# Patient Record
Sex: Female | Born: 1955 | Race: Black or African American | Hispanic: No | Marital: Single | Smoking: Never smoker
Health system: Southern US, Community
[De-identification: ages and names within clinical notes are randomized; demographics above are authoritative.]

## PROBLEM LIST (undated history)

## (undated) DIAGNOSIS — B999 Unspecified infectious disease: Secondary | ICD-10-CM

## (undated) DIAGNOSIS — I10 Essential (primary) hypertension: Secondary | ICD-10-CM

## (undated) DIAGNOSIS — I82409 Acute embolism and thrombosis of unspecified deep veins of unspecified lower extremity: Secondary | ICD-10-CM

## (undated) DIAGNOSIS — Z8269 Family history of other diseases of the musculoskeletal system and connective tissue: Secondary | ICD-10-CM

## (undated) DIAGNOSIS — M1990 Unspecified osteoarthritis, unspecified site: Secondary | ICD-10-CM

## (undated) DIAGNOSIS — E7800 Pure hypercholesterolemia, unspecified: Secondary | ICD-10-CM

## (undated) DIAGNOSIS — G43909 Migraine, unspecified, not intractable, without status migrainosus: Secondary | ICD-10-CM

## (undated) HISTORY — PX: HIP SURGERY: SHX245

## (undated) HISTORY — PX: TOTAL KNEE ARTHROPLASTY: SHX125

---

## 2011-12-17 DIAGNOSIS — M069 Rheumatoid arthritis, unspecified: Secondary | ICD-10-CM | POA: Insufficient documentation

## 2011-12-17 DIAGNOSIS — M353 Polymyalgia rheumatica: Secondary | ICD-10-CM | POA: Insufficient documentation

## 2012-02-14 DIAGNOSIS — J45909 Unspecified asthma, uncomplicated: Secondary | ICD-10-CM | POA: Insufficient documentation

## 2013-11-13 DIAGNOSIS — M329 Systemic lupus erythematosus, unspecified: Secondary | ICD-10-CM | POA: Insufficient documentation

## 2013-12-06 DIAGNOSIS — R531 Weakness: Secondary | ICD-10-CM | POA: Insufficient documentation

## 2013-12-06 DIAGNOSIS — E876 Hypokalemia: Secondary | ICD-10-CM | POA: Insufficient documentation

## 2014-04-09 DIAGNOSIS — Z8619 Personal history of other infectious and parasitic diseases: Secondary | ICD-10-CM | POA: Insufficient documentation

## 2014-05-31 DIAGNOSIS — Z96659 Presence of unspecified artificial knee joint: Secondary | ICD-10-CM

## 2014-05-31 DIAGNOSIS — T8484XA Pain due to internal orthopedic prosthetic devices, implants and grafts, initial encounter: Secondary | ICD-10-CM | POA: Insufficient documentation

## 2014-06-03 DIAGNOSIS — Z96652 Presence of left artificial knee joint: Secondary | ICD-10-CM | POA: Insufficient documentation

## 2014-08-12 DIAGNOSIS — Z86718 Personal history of other venous thrombosis and embolism: Secondary | ICD-10-CM | POA: Insufficient documentation

## 2014-08-12 DIAGNOSIS — L0390 Cellulitis, unspecified: Secondary | ICD-10-CM | POA: Insufficient documentation

## 2014-09-04 DIAGNOSIS — D638 Anemia in other chronic diseases classified elsewhere: Secondary | ICD-10-CM | POA: Insufficient documentation

## 2016-01-12 ENCOUNTER — Other Ambulatory Visit: Payer: Self-pay | Admitting: Family Medicine

## 2016-01-12 DIAGNOSIS — M79605 Pain in left leg: Secondary | ICD-10-CM

## 2016-01-12 DIAGNOSIS — Z1231 Encounter for screening mammogram for malignant neoplasm of breast: Secondary | ICD-10-CM

## 2016-01-12 DIAGNOSIS — M79604 Pain in right leg: Secondary | ICD-10-CM

## 2016-01-14 ENCOUNTER — Ambulatory Visit
Admission: RE | Admit: 2016-01-14 | Discharge: 2016-01-14 | Disposition: A | Discharge status: Home / Self Care | Payer: Medicare Other | Source: Ambulatory Visit | Attending: Family Medicine | Admitting: Family Medicine

## 2016-01-14 DIAGNOSIS — Z1231 Encounter for screening mammogram for malignant neoplasm of breast: Secondary | ICD-10-CM

## 2016-01-14 DIAGNOSIS — M79604 Pain in right leg: Secondary | ICD-10-CM

## 2016-01-14 DIAGNOSIS — M79605 Pain in left leg: Secondary | ICD-10-CM

## 2016-01-29 ENCOUNTER — Ambulatory Visit
Admission: RE | Admit: 2016-01-29 | Discharge: 2016-01-29 | Disposition: A | Discharge status: Home / Self Care | Payer: Medicare Other | Source: Ambulatory Visit | Attending: Family Medicine | Admitting: Family Medicine

## 2016-01-29 DIAGNOSIS — Z1231 Encounter for screening mammogram for malignant neoplasm of breast: Secondary | ICD-10-CM

## 2016-02-10 ENCOUNTER — Encounter (HOSPITAL_COMMUNITY): Payer: Self-pay | Admitting: Emergency Medicine

## 2016-02-10 DIAGNOSIS — Z8619 Personal history of other infectious and parasitic diseases: Secondary | ICD-10-CM | POA: Insufficient documentation

## 2016-02-10 DIAGNOSIS — I10 Essential (primary) hypertension: Secondary | ICD-10-CM | POA: Diagnosis not present

## 2016-02-10 DIAGNOSIS — G43909 Migraine, unspecified, not intractable, without status migrainosus: Secondary | ICD-10-CM | POA: Insufficient documentation

## 2016-02-10 DIAGNOSIS — Z79899 Other long term (current) drug therapy: Secondary | ICD-10-CM | POA: Diagnosis not present

## 2016-02-10 DIAGNOSIS — Z7982 Long term (current) use of aspirin: Secondary | ICD-10-CM | POA: Insufficient documentation

## 2016-02-10 DIAGNOSIS — R600 Localized edema: Secondary | ICD-10-CM | POA: Insufficient documentation

## 2016-02-10 DIAGNOSIS — E7800 Pure hypercholesterolemia, unspecified: Secondary | ICD-10-CM | POA: Diagnosis not present

## 2016-02-10 DIAGNOSIS — M1990 Unspecified osteoarthritis, unspecified site: Secondary | ICD-10-CM | POA: Insufficient documentation

## 2016-02-10 DIAGNOSIS — Z79891 Long term (current) use of opiate analgesic: Secondary | ICD-10-CM | POA: Insufficient documentation

## 2016-02-10 DIAGNOSIS — M79605 Pain in left leg: Secondary | ICD-10-CM | POA: Diagnosis present

## 2016-02-10 DIAGNOSIS — Z86718 Personal history of other venous thrombosis and embolism: Secondary | ICD-10-CM | POA: Insufficient documentation

## 2016-02-10 NOTE — ED Notes (Signed)
Pt c/o L leg pain, hx of infection in leg, arthritis, knee replacement, DVTs in leg. Pt legs are swollen and warm to touch, bruising noted as well. Pt ambulatory, in NAD. Denies CP/SOB

## 2016-02-11 ENCOUNTER — Emergency Department (HOSPITAL_COMMUNITY)
Admission: EM | Admit: 2016-02-11 | Discharge: 2016-02-11 | Disposition: A | Discharge status: Home / Self Care | Payer: Medicare Other | Attending: Emergency Medicine | Admitting: Emergency Medicine

## 2016-02-11 DIAGNOSIS — R600 Localized edema: Secondary | ICD-10-CM

## 2016-02-11 HISTORY — DX: Unspecified osteoarthritis, unspecified site: M19.90

## 2016-02-11 HISTORY — DX: Family history of other diseases of the musculoskeletal system and connective tissue: Z82.69

## 2016-02-11 HISTORY — DX: Essential (primary) hypertension: I10

## 2016-02-11 HISTORY — DX: Migraine, unspecified, not intractable, without status migrainosus: G43.909

## 2016-02-11 HISTORY — DX: Unspecified infectious disease: B99.9

## 2016-02-11 HISTORY — DX: Acute embolism and thrombosis of unspecified deep veins of unspecified lower extremity: I82.409

## 2016-02-11 HISTORY — DX: Pure hypercholesterolemia, unspecified: E78.00

## 2016-02-11 MED ORDER — FUROSEMIDE 20 MG PO TABS: 20.0000 mg | ORAL_TABLET | Freq: Every day | ORAL | Status: DC

## 2016-02-11 NOTE — ED Provider Notes (Signed)
CSN: [id number]     Arrival date & time 02/10/16  2315 History   By signing my name below, I, Diana Sanchez, attest that this documentation has been prepared under the direction and in the presence of Geoffery Lyons, MD.  Electronically Signed: Arlan Sanchez, ED Scribe. 02/11/2016. 3:06 AM.   Chief Complaint  Patient presents with  . Leg Pain   Patient is a 60 y.o. female presenting with leg pain. The history is provided by the patient. No language interpreter was used.  Leg Pain Location:  Leg Time since incident:  1 week Injury: no   Leg location:  L leg and L lower leg Pain details:    Radiates to:  Does not radiate   Severity:  Moderate   Onset quality:  Gradual   Duration:  1 week   Timing:  Constant   Progression:  Worsening Chronicity:  Chronic Relieved by:  Nothing Associated symptoms: no fever     HPI Comments: Diana Sanchez is a 60 y.o. female with a PMHx of DVT, arthritis, and HTN who presents to the Emergency Department complaining of constant, ongoing L leg pain with associated swelling that is chronic is nature but worsened in the last week. Pt also reports discoloration to the L lower extremity. Discomfort to LLE is exacerbated with pressure to leg. No alleviating factors at this time. [name] is currently established with a pain clinic but denies any improvement with current break through pain regimen. No recent fever, chills, chest pain, or shortness of breath. Pt recently had an ultrasound last week without any abnormal findings. PSHx includes bilateral total knee arthroplasty performed in [place], [place] in 2014.  PCP: No primary care provider on file.    Past Medical History  Diagnosis Date  . DVT of lower limb, acute (HCC)   . Arthritis   . FH: total knee replacement   . Infection     in knee  . Hypercholesteremia   . Migraines   . Hypertension    Past Surgical History  Procedure Laterality Date  . Total knee arthroplasty    . Hip surgery    .  Cesarean section     No family history on file. Social History  Substance Use Topics  . Smoking status: Never Smoker   . Smokeless tobacco: None  . Alcohol Use: No   OB History    No data available     Review of Systems  Constitutional: Negative for fever and chills.  Respiratory: Negative for shortness of breath.   Cardiovascular: Positive for leg swelling. Negative for chest pain.  Gastrointestinal: Negative for nausea, vomiting, abdominal pain and diarrhea.  Musculoskeletal: Positive for arthralgias.  Neurological: Negative for weakness, numbness and headaches.  Psychiatric/Behavioral: Negative for confusion.  All other systems reviewed and are negative.     Allergies  Tramadol  Home Medications   Prior to Admission medications   Medication Sig Start Date End Date Taking? Authorizing Provider  amLODipine (NORVASC) 5 MG tablet Take 5 mg by mouth daily.   Yes Historical Provider, MD  aspirin EC 81 MG tablet Take 81 mg by mouth daily.   Yes Historical Provider, MD  cyclobenzaprine (FLEXERIL) 10 MG tablet Take 10 mg by mouth 3 (three) times daily as needed for muscle spasms.   Yes Historical Provider, MD  oxyCODONE-acetaminophen (PERCOCET/ROXICET) 5-325 MG tablet Take 1 tablet by mouth 3 (three) times daily.   Yes Historical Provider, MD  pravastatin (PRAVACHOL) 40 MG tablet Take 40 mg  by mouth daily.   Yes Historical Provider, MD  rizatriptan (MAXALT) 10 MG tablet Take 10 mg by mouth as needed for migraine. May repeat in 2 hours if needed   Yes Historical Provider, MD   Triage Vitals: BP 145/82 mmHg  Pulse 92  Temp(Src) 98 F (36.7 C) (Oral)  Resp 18  SpO2 100%   Physical Exam  Constitutional: She is oriented to person, place, and time. She appears well-developed and well-nourished. No distress.  HENT:  Head: Normocephalic and atraumatic.  Eyes: EOM are normal.  Neck: Normal range of motion.  Cardiovascular: Normal rate, regular rhythm and normal heart sounds.    Pulmonary/Chest: Effort normal and breath sounds normal.  Abdominal: Soft. She exhibits no distension. There is no tenderness.  Musculoskeletal: Normal range of motion. She exhibits edema.  2 plus pitting edema of bilateral lower extremities. DP and PT pulses are easily palpable.  Neurological: She is alert and oriented to person, place, and time.  Skin: Skin is warm and dry.  Psychiatric: She has a normal mood and affect. Judgment normal.  Nursing note and vitals reviewed.   ED Course  Procedures (including critical care time)  DIAGNOSTIC STUDIES: Oxygen Saturation is 100% on RA, Normal by my interpretation.    COORDINATION OF CARE: 2:56 AM-Discussed treatment plan with pt at bedside and pt agreed to plan.     Labs Review Labs Reviewed - No data to display  Imaging Review No results found. I have personally reviewed and evaluated these images and lab results as part of my medical decision-making.   EKG Interpretation None      MDM   Final diagnoses:  None    Patient presents with complaints of leg swelling and pain. She has a history of an infected prosthetic knee that required removal and washout. She reports she became septic during that process and had a lengthy hospitalization. She presents now with increased swelling to her left greater than right leg. She recently had an ultrasound performed for similar complaints which showed no evidence for DVT. She has 2+ pitting edema of her legs and I suspect a dependent edema. She is not having any shortness of breath and her lungs are clear. She will be treated with a low dose of Lasix and follow-up with her primary Dr./orthopedist.  I personally performed the services described in this documentation, which was scribed in my presence. The recorded information has been reviewed and is accurate.       Geoffery Lyons, MD 02/11/16 [phone]

## 2016-02-11 NOTE — Discharge Instructions (Signed)
Lasix as prescribed.  Please follow-up with your primary Dr. and orthopedic surgeon in the next week.   Edema Edema is an abnormal buildup of fluids in your bodytissues. Edema is somewhatdependent on gravity to pull the fluid to the lowest place in your body. That makes the condition more common in the legs and thighs (lower extremities). Painless swelling of the feet and ankles is common and becomes more likely as you get older. It is also common in looser tissues, like around your eyes.  When the affected area is squeezed, the fluid may move out of that spot and leave a dent for a few moments. This dent is called pitting.  CAUSES  There are many possible causes of edema. Eating too much salt and being on your feet or sitting for a long time can cause edema in your legs and ankles. Hot weather may make edema worse. Common medical causes of edema include:  Heart failure.  Liver disease.  Kidney disease.  Weak blood vessels in your legs.  Cancer.  An injury.  Pregnancy.  Some medications.  Obesity. SYMPTOMS  Edema is usually painless.Your skin may look swollen or shiny.  DIAGNOSIS  Your health care provider may be able to diagnose edema by asking about your medical history and doing a physical exam. You may need to have tests such as X-rays, an electrocardiogram, or blood tests to check for medical conditions that may cause edema.  TREATMENT  Edema treatment depends on the cause. If you have heart, liver, or kidney disease, you need the treatment appropriate for these conditions. General treatment may include:  Elevation of the affected body part above the level of your heart.  Compression of the affected body part. Pressure from elastic bandages or support stockings squeezes the tissues and forces fluid back into the blood vessels. This keeps fluid from entering the tissues.  Restriction of fluid and salt intake.  Use of a water pill (diuretic). These medications are  appropriate only for some types of edema. They pull fluid out of your body and make you urinate more often. This gets rid of fluid and reduces swelling, but diuretics can have side effects. Only use diuretics as directed by your health care provider. HOME CARE INSTRUCTIONS   Keep the affected body part above the level of your heart when you are lying down.   Do not sit still or stand for prolonged periods.   Do not put anything directly under your knees when lying down.  Do not wear constricting clothing or garters on your upper legs.   Exercise your legs to work the fluid back into your blood vessels. This may help the swelling go down.   Wear elastic bandages or support stockings to reduce ankle swelling as directed by your health care provider.   Eat a low-salt diet to reduce fluid if your health care provider recommends it.   Only take medicines as directed by your health care provider. SEEK MEDICAL CARE IF:   Your edema is not responding to treatment.  You have heart, liver, or kidney disease and notice symptoms of edema.  You have edema in your legs that does not improve after elevating them.   You have sudden and unexplained weight gain. SEEK IMMEDIATE MEDICAL CARE IF:   You develop shortness of breath or chest pain.   You cannot breathe when you lie down.  You develop pain, redness, or warmth in the swollen areas.   You have heart, liver, or kidney  disease and suddenly get edema.  You have a fever and your symptoms suddenly get worse. MAKE SURE YOU:   Understand these instructions.  Will watch your condition.  Will get help right away if you are not doing well or get worse.   This information is not intended to replace advice given to you by your health care provider. Make sure you discuss any questions you have with your health care provider.   Document Released: 11/29/2005 Document Revised: 12/20/2014 Document Reviewed: 09/21/2013 Elsevier  Interactive Patient Education [employer]2016 Elsevier Inc.

## 2016-07-30 DIAGNOSIS — H269 Unspecified cataract: Secondary | ICD-10-CM | POA: Insufficient documentation

## 2016-10-19 DIAGNOSIS — M170 Bilateral primary osteoarthritis of knee: Secondary | ICD-10-CM | POA: Insufficient documentation

## 2016-10-19 DIAGNOSIS — M5136 Other intervertebral disc degeneration, lumbar region: Secondary | ICD-10-CM | POA: Insufficient documentation

## 2016-10-19 DIAGNOSIS — M47816 Spondylosis without myelopathy or radiculopathy, lumbar region: Secondary | ICD-10-CM | POA: Insufficient documentation

## 2016-10-19 DIAGNOSIS — G894 Chronic pain syndrome: Secondary | ICD-10-CM | POA: Insufficient documentation

## 2016-10-19 DIAGNOSIS — M130 Polyarthritis, unspecified: Secondary | ICD-10-CM | POA: Insufficient documentation

## 2017-01-10 ENCOUNTER — Other Ambulatory Visit: Payer: Self-pay | Admitting: Family Medicine

## 2017-01-10 DIAGNOSIS — Z1231 Encounter for screening mammogram for malignant neoplasm of breast: Secondary | ICD-10-CM

## 2017-02-04 ENCOUNTER — Ambulatory Visit
Admission: RE | Admit: 2017-02-04 | Discharge: 2017-02-04 | Disposition: A | Discharge status: Home / Self Care | Payer: Medicare HMO | Source: Ambulatory Visit | Attending: Family Medicine | Admitting: Family Medicine

## 2017-02-04 DIAGNOSIS — Z1231 Encounter for screening mammogram for malignant neoplasm of breast: Secondary | ICD-10-CM

## 2017-04-13 DIAGNOSIS — G43909 Migraine, unspecified, not intractable, without status migrainosus: Secondary | ICD-10-CM | POA: Insufficient documentation

## 2018-03-03 ENCOUNTER — Other Ambulatory Visit: Payer: Self-pay | Admitting: Family Medicine

## 2018-03-03 DIAGNOSIS — Z139 Encounter for screening, unspecified: Secondary | ICD-10-CM

## 2018-03-22 ENCOUNTER — Ambulatory Visit: Payer: Medicare HMO

## 2018-03-24 ENCOUNTER — Ambulatory Visit: Payer: Medicare HMO

## 2018-04-26 ENCOUNTER — Ambulatory Visit: Payer: Medicare HMO

## 2018-06-13 DIAGNOSIS — Z8614 Personal history of Methicillin resistant Staphylococcus aureus infection: Secondary | ICD-10-CM | POA: Insufficient documentation

## 2018-06-20 ENCOUNTER — Ambulatory Visit: Payer: Medicare HMO

## 2018-06-26 DIAGNOSIS — F0631 Mood disorder due to known physiological condition with depressive features: Secondary | ICD-10-CM | POA: Insufficient documentation

## 2018-08-01 DIAGNOSIS — H9193 Unspecified hearing loss, bilateral: Secondary | ICD-10-CM | POA: Insufficient documentation

## 2019-01-03 ENCOUNTER — Ambulatory Visit: Payer: Medicare HMO | Admitting: Podiatry

## 2019-01-10 DIAGNOSIS — L97929 Non-pressure chronic ulcer of unspecified part of left lower leg with unspecified severity: Secondary | ICD-10-CM

## 2019-01-10 DIAGNOSIS — I83892 Varicose veins of left lower extremities with other complications: Secondary | ICD-10-CM | POA: Insufficient documentation

## 2019-01-10 DIAGNOSIS — I83029 Varicose veins of left lower extremity with ulcer of unspecified site: Secondary | ICD-10-CM | POA: Insufficient documentation

## 2019-01-10 DIAGNOSIS — R609 Edema, unspecified: Secondary | ICD-10-CM

## 2019-01-26 ENCOUNTER — Encounter

## 2019-01-26 ENCOUNTER — Other Ambulatory Visit: Payer: Self-pay

## 2019-01-26 ENCOUNTER — Ambulatory Visit (INDEPENDENT_AMBULATORY_CARE_PROVIDER_SITE_OTHER): Payer: Medicare HMO | Admitting: Podiatry

## 2019-01-26 ENCOUNTER — Encounter: Payer: Self-pay | Admitting: Podiatry

## 2019-01-26 VITALS — BP 95/65 | HR 87

## 2019-01-26 DIAGNOSIS — K219 Gastro-esophageal reflux disease without esophagitis: Secondary | ICD-10-CM | POA: Insufficient documentation

## 2019-01-26 DIAGNOSIS — M79674 Pain in right toe(s): Secondary | ICD-10-CM

## 2019-01-26 DIAGNOSIS — M2041 Other hammer toe(s) (acquired), right foot: Secondary | ICD-10-CM

## 2019-01-26 DIAGNOSIS — I872 Venous insufficiency (chronic) (peripheral): Secondary | ICD-10-CM | POA: Diagnosis not present

## 2019-01-26 DIAGNOSIS — L84 Corns and callosities: Secondary | ICD-10-CM

## 2019-01-26 DIAGNOSIS — M2042 Other hammer toe(s) (acquired), left foot: Secondary | ICD-10-CM

## 2019-01-26 DIAGNOSIS — B351 Tinea unguium: Secondary | ICD-10-CM

## 2019-01-26 DIAGNOSIS — M79675 Pain in left toe(s): Secondary | ICD-10-CM | POA: Diagnosis not present

## 2019-01-26 DIAGNOSIS — F329 Major depressive disorder, single episode, unspecified: Secondary | ICD-10-CM | POA: Insufficient documentation

## 2019-01-26 DIAGNOSIS — F32A Depression, unspecified: Secondary | ICD-10-CM | POA: Insufficient documentation

## 2019-01-26 NOTE — Patient Instructions (Signed)

## 2019-01-26 NOTE — Progress Notes (Signed)
Subjective: Diana Sanchez presents today referred by Helane Rima, MD with cc of painful, discolored, thick toenails which interfere with daily activities.  Pain is aggravated when wearing enclosed shoe gear.  Patient has h/o venous stasis ulcer and is currently being treated at the [facility].   Past Medical History:  Diagnosis Date  . Arthritis   . DVT of lower limb, acute ([place])   . FH: total knee replacement   . Hypercholesteremia   . Hypertension   . Infection    in knee  . Migraines      Patient Active Problem List   Diagnosis Date Noted  . Depression 01/26/2019  . GERD (gastroesophageal reflux disease) 01/26/2019  . Venous stasis ulcer of left lower leg with edema of left lower leg ([place]) 01/10/2019  . Bilateral hearing loss 08/01/2018  . 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 Personal history of MRSA (methicillin resistant Staphylococcus aureus) 06/13/2018  . Migraine without status migrainosus, not intractable 04/13/2017  . DDD (degenerative disc disease), lumbar 10/19/2016  . Lumbar facet arthropathy 10/19/2016  . Osteoarthritis of both knees 10/19/2016  . 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 Chronic pain syndrome 10/19/2016  . Cataract of both eyes 07/30/2016  . Anemia of chronic disease 09/04/2014  . History of DVT (deep vein thrombosis) 08/12/2014  . 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 Generalized weakness 12/06/2013  . SLE (systemic lupus erythematosus) ([place]) 11/13/2013  . Asthma 02/14/2012  . Rheumatoid arthritis ([place]) 12/17/2011  . Polymyalgia rheumatica ([place]) 12/17/2011     Past Surgical History:  Procedure Laterality Date  . CESAREAN SECTION    . HIP SURGERY    . TOTAL KNEE ARTHROPLASTY        Current Outpatient Medications:  .  albuterol (PROVENTIL HFA;VENTOLIN HFA) 108 (90 Base) MCG/ACT inhaler, Inhale into the lungs., Disp: ,  Rfl:  .  amLODipine (NORVASC) 5 MG tablet, Take 5 mg by mouth daily., Disp: , Rfl:  .  aspirin EC 81 MG tablet, Take 81 mg by mouth daily., Disp: , Rfl:  .  buprenorphine (BUTRANS) 10 MCG/HR PTWK patch, , Disp: , Rfl:  .  cyclobenzaprine (FLEXERIL) 10 MG tablet, Take 10 mg by mouth 3 (three) times daily as needed for muscle spasms., Disp: , Rfl:  .  ferrous sulfate 325 (65 FE) MG tablet, Take by mouth., Disp: , Rfl:  .  furosemide (LASIX) 20 MG tablet, Take 1 tablet (20 mg total) by mouth daily., Disp: 30 tablet, Rfl: 0 .  gabapentin (NEURONTIN) 100 MG capsule, , Disp: , Rfl:  .  hydrOXYzine (ATARAX/VISTARIL) 10 MG tablet, , Disp: , Rfl:  .  LINZESS 72 MCG capsule, , Disp: , Rfl:  .  mupirocin ointment (BACTROBAN) 2 %, , Disp: , Rfl:  .  NARCAN 4 MG/0.1ML LIQD nasal spray kit, , Disp: , Rfl:  .  ondansetron (ZOFRAN) 4 MG tablet, , Disp: , Rfl:  .  oxyCODONE-acetaminophen (PERCOCET/ROXICET) 5-325 MG tablet, Take 1 tablet by mouth 3 (three) times daily., Disp: , Rfl:  .  pravastatin (PRAVACHOL) 40 MG tablet, Take 40 mg by mouth daily., Disp: , Rfl:  .  pregabalin (LYRICA) 50 MG capsule, Take by mouth., Disp: , Rfl:  .  pregabalin (LYRICA) 50 MG capsule, , Disp: , Rfl:  .  rizatriptan (MAXALT) 10 MG tablet, Take 10 mg by mouth as needed for migraine. May repeat in  2 hours if needed, Disp: , Rfl:  .  SANTYL ointment, , Disp: , Rfl:  .  sulfamethoxazole-trimethoprim (BACTRIM DS,SEPTRA DS) 800-160 MG tablet, , Disp: , Rfl:  .  traZODone (DESYREL) 50 MG tablet, , Disp: , Rfl:    Allergies  Allergen Reactions  . Tramadol     nausea     Social History   Occupational History  . Not on file  Tobacco Use  . Smoking status: Never Smoker  Substance and Sexual Activity  . Alcohol use: No  . Drug use: Not on file  . Sexual activity: Not on file     History reviewed. No pertinent family history.   Immunization History  Administered Date(s) Administered  . PPD Test 05/26/2013, 06/04/2014,  06/17/2014  . Pneumococcal Polysaccharide-23 08/11/2014  . Tdap 05/13/2015     Review of systems: Positive Findings in bold print.  Constitutional:  chills, fatigue, fever, sweats, weight change Communication: Optometrist, sign Ecologist, hand writing, iPad/Android device Head: headaches, head injury Eyes: changes in vision, eye pain, glaucoma, cataracts, macular degeneration, diplopia, glare,  light sensitivity, eyeglasses or contacts, blindness Ears nose mouth throat: Hard of hearing, ringing in ears, deaf, sign language,  vertigo,   nosebleeds,  rhinitis,  cold sores, snoring, swollen glands Cardiovascular: HTN, edema, arrhythmia, pacemaker in place, defibrillator in place,  chest pain/tightness, chronic anticoagulation, blood clot, heart failure Peripheral Vascular: leg cramps, varicose veins, blood clots, lymphedema Respiratory:  difficulty breathing, denies congestion, SOB, wheezing, cough, emphysema Gastrointestinal: change in appetite or weight, abdominal pain, constipation, diarrhea, nausea, vomiting, vomiting blood, change in bowel habits, abdominal pain, jaundice, rectal bleeding, hemorrhoids, GERD Genitourinary:  nocturia,  pain on urination,  blood in urine, Foley catheter, urinary urgency Musculoskeletal: uses mobility aid,  cramping, stiff joints, painful joints, decreased joint motion, fractures, OA, gout Skin: +changes in toenails, color change, dryness, itching, mole changes,  Rash, wound right leg Neurological: headaches, numbness in feet, paresthesias in feet, burning in feet, fainting,  seizures, change in speech. denies headaches, memory problems/poor historian, cerebral palsy, weakness, paralysis Endocrine: diabetes, hypothyroidism, hyperthyroidism,  goiter, dry mouth, flushing, heat intolerance,  cold intolerance,  excessive thirst, denies polyuria,  nocturia Hematological:  easy bleeding, excessive bleeding, easy bruising, enlarged lymph nodes, on long term  blood thinner, history of past transusions Allergy/immunological:  hives, eczema, frequent infections, multiple drug allergies, seasonal allergies, transplant recipient Psychiatric:  anxiety, depression, mood disorder, suicidal ideations, hallucinations   Objective: Vascular Examination: Capillary refill time <3 seconds x 10 digits  Dorsalis pedis 1/4 b/l  Posterior tibial pulses 2/4 b/l  No digital hair x 10 digits  Skin temperature gradient WNL b/l  Dermatological Examination: Skin changes consistent with chronic venous insufficiency.  Leg wrap noted LLE which is clean, dry, and intact.  Toenails 1-5 b/l discolored, thick, dystrophic with subungual debris and pain with palpation to nailbeds due to thickness of nails.  Hyperkeratotic lesion dorsal 5th PIPJ right foot  Musculoskeletal: Muscle strength 5/5 to all LE muscle groups  HAV with bunion b/l  Hammertoes 2-5 b/l  Neurological: Sensation intact with 10 gram monofilament  Vibratory sensation intact.  Assessment: 1. Painful onychomycosis toenails 1-5 b/l  2. Corn right 5th digit PIPJ 3. Hammertoe deformity 2-5 b/l   Plan: Discussed onychomycosis and treatment options.  Literature dispensed on today. 1. Compounded Toenail Antifungal will be ordered and mailed to patient's home from [facility]. The medication consist of terbinafine 3%, fluconazole 2%, tea tree oil 5%, urea 10%, ibuprofen 2%  and DMSO suspension. Apply to toenails at bedtime. 2. Toenails 1-5 b/l were debrided in length and girth without iatrogenic bleeding. 3. Corn right 5th digit pared without incident.  4. Patient to continue soft, supportive shoe gear. 5. Patient to report any pedal injuries to medical professional immediately. 6. Follow up 3 months.  7. Patient/POA to call should there be a concern in the interim.

## 2019-01-29 ENCOUNTER — Encounter: Payer: Self-pay | Admitting: Podiatry

## 2019-02-20 DIAGNOSIS — I872 Venous insufficiency (chronic) (peripheral): Secondary | ICD-10-CM | POA: Insufficient documentation

## 2019-02-20 DIAGNOSIS — I83009 Varicose veins of unspecified lower extremity with ulcer of unspecified site: Secondary | ICD-10-CM | POA: Insufficient documentation

## 2019-04-02 ENCOUNTER — Other Ambulatory Visit: Payer: Self-pay | Admitting: Nurse Practitioner

## 2019-04-02 DIAGNOSIS — Z1231 Encounter for screening mammogram for malignant neoplasm of breast: Secondary | ICD-10-CM

## 2019-04-27 ENCOUNTER — Other Ambulatory Visit: Payer: Self-pay

## 2019-04-27 ENCOUNTER — Ambulatory Visit (INDEPENDENT_AMBULATORY_CARE_PROVIDER_SITE_OTHER): Payer: Medicare HMO | Admitting: Podiatry

## 2019-04-27 ENCOUNTER — Encounter: Payer: Self-pay | Admitting: Podiatry

## 2019-04-27 VITALS — Temp 96.3°F

## 2019-04-27 DIAGNOSIS — L84 Corns and callosities: Secondary | ICD-10-CM | POA: Diagnosis not present

## 2019-04-27 DIAGNOSIS — M79674 Pain in right toe(s): Secondary | ICD-10-CM

## 2019-04-27 DIAGNOSIS — B351 Tinea unguium: Secondary | ICD-10-CM

## 2019-04-27 DIAGNOSIS — M79675 Pain in left toe(s): Secondary | ICD-10-CM | POA: Diagnosis not present

## 2019-04-27 MED ORDER — CICLOPIROX 8 % EX SOLN: CUTANEOUS | 11 refills | Status: DC

## 2019-04-27 NOTE — Patient Instructions (Signed)

## 2019-05-07 ENCOUNTER — Encounter: Payer: Self-pay | Admitting: Podiatry

## 2019-05-07 NOTE — Progress Notes (Signed)
Subjective: Diana Sanchez presents today with painful, thick toenails 1-5 b/l that she cannot cut and which interfere with daily activities.  Pain is aggravated when wearing enclosed shoe gear.  Diana Dopp, MD is her PCP. Last visit was 6 months ago.  [name] relates medication prescribed for fungal nails on last visit was not covered by her insurance. She states she would like another prescription her insurance would cover.   Allergies  Allergen Reactions  . Tramadol     nausea    Objective: Vitals:   04/27/19 0943  Temp: (!) 96.3 F (35.7 C)  Vascular Examination: Capillary refill time <3 seconds  x 10 digits.  Dorsalis pedis and Posterior tibial pulses palpable b/l.  Digital hair absent x 10 digits.  Skin temperature gradient WNL b/l.  Dermatological Examination: Skin changes consistent with chronic venous insufficiency.  Toenails 1-5 b/l discolored, thick, dystrophic with subungual debris and pain with palpation to nailbeds due to thickness of nails.  Hyperkeratotic lesion dorsal 5th PIPJ right foot.  Musculoskeletal: Muscle strength 5/5 to all LE muscle groups  HAV with bunion deformity b/l.  Hammertoes 2-5 b/l.  Neurological: Sensation intact with 10 gram monofilament.  Vibratory sensation intact.  Assessment: 1. Painful onychomycosis toenails 1-5 b/l  2. Corn right 5th digit 3. Hammertoes 2-5 b/l  Plan: 1. Toenails 1-5 b/l were debrided in length and girth without iatrogenic bleeding. 2. Rx written for Penlac Nail Lacquer 8% to be applied to affected toenails once daily for 48 weeks and removed once weekly with nail polis remover. Corn(s) pared right 5th digit utilizing sterile scalpel blade without incident. Patient to continue soft, supportive shoe gear daily. Patient to report any pedal injuries to medical professional immediately. Follow up 3 months.  Patient/POA to call should there be a concern in the interim.

## 2019-06-04 ENCOUNTER — Ambulatory Visit: Payer: Medicare HMO

## 2019-07-03 DIAGNOSIS — L97322 Non-pressure chronic ulcer of left ankle with fat layer exposed: Secondary | ICD-10-CM | POA: Insufficient documentation

## 2019-07-30 ENCOUNTER — Encounter: Payer: Self-pay | Admitting: Podiatry

## 2019-07-30 ENCOUNTER — Other Ambulatory Visit: Payer: Self-pay

## 2019-07-30 ENCOUNTER — Ambulatory Visit (INDEPENDENT_AMBULATORY_CARE_PROVIDER_SITE_OTHER): Payer: Medicare HMO | Admitting: Podiatry

## 2019-07-30 VITALS — Temp 97.9°F

## 2019-07-30 DIAGNOSIS — B351 Tinea unguium: Secondary | ICD-10-CM | POA: Diagnosis not present

## 2019-07-30 DIAGNOSIS — M79675 Pain in left toe(s): Secondary | ICD-10-CM | POA: Diagnosis not present

## 2019-07-30 DIAGNOSIS — M79674 Pain in right toe(s): Secondary | ICD-10-CM

## 2019-07-30 DIAGNOSIS — L84 Corns and callosities: Secondary | ICD-10-CM

## 2019-07-30 NOTE — Patient Instructions (Signed)
Corns and Calluses Corns are small areas of thickened skin that occur on the top, sides, or tip of a toe. They contain a cone-shaped core with a point that can press on a nerve below. This causes pain.  Calluses are areas of thickened skin that can occur anywhere on the body, including the hands, fingers, palms, soles of the feet, and heels. Calluses are usually larger than corns. What are the causes? Corns and calluses are caused by rubbing (friction) or pressure, such as from shoes that are too tight or do not fit properly. What increases the risk? Corns are more likely to develop in people who have misshapen toes (toe deformities), such as hammer toes. Calluses can occur with friction to any area of the skin. They are more likely to develop in people who:  Work with their hands.  Wear shoes that fit poorly, are too tight, or are high-heeled.  Have toe deformities. What are the signs or symptoms? Symptoms of a corn or callus include:  A hard growth on the skin.  Pain or tenderness under the skin.  Redness and swelling.  Increased discomfort while wearing tight-fitting shoes, if your feet are affected. If a corn or callus becomes infected, symptoms may include:  Redness and swelling that gets worse.  Pain.  Fluid, blood, or pus draining from the corn or callus. How is this diagnosed? Corns and calluses may be diagnosed based on your symptoms, your medical history, and a physical exam. How is this treated? Treatment for corns and calluses may include:  Removing the cause of the friction or pressure. This may involve: ? Changing your shoes. ? Wearing shoe inserts (orthotics) or other protective layers in your shoes, such as a corn pad. ? Wearing gloves.  Applying medicine to the skin (topical medicine) to help soften skin in the hardened, thickened areas.  Removing layers of dead skin with a file to reduce the size of the corn or callus.  Removing the corn or callus with a  scalpel or laser.  Taking antibiotic medicines, if your corn or callus is infected.  Having surgery, if a toe deformity is the cause. Follow these instructions at home:   Take over-the-counter and prescription medicines only as told by your health care provider.  If you were prescribed an antibiotic, take it as told by your health care provider. Do not stop taking it even if your condition starts to improve.  Wear shoes that fit well. Avoid wearing high-heeled shoes and shoes that are too tight or too loose.  Wear any padding, protective layers, gloves, or orthotics as told by your health care provider.  Soak your hands or feet and then use a file or pumice stone to soften your corn or callus. Do this as told by your health care provider.  Check your corn or callus every day for symptoms of infection. Contact a health care provider if you:  Notice that your symptoms do not improve with treatment.  Have redness or swelling that gets worse.  Notice that your corn or callus becomes painful.  Have fluid, blood, or pus coming from your corn or callus.  Have new symptoms. Summary  Corns are small areas of thickened skin that occur on the top, sides, or tip of a toe.  Calluses are areas of thickened skin that can occur anywhere on the body, including the hands, fingers, palms, and soles of the feet. Calluses are usually larger than corns.  Corns and calluses are caused by   rubbing (friction) or pressure, such as from shoes that are too tight or do not fit properly.  Treatment may include wearing any padding, protective layers, gloves, or orthotics as told by your health care provider. This information is not intended to replace advice given to you by your health care provider. Make sure you discuss any questions you have with your health care provider. Document Released: 09/04/2004 Document Revised: 03/21/2019 Document Reviewed: 10/12/2017 Elsevier Patient Education  2020 [employer]   Onychomycosis/Fungal Toenails  WHAT IS IT? An infection that lies within the keratin of your nail plate that is caused by a fungus.  WHY ME? Fungal infections affect all ages, sexes, races, and creeds.  There may be many factors that predispose you to a fungal infection such as age, coexisting medical conditions such as diabetes, or an autoimmune disease; stress, medications, fatigue, genetics, etc.  Bottom line: fungus thrives in a warm, moist environment and your shoes offer such a location.  IS IT CONTAGIOUS? Theoretically, yes.  You do not want to share shoes, nail clippers or files with someone who has fungal toenails.  Walking around barefoot in the same room or sleeping in the same bed is unlikely to transfer the organism.  It is important to realize, however, that fungus can spread easily from one nail to the next on the same foot.  HOW DO WE TREAT THIS?  There are several ways to treat this condition.  Treatment may depend on many factors such as age, medications, pregnancy, liver and kidney conditions, etc.  It is best to ask your doctor which options are available to you.  1. No treatment.   Unlike many other medical concerns, you can live with this condition.  However for many people this can be a painful condition and may lead to ingrown toenails or a bacterial infection.  It is recommended that you keep the nails cut short to help reduce the amount of fungal nail. 2. Topical treatment.  These range from herbal remedies to prescription strength nail lacquers.  About 40-50% effective, topicals require twice daily application for approximately 9 to 12 months or until an entirely new nail has grown out.  The most effective topicals are medical grade medications available through physicians offices. 3. Oral antifungal medications.  With an 80-90% cure rate, the most common oral medication requires 3 to 4 months of therapy and stays in your system for a year as the new nail grows out.   Oral antifungal medications do require blood work to make sure it is a safe drug for you.  A liver function panel will be performed prior to starting the medication and after the first month of treatment.  It is important to have the blood work performed to avoid any harmful side effects.  In general, this medication safe but blood work is required. 4. Laser Therapy.  This treatment is performed by applying a specialized laser to the affected nail plate.  This therapy is noninvasive, fast, and non-painful.  It is not covered by insurance and is therefore, out of pocket.  The results have been very good with a 80-95% cure rate.  [facility] is the only practice in the area to offer this therapy. 5. Permanent Nail Avulsion.  Removing the entire nail so that a new nail will not grow back. 

## 2019-08-01 ENCOUNTER — Other Ambulatory Visit: Payer: Self-pay

## 2019-08-01 ENCOUNTER — Ambulatory Visit
Admission: RE | Admit: 2019-08-01 | Discharge: 2019-08-01 | Disposition: A | Discharge status: Home / Self Care | Payer: Medicare HMO | Source: Ambulatory Visit | Attending: Nurse Practitioner | Admitting: Nurse Practitioner

## 2019-08-01 DIAGNOSIS — Z1231 Encounter for screening mammogram for malignant neoplasm of breast: Secondary | ICD-10-CM

## 2019-08-03 ENCOUNTER — Other Ambulatory Visit: Payer: Self-pay | Admitting: Nurse Practitioner

## 2019-08-03 DIAGNOSIS — R928 Other abnormal and inconclusive findings on diagnostic imaging of breast: Secondary | ICD-10-CM

## 2019-08-08 NOTE — Progress Notes (Signed)
Subjective:  Diana Sanchez presents to clinic today with cc of  painful, thick, discolored, elongated toenails 1-5 b/l that become tender and cannot cut because of thickness.  Pain is aggravated when wearing enclosed shoe gear.  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  Helane Rima, MD is her PCP. She voices no new pedal concerns on today's visit.  She is being seen at [facility] for chronic ulceration left LE. Last visit 07/30/2019 for venous stasis ulceration. She is receiving weekly dressing changes with compression dressing.   Current Outpatient Medications:  .  albuterol (PROVENTIL HFA;VENTOLIN HFA) 108 (90 Base) MCG/ACT inhaler, Inhale into the lungs., Disp: , Rfl:  .  amLODipine (NORVASC) 5 MG tablet, Take 5 mg by mouth daily., Disp: , Rfl:  .  aspirin EC 81 MG tablet, Take 81 mg by mouth daily., Disp: , Rfl:  .  buprenorphine (BUTRANS) 10 MCG/HR PTWK patch, , Disp: , Rfl:  .  ciclopirox (PENLAC) 8 % solution, Apply one coat to each toenail daily. Remove weekly with polish remover., Disp: 6.6 mL, Rfl: 11 .  cyclobenzaprine (FLEXERIL) 10 MG tablet, Take 10 mg by mouth 3 (three) times daily as needed for muscle spasms., Disp: , Rfl:  .  ferrous sulfate 325 (65 FE) MG tablet, Take by mouth., Disp: , Rfl:  .  furosemide (LASIX) 20 MG tablet, Take 1 tablet (20 mg total) by mouth daily., Disp: 30 tablet, Rfl: 0 .  gabapentin (NEURONTIN) 100 MG capsule, , Disp: , Rfl:  .  hydrOXYzine (ATARAX/VISTARIL) 10 MG tablet, , Disp: , Rfl:  .  LINZESS 72 MCG capsule, , Disp: , Rfl:  .  mupirocin ointment (BACTROBAN) 2 %, , Disp: , Rfl:  .  NARCAN 4 MG/0.1ML LIQD nasal spray kit, , Disp: , Rfl:  .  NON FORMULARY, [facility] antifungal cream, Disp: , Rfl:  .  ondansetron (ZOFRAN) 4 MG tablet, , Disp: , Rfl:  .  oxyCODONE-acetaminophen (PERCOCET/ROXICET) 5-325 MG tablet, Take 1 tablet by mouth 3 (three) times daily., Disp: , Rfl:   .  pravastatin (PRAVACHOL) 40 MG tablet, Take 40 mg by mouth daily., Disp: , Rfl:  .  predniSONE (DELTASONE) 10 MG tablet, Take by mouth., Disp: , Rfl:  .  pregabalin (LYRICA) 50 MG capsule, Take by mouth., Disp: , Rfl:  .  pregabalin (LYRICA) 50 MG capsule, , Disp: , Rfl:  .  rizatriptan (MAXALT) 10 MG tablet, Take 10 mg by mouth as needed for migraine. May repeat in 2 hours if needed, Disp: , Rfl:  .  SANTYL ointment, , Disp: , Rfl:  .  sulfamethoxazole-trimethoprim (BACTRIM DS,SEPTRA DS) 800-160 MG tablet, , Disp: , Rfl:  .  traZODone (DESYREL) 50 MG tablet, , Disp: , Rfl:    Allergies  Allergen Reactions  . Tramadol     nausea     Objective: Vitals:   07/30/19 0944  Temp: 97.9 F (36.6 C)    Physical Examination:  Vascular Examination: Capillary refill time <3 seconds x 10 digits.  Palpable DP/PT pulses b/l.  Digital hair present b/l.  No edema noted b/l.  Skin temperature gradient WNL b/l.  Dermatological Examination: Skin changes consistent with chronic venous insufficiency b/l.  Dressing left leg is clean, dry and intact left LE.  Hyperkeratotic lesion right 5th digit. No erythema, no edema, no drainage, no flocculence noted.  No interdigital macerations noted b/l.  Elongated, thick, discolored brittle toenails with subungual debris  and pain on dorsal palpation of nailbeds 1-5 b/l.  Musculoskeletal Examination: Muscle strength 5/5 to all muscle groups b/l.  HAV with bunion b/l. Hammertoe 2-5 b/l.  No pain, crepitus or joint discomfort with active/passive ROM.  Neurological Examination: Sensation intact 5/5 b/l with 10 gram monofilament.  Vibratory sensation intact b/l.  Proprioceptive sensation intact b/l.  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 Toenails 1-5 b/l were debrided in length and girth without iatrogenic laceration. Continue [place] Nail Lacquer daily.  2.  Continue soft, supportive shoe  gear daily. 3.  Report any pedal injuries to medical professional. 4.  Follow up 3 months. 5.  Patient/POA to call should there be a question/concern in there interim.

## 2019-08-10 ENCOUNTER — Ambulatory Visit
Admission: RE | Admit: 2019-08-10 | Discharge: 2019-08-10 | Disposition: A | Discharge status: Home / Self Care | Payer: Medicare HMO | Source: Ambulatory Visit | Attending: Nurse Practitioner | Admitting: Nurse Practitioner

## 2019-08-10 ENCOUNTER — Other Ambulatory Visit: Payer: Self-pay | Admitting: Nurse Practitioner

## 2019-08-10 ENCOUNTER — Other Ambulatory Visit: Payer: Self-pay

## 2019-08-10 DIAGNOSIS — R928 Other abnormal and inconclusive findings on diagnostic imaging of breast: Secondary | ICD-10-CM

## 2019-08-10 DIAGNOSIS — R599 Enlarged lymph nodes, unspecified: Secondary | ICD-10-CM

## 2019-08-21 ENCOUNTER — Other Ambulatory Visit: Payer: Self-pay

## 2019-08-21 DIAGNOSIS — B351 Tinea unguium: Secondary | ICD-10-CM

## 2019-08-21 MED ORDER — CICLOPIROX 8 % EX SOLN: CUTANEOUS | 11 refills | Status: DC

## 2019-08-24 DIAGNOSIS — R928 Other abnormal and inconclusive findings on diagnostic imaging of breast: Secondary | ICD-10-CM | POA: Insufficient documentation

## 2019-08-27 DIAGNOSIS — M7989 Other specified soft tissue disorders: Secondary | ICD-10-CM | POA: Insufficient documentation

## 2019-08-27 DIAGNOSIS — A419 Sepsis, unspecified organism: Secondary | ICD-10-CM | POA: Insufficient documentation

## 2019-08-29 DIAGNOSIS — R1030 Lower abdominal pain, unspecified: Secondary | ICD-10-CM | POA: Insufficient documentation

## 2019-10-30 ENCOUNTER — Other Ambulatory Visit: Payer: Self-pay

## 2019-10-30 ENCOUNTER — Encounter: Payer: Self-pay | Admitting: Podiatry

## 2019-10-30 ENCOUNTER — Ambulatory Visit (INDEPENDENT_AMBULATORY_CARE_PROVIDER_SITE_OTHER): Payer: Medicare HMO | Admitting: Podiatry

## 2019-10-30 DIAGNOSIS — L84 Corns and callosities: Secondary | ICD-10-CM | POA: Diagnosis not present

## 2019-10-30 DIAGNOSIS — M79674 Pain in right toe(s): Secondary | ICD-10-CM

## 2019-10-30 DIAGNOSIS — M79675 Pain in left toe(s): Secondary | ICD-10-CM

## 2019-10-30 DIAGNOSIS — B351 Tinea unguium: Secondary | ICD-10-CM | POA: Diagnosis not present

## 2019-10-30 MED ORDER — NONFORMULARY OR COMPOUNDED ITEM: 3 refills | Status: DC

## 2019-10-30 NOTE — Patient Instructions (Signed)

## 2019-11-05 NOTE — Progress Notes (Addendum)
Subjective: Diana Sanchez is seen today for follow up painful, elongated, thickened toenails 1-5 b/l feet that she cannot cut. Pain interferes with daily activities. Aggravating factor includes wearing enclosed shoe gear and relieved with periodic debridement.  She would like to discuss treatment for her fungal toenails.   Current Outpatient Medications on File Prior to Visit  Medication Sig  . docusate sodium (COLACE) 100 MG capsule Take by mouth.  Marland Kitchen albuterol (PROVENTIL HFA;VENTOLIN HFA) 108 (90 Base) MCG/ACT inhaler Inhale into the lungs.  Marland Kitchen amLODipine (NORVASC) 5 MG tablet Take 5 mg by mouth daily.  Marland Kitchen aspirin EC 81 MG tablet Take 81 mg by mouth daily.  . buprenorphine (BUTRANS) 10 MCG/HR PTWK patch   . ciclopirox (PENLAC) 8 % solution Apply one coat to each toenail daily. Remove weekly with polish remover.  . cyclobenzaprine (FLEXERIL) 10 MG tablet Take 10 mg by mouth 3 (three) times daily as needed for muscle spasms.  . ferrous sulfate 325 (65 FE) MG tablet Take by mouth.  . furosemide (LASIX) 20 MG tablet Take 1 tablet (20 mg total) by mouth daily.  Marland Kitchen gabapentin (NEURONTIN) 100 MG capsule   . hydrOXYzine (ATARAX/VISTARIL) 10 MG tablet   . ibuprofen (ADVIL) 200 MG tablet Take by mouth.  Marland Kitchen LINZESS 72 MCG capsule   . Multiple Vitamin (MULTIVITAMIN) tablet Take by mouth.  . mupirocin ointment (BACTROBAN) 2 %   . NARCAN 4 MG/0.1ML LIQD nasal spray kit   . NON FORMULARY [facility] antifungal cream  . ondansetron (ZOFRAN) 4 MG tablet   . oxyCODONE-acetaminophen (PERCOCET/ROXICET) 5-325 MG tablet Take 1 tablet by mouth 3 (three) times daily.  . pravastatin (PRAVACHOL) 40 MG tablet Take 40 mg by mouth daily.  . predniSONE (DELTASONE) 10 MG tablet Take by mouth.  . pregabalin (LYRICA) 50 MG capsule Take by mouth.  . pregabalin (LYRICA) 50 MG capsule   . rizatriptan (MAXALT) 10 MG tablet Take 10 mg by mouth as needed for migraine. May repeat in 2 hours if needed  .  SANTYL ointment   . sulfamethoxazole-trimethoprim (BACTRIM DS,SEPTRA DS) 800-160 MG tablet   . traZODone (DESYREL) 50 MG tablet    No current facility-administered medications on file prior to visit.      Allergies  Allergen Reactions  . Tramadol     nausea   Objective:  Vascular Examination: Capillary refill time <3 seconds b/l.  Dorsalis pedis present b/l.  Posterior tibial pulses present b/l.  Digital hair present b/l.  Skin temperature gradient WNL b/l.   Chronic venous insufficiency BLE. Leg wraps clean, dry and intact b/l.   Dermatological Examination: Skin with normal turgor, texture and tone b/l.  Toenails 1-5 b/l discolored, thick, dystrophic with subungual debris and pain with palpation to nailbeds due to thickness of nails.  Hyperkeratotic lesion dorsal 5th PIPJ b/l. No erythema, no edema, no drainage, no flocculence noted.  Musculoskeletal: Muscle strength 5/5 to all LE muscle groups  Bunion deformity b/l. Hammertoes 2-5 b/l.  No pain, crepitus or joint limitation noted with ROM.   Neurological Examination: Protective sensation intact with 10 gram monofilament bilaterally.  Vibratory sensation intact bilaterally.   Assessment: Painful onychomycosis toenails 1-5 b/l  Corn b/l 5th digits  Plan: 1. Toenails 1-5 b/l were debrided in length and girth without iatrogenic bleeding. Discussed topical, laser and oral medication. Patient opted for topical treatment with compounded medication. Rx written for nonformulary compounding topical antifungal: [facility]: Antifungal cream - Terbinafine 3%, Fluconazole 2%, Tea Tree  Oil 5%, Urea 10%, Ibuprofen 2% in DMSO Suspension #39m. Apply to the affected nail(s) at bedtime. 2. Corns b/l 5th digits pared utilizing sterile scalpel blade without incident. 3. Patient to continue soft, supportive shoe gear 4. Patient to report any pedal injuries to medical professional immediately. 5. Follow up 3 months.   6. Patient/POA to call should there be a concern in the interim.

## 2019-12-12 ENCOUNTER — Other Ambulatory Visit: Payer: Medicare HMO

## 2019-12-13 ENCOUNTER — Ambulatory Visit
Admission: RE | Admit: 2019-12-13 | Discharge: 2019-12-13 | Disposition: A | Discharge status: Home / Self Care | Payer: Medicare HMO | Source: Ambulatory Visit | Attending: Nurse Practitioner | Admitting: Nurse Practitioner

## 2019-12-13 ENCOUNTER — Other Ambulatory Visit: Payer: Self-pay | Admitting: Nurse Practitioner

## 2019-12-13 ENCOUNTER — Other Ambulatory Visit: Payer: Self-pay

## 2019-12-13 DIAGNOSIS — R599 Enlarged lymph nodes, unspecified: Secondary | ICD-10-CM

## 2019-12-14 ENCOUNTER — Other Ambulatory Visit: Payer: Self-pay | Admitting: Podiatry

## 2019-12-14 DIAGNOSIS — B351 Tinea unguium: Secondary | ICD-10-CM

## 2019-12-20 ENCOUNTER — Ambulatory Visit
Admission: RE | Admit: 2019-12-20 | Discharge: 2019-12-20 | Disposition: A | Discharge status: Home / Self Care | Payer: Medicare HMO | Source: Ambulatory Visit | Attending: Nurse Practitioner | Admitting: Nurse Practitioner

## 2019-12-20 ENCOUNTER — Other Ambulatory Visit: Payer: Self-pay

## 2019-12-20 DIAGNOSIS — R599 Enlarged lymph nodes, unspecified: Secondary | ICD-10-CM

## 2019-12-20 HISTORY — PX: BREAST BIOPSY: SHX20

## 2019-12-24 LAB — SURGICAL PATHOLOGY

## 2020-01-29 ENCOUNTER — Ambulatory Visit: Payer: Medicare HMO | Admitting: Podiatry

## 2020-03-21 ENCOUNTER — Encounter: Payer: Self-pay | Admitting: Podiatry

## 2020-03-21 ENCOUNTER — Ambulatory Visit (INDEPENDENT_AMBULATORY_CARE_PROVIDER_SITE_OTHER): Payer: Medicare HMO | Admitting: Podiatry

## 2020-03-21 ENCOUNTER — Other Ambulatory Visit: Payer: Self-pay

## 2020-03-21 VITALS — Temp 95.8°F

## 2020-03-21 DIAGNOSIS — Z5320 Procedure and treatment not carried out because of patient's decision for unspecified reasons: Secondary | ICD-10-CM | POA: Insufficient documentation

## 2020-03-21 DIAGNOSIS — L84 Corns and callosities: Secondary | ICD-10-CM

## 2020-03-21 DIAGNOSIS — S72112A Displaced fracture of greater trochanter of left femur, initial encounter for closed fracture: Secondary | ICD-10-CM | POA: Insufficient documentation

## 2020-03-21 DIAGNOSIS — Z8781 Personal history of (healed) traumatic fracture: Secondary | ICD-10-CM

## 2020-03-21 DIAGNOSIS — B351 Tinea unguium: Secondary | ICD-10-CM

## 2020-03-21 DIAGNOSIS — M79674 Pain in right toe(s): Secondary | ICD-10-CM

## 2020-03-21 DIAGNOSIS — M79675 Pain in left toe(s): Secondary | ICD-10-CM | POA: Diagnosis not present

## 2020-03-21 HISTORY — DX: Personal history of (healed) traumatic fracture: Z87.81

## 2020-03-21 NOTE — Progress Notes (Signed)
Subjective: Diana Sanchez presents today for follow up of corn(s) b/l 5th digits and painful mycotic toenails b/l that are difficult to trim. Pain interferes with ambulation. Aggravating factors include wearing enclosed shoe gear. Pain is relieved with periodic professional debridement.   She voices no new pedal concerns on today's visit.  Allergies  Allergen Reactions  . Tramadol     nausea     Objective: Vitals:   03/21/20 0851  Temp: (!) 95.8 F (35.4 C)    Pt 64 y.o. year old AA  female WD, WN in NAD. AAO x 3.   Vascular Examination:  Capillary fill time to digits <3 seconds b/l. Palpable DP pulses b/l. Palpable PT pulses b/l. Pedal hair sparse b/l. Skin temperature gradient within normal limits b/l. Evidence of chronic venous insufficiency b/l LE.  Dermatological Examination: Pedal skin with normal turgor, texture and tone bilaterally. No open wounds bilaterally. No interdigital macerations bilaterally. Toenails 1-5 b/l elongated, dystrophic, thickened, crumbly with subungual debris and tenderness to dorsal palpation. Hyperkeratotic lesion(s) R 5th toe, submet head 1 left foot and submet head 1 right foot.  No erythema, no edema, no drainage, no flocculence.  Musculoskeletal: Normal muscle strength 5/5 to all lower extremity muscle groups bilaterally, no pain crepitus or joint limitation noted with ROM b/l, bunion deformity noted b/l, hammertoes noted to the  2-5 bilaterally and utilizes cane for ambulation assistance.  Neurological: Protective sensation intact 5/5 intact bilaterally with 10g monofilament b/l Vibratory sensation intact b/l.  Assessment: 1. Pain due to onychomycosis of toenails of both feet   2. Corns and callosities   3. Pain in left toe(s)   4. Pain in right toe(s)    Plan: -Toenails 1-5 b/l were debrided in length and girth with sterile nail nippers and dremel without iatrogenic bleeding.  -Corn(s) R 5th toe and callus(es) submet head 1  left foot and submet head 1 right foot were debrided without complication or incident. Total number debrided =3. -Patient to continue soft, supportive shoe gear daily. -Patient to report any pedal injuries to medical professional immediately. -Patient/POA to call should there be question/concern in the interim.  Return in about 3 months (around 06/20/2020).

## 2020-03-21 NOTE — Patient Instructions (Signed)

## 2020-03-23 DIAGNOSIS — R7303 Prediabetes: Secondary | ICD-10-CM | POA: Insufficient documentation

## 2020-03-23 DIAGNOSIS — E7800 Pure hypercholesterolemia, unspecified: Secondary | ICD-10-CM | POA: Insufficient documentation

## 2020-05-09 DIAGNOSIS — K5900 Constipation, unspecified: Secondary | ICD-10-CM | POA: Insufficient documentation

## 2020-05-09 DIAGNOSIS — L2084 Intrinsic (allergic) eczema: Secondary | ICD-10-CM | POA: Insufficient documentation

## 2020-05-09 DIAGNOSIS — R413 Other amnesia: Secondary | ICD-10-CM | POA: Insufficient documentation

## 2020-06-11 ENCOUNTER — Ambulatory Visit: Payer: Medicare HMO | Admitting: Podiatry

## 2020-06-20 ENCOUNTER — Ambulatory Visit: Payer: Medicare HMO | Admitting: Podiatry

## 2020-06-25 ENCOUNTER — Ambulatory Visit: Payer: Medicare HMO | Admitting: Podiatry

## 2020-09-12 ENCOUNTER — Other Ambulatory Visit (INDEPENDENT_AMBULATORY_CARE_PROVIDER_SITE_OTHER): Payer: Medicare HMO | Admitting: Podiatry

## 2020-09-12 DIAGNOSIS — B351 Tinea unguium: Secondary | ICD-10-CM

## 2020-09-12 MED ORDER — CICLOPIROX 8 % EX SOLN: CUTANEOUS | 11 refills | Status: DC

## 2020-09-12 NOTE — Progress Notes (Signed)
Sent new Rx for Ciclopirox Solution 8% to [facility]. See Rx for details.

## 2020-10-08 ENCOUNTER — Other Ambulatory Visit: Payer: Self-pay | Admitting: Family Medicine

## 2020-10-08 DIAGNOSIS — Z1231 Encounter for screening mammogram for malignant neoplasm of breast: Secondary | ICD-10-CM

## 2020-10-20 ENCOUNTER — Other Ambulatory Visit: Payer: Self-pay

## 2020-10-20 ENCOUNTER — Ambulatory Visit
Admission: RE | Admit: 2020-10-20 | Discharge: 2020-10-20 | Disposition: A | Discharge status: Home / Self Care | Payer: Medicare HMO | Source: Ambulatory Visit | Attending: Family Medicine | Admitting: Family Medicine

## 2020-10-20 DIAGNOSIS — Z1231 Encounter for screening mammogram for malignant neoplasm of breast: Secondary | ICD-10-CM

## 2021-03-09 ENCOUNTER — Ambulatory Visit: Payer: Medicare Other | Admitting: Podiatry

## 2021-07-08 ENCOUNTER — Ambulatory Visit (HOSPITAL_COMMUNITY)
Admission: EM | Admit: 2021-07-08 | Discharge: 2021-07-08 | Disposition: A | Discharge status: Home / Self Care | Payer: Medicare Other | Attending: Psychiatry | Admitting: Psychiatry

## 2021-07-08 ENCOUNTER — Other Ambulatory Visit: Payer: Self-pay

## 2021-07-08 DIAGNOSIS — Z59811 Housing instability, housed, with risk of homelessness: Secondary | ICD-10-CM | POA: Insufficient documentation

## 2021-07-08 DIAGNOSIS — R109 Unspecified abdominal pain: Secondary | ICD-10-CM | POA: Insufficient documentation

## 2021-07-08 DIAGNOSIS — Z733 Stress, not elsewhere classified: Secondary | ICD-10-CM | POA: Diagnosis not present

## 2021-07-08 DIAGNOSIS — R519 Headache, unspecified: Secondary | ICD-10-CM | POA: Diagnosis not present

## 2021-07-08 DIAGNOSIS — F332 Major depressive disorder, recurrent severe without psychotic features: Secondary | ICD-10-CM | POA: Diagnosis not present

## 2021-07-08 DIAGNOSIS — Z96659 Presence of unspecified artificial knee joint: Secondary | ICD-10-CM | POA: Insufficient documentation

## 2021-07-08 DIAGNOSIS — Z596 Low income: Secondary | ICD-10-CM | POA: Insufficient documentation

## 2021-07-08 DIAGNOSIS — F32A Depression, unspecified: Secondary | ICD-10-CM | POA: Diagnosis present

## 2021-07-08 NOTE — ED Notes (Signed)
Patient received After Visit Summary with community follow up resources. Patient received all belongings from the [facility] locker. Patient denied SI, HI and AVH at time of discharge.
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
## 2021-07-08 NOTE — Discharge Instructions (Addendum)
You are encouraged to contact [facility] to set up outpatient counseling.  Please ask to speak to Ibby, as she is expecting your call tomorrow any time after 9AM.  Counselor will meet with you in your home.   [facility] [phone]   Patient is instructed prior to discharge to: Take all medications as prescribed by his/her mental healthcare provider. Report any adverse effects and or reactions from the medicines to his/her outpatient provider promptly. Keep all scheduled appointments, to ensure that you are getting refills on time and to avoid any interruption in your medication.  If you are unable to keep an appointment call to reschedule.  Be sure to follow-up with resources and follow-up appointments provided.  Patient has been instructed & cautioned: To not engage in alcohol and or illegal drug use while on prescription medicines. In the event of worsening symptoms, patient is instructed to call the crisis hotline, 911 and or go to the nearest ED for appropriate evaluation and treatment of symptoms. To follow-up with his/her primary care provider for your other medical issues, concerns and or health care needs.

## 2021-07-17 ENCOUNTER — Other Ambulatory Visit (INDEPENDENT_AMBULATORY_CARE_PROVIDER_SITE_OTHER): Payer: Medicare Other | Admitting: Podiatry

## 2021-07-17 DIAGNOSIS — B351 Tinea unguium: Secondary | ICD-10-CM

## 2021-07-17 MED ORDER — CICLOPIROX 8 % EX SOLN: CUTANEOUS | 11 refills | Status: DC

## 2021-07-17 NOTE — Progress Notes (Signed)
Refill Ciclopirox Nail Lacquer via [facility] pharmacy .

## 2021-08-27 DIAGNOSIS — R079 Chest pain, unspecified: Secondary | ICD-10-CM | POA: Insufficient documentation

## 2021-08-27 DIAGNOSIS — R002 Palpitations: Secondary | ICD-10-CM | POA: Insufficient documentation

## 2021-10-15 ENCOUNTER — Other Ambulatory Visit: Payer: Self-pay | Admitting: Family Medicine

## 2021-11-25 ENCOUNTER — Other Ambulatory Visit: Payer: Self-pay

## 2021-11-25 ENCOUNTER — Emergency Department (HOSPITAL_BASED_OUTPATIENT_CLINIC_OR_DEPARTMENT_OTHER): Payer: Medicare Other

## 2021-11-25 ENCOUNTER — Emergency Department (HOSPITAL_COMMUNITY)
Admission: EM | Admit: 2021-11-25 | Discharge: 2021-11-26 | Disposition: A | Discharge status: Home / Self Care | Payer: Medicare Other | Attending: Emergency Medicine | Admitting: Emergency Medicine

## 2021-11-25 DIAGNOSIS — Z7982 Long term (current) use of aspirin: Secondary | ICD-10-CM | POA: Diagnosis not present

## 2021-11-25 DIAGNOSIS — Z79899 Other long term (current) drug therapy: Secondary | ICD-10-CM | POA: Diagnosis not present

## 2021-11-25 DIAGNOSIS — L0390 Cellulitis, unspecified: Secondary | ICD-10-CM | POA: Diagnosis not present

## 2021-11-25 DIAGNOSIS — L03116 Cellulitis of left lower limb: Secondary | ICD-10-CM | POA: Insufficient documentation

## 2021-11-25 DIAGNOSIS — I10 Essential (primary) hypertension: Secondary | ICD-10-CM | POA: Diagnosis not present

## 2021-11-25 DIAGNOSIS — J45909 Unspecified asthma, uncomplicated: Secondary | ICD-10-CM | POA: Insufficient documentation

## 2021-11-25 DIAGNOSIS — Z96652 Presence of left artificial knee joint: Secondary | ICD-10-CM | POA: Insufficient documentation

## 2021-11-25 DIAGNOSIS — Z86718 Personal history of other venous thrombosis and embolism: Secondary | ICD-10-CM

## 2021-11-25 DIAGNOSIS — M79605 Pain in left leg: Secondary | ICD-10-CM | POA: Diagnosis present

## 2021-11-25 LAB — CBC WITH DIFFERENTIAL/PLATELET
Abs Immature Granulocytes: 0.02 10*3/uL (ref 0.00–0.07)
Basophils Absolute: 0 10*3/uL (ref 0.0–0.1)
Basophils Relative: 0 %
Eosinophils Absolute: 0.1 10*3/uL (ref 0.0–0.5)
Eosinophils Relative: 2 %
HCT: 32.2 % — ABNORMAL LOW (ref 36.0–46.0)
Hemoglobin: 9.6 g/dL — ABNORMAL LOW (ref 12.0–15.0)
Immature Granulocytes: 0 %
Lymphocytes Relative: 24 %
Lymphs Abs: 1.8 10*3/uL (ref 0.7–4.0)
MCH: 25.7 pg — ABNORMAL LOW (ref 26.0–34.0)
MCHC: 29.8 g/dL — ABNORMAL LOW (ref 30.0–36.0)
MCV: 86.3 fL (ref 80.0–100.0)
Monocytes Absolute: 0.5 10*3/uL (ref 0.1–1.0)
Monocytes Relative: 6 %
Neutro Abs: 5 10*3/uL (ref 1.7–7.7)
Neutrophils Relative %: 68 %
Platelets: 257 10*3/uL (ref 150–400)
RBC: 3.73 MIL/uL — ABNORMAL LOW (ref 3.87–5.11)
RDW: 15.8 % — ABNORMAL HIGH (ref 11.5–15.5)
WBC: 7.4 10*3/uL (ref 4.0–10.5)
nRBC: 0 % (ref 0.0–0.2)

## 2021-11-25 LAB — COMPREHENSIVE METABOLIC PANEL
ALT: 12 U/L (ref 0–44)
AST: 24 U/L (ref 15–41)
Albumin: 3.9 g/dL (ref 3.5–5.0)
Alkaline Phosphatase: 81 U/L (ref 38–126)
Anion gap: 10 (ref 5–15)
BUN: 21 mg/dL (ref 8–23)
CO2: 21 mmol/L — ABNORMAL LOW (ref 22–32)
Calcium: 9.4 mg/dL (ref 8.9–10.3)
Chloride: 104 mmol/L (ref 98–111)
Creatinine, Ser: 0.88 mg/dL (ref 0.44–1.00)
GFR, Estimated: >60 mL/min (ref >60)
Glucose, Bld: 70 mg/dL (ref 70–99)
Potassium: 4.4 mmol/L (ref 3.5–5.1)
Sodium: 135 mmol/L (ref 135–145)
Total Bilirubin: 0.3 mg/dL (ref 0.3–1.2)
Total Protein: 9.2 g/dL — ABNORMAL HIGH (ref 6.5–8.1)

## 2021-11-25 LAB — LACTIC ACID, PLASMA: Lactic Acid, Venous: 2.5 mmol/L (ref 0.5–1.9)

## 2021-11-25 LAB — PROTIME-INR
INR: 1 (ref 0.8–1.2)
Prothrombin Time: 13.6 seconds (ref 11.4–15.2)

## 2021-11-25 LAB — CK: Total CK: 88 U/L (ref 38–234)

## 2021-11-25 NOTE — Progress Notes (Signed)
Lower extremity venous LT study completed. ° °Preliminary results relayed to Hammond, PA via secure chat. ° °See CV Proc for preliminary results report.  ° °Shakera Ebrahimi, RDMS, RVT ° °

## 2021-11-25 NOTE — ED Triage Notes (Signed)
Pt presents with right lower leg pain

## 2021-11-25 NOTE — ED Provider Notes (Signed)
Emergency Medicine Provider Triage Evaluation Note  Diana Sanchez , a 65 y.o. female  was evaluated in triage.  Pt complains of pain in her left leg.  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  She states that about 2 months ago she started working part-time and has had increasing pain in the left leg.  She noted a wound over the medial aspect of the left ankle about a month ago.  She has seen her doctor and had been given saline for the wound. She states that the pain is worsened significantly, she states she had a fever last night.  Review of Systems  Positive: See above Negative:   Physical Exam  BP (!) 152/78 (BP Location: Left Arm)    Pulse (!) 101    Temp 98.4 F (36.9 C) (Oral)    Resp 18    SpO2 100%  Gen:   Awake, no distress   Resp:  Normal effort  MSK:   Moves extremities without difficulty left lower extremity compression garment is removed.  There is diffuse tenderness through entire left calf.  She is able to move toes on left foot. Other:  2+ DP pulse left foot.  Unable to palpate left PT pulse due to pain.  There is a 2cm shallow ulcer over the medial distal lower leg.   Medical Decision Making  Medically screening exam initiated at 5:25 PM.  Appropriate orders placed.  Diana Sanchez was informed that the remainder of the evaluation will be completed by another provider, this initial triage assessment does not replace that evaluation, and the importance of remaining in the ED until their evaluation is complete.  Note: Portions of this report may have been transcribed using voice recognition software. Every effort was made to ensure accuracy; however, inadvertent computerized transcription errors may be present    Cristina Gong, PA-C 11/25/21 1730    Terald Sleeper, MD 11/26/21 [phone]

## 2021-11-26 DIAGNOSIS — L03116 Cellulitis of left lower limb: Secondary | ICD-10-CM | POA: Diagnosis not present

## 2021-11-26 MED ORDER — CEPHALEXIN 250 MG PO CAPS
500.0000 mg | ORAL_CAPSULE | Freq: Once | ORAL | Status: AC
  Administered 2021-11-26: 500 mg via ORAL
  Filled 2021-11-26: qty 2

## 2021-11-26 MED ORDER — BACITRACIN ZINC 500 UNIT/GM EX OINT
TOPICAL_OINTMENT | Freq: Two times a day (BID) | CUTANEOUS | Status: DC
  Filled 2021-11-26: qty 0.9

## 2021-11-26 MED ORDER — CEPHALEXIN 500 MG PO CAPS: 500.0000 mg | ORAL_CAPSULE | Freq: Four times a day (QID) | ORAL | 0 refills | Status: DC

## 2021-11-26 MED ORDER — DOXYCYCLINE HYCLATE 100 MG PO TABS
100.0000 mg | ORAL_TABLET | Freq: Once | ORAL | Status: DC
  Filled 2021-11-26: qty 1

## 2021-11-26 MED ORDER — DOXYCYCLINE HYCLATE 100 MG PO CAPS: 100.0000 mg | ORAL_CAPSULE | Freq: Two times a day (BID) | ORAL | 0 refills | Status: DC

## 2021-11-26 MED ORDER — OXYCODONE-ACETAMINOPHEN 5-325 MG PO TABS: 1.0000 | ORAL_TABLET | Freq: Four times a day (QID) | ORAL | 0 refills | Status: DC | PRN

## 2021-11-26 NOTE — ED Notes (Signed)
ED Provider at bedside. 

## 2021-11-26 NOTE — ED Provider Notes (Addendum)
[facility] EMERGENCY DEPARTMENT Provider Note   CSN: [id number] Arrival date & time: 11/25/21  1617     History Chief Complaint  Patient presents with   Leg Pain    Diana Sanchez is a 65 y.o. female.  The history is provided by the patient and medical records.  Leg Pain [place] is a 65 y.o. female who presents to the Emergency Department complaining of leg pain.  She presents to the ED for evaluation of left leg pain.  In 2015 she had a left knee surgery that was complicated with hardware infection as well as DVT.  Since that time she has experienced intermittent recurrent issues with that leg.  She 2 years ago had been seeing vascular surgery for an ulcer and wound to that leg.  Overall she has been improving and 2 months ago she started back to work as she had been on disability for a very long time.  About 1 month ago she started to notice a wound developing to the medial aspect of her left leg.  2 days ago she developed increased swelling and pain to the leg.  She also felt feverish 2 days ago and unwell but does not have a recorded temperature.  No associated chest pain, difficulty breathing, nausea, vomiting.  She has a history of rheumatoid arthritis but is not currently on immunosuppression.  No history of diabetes.  She is not currently anticoagulated.  She saw her PCP yesterday, who referred her to the emergency department for further evaluation.  She currently lives alone.  Ambulates with a cane.     Past Medical History:  Diagnosis Date   Arthritis    DVT of lower limb, acute ([place])    FH: total knee replacement    Hypercholesteremia    Hypertension    Infection    in knee   Migraines     Patient Active Problem List   Diagnosis Date Noted   Lower abdominal pain 08/29/2019   Left leg swelling 08/27/2019   Sepsis without acute organ dysfunction ([place]) 08/27/2019   Abnormal mammogram 08/24/2019   Skin ulcer of left  ankle with fat layer exposed ([place]) 07/03/2019   Venous insufficiency 02/20/2019   Venous stasis dermatitis of both lower extremities 02/20/2019   Venous stasis ulcer with varicose veins ([facility]) 02/20/2019   Depression 01/26/2019   GERD (gastroesophageal reflux disease) 01/26/2019   Venous stasis ulcer of left lower leg with edema of left lower leg ([place]) 01/10/2019   Bilateral hearing loss 08/01/2018   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   Personal history of MRSA (methicillin resistant Staphylococcus aureus) 06/13/2018   Migraine without status migrainosus, not intractable 04/13/2017   DDD (degenerative disc disease), lumbar 10/19/2016   Lumbar facet arthropathy 10/19/2016   Osteoarthritis of both knees 10/19/2016   Polyarthropathy, multiple sites 10/19/2016   Chronic pain syndrome 10/19/2016   Cataract of both eyes 07/30/2016   Anemia of chronic disease 09/04/2014   History of DVT (deep vein thrombosis) 08/12/2014   Wound cellulitis 08/12/2014   S/P revision of total knee, 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     No family history on file.  Social History   Tobacco Use   Smoking status: Never   Smokeless tobacco: Never  Substance Use Topics   Alcohol use: No    Home Medications Prior to Admission medications   Medication Sig Start Date End Date Taking? Authorizing Provider  doxycycline (VIBRAMYCIN) 100 MG capsule Take 1 capsule (100 mg total) by mouth 2 (two) times daily. 11/26/21  Yes Quintella Reichert, MD   oxyCODONE-acetaminophen (PERCOCET/ROXICET) 5-325 MG tablet Take 1 tablet by mouth every 6 (six) hours as needed for severe pain. 11/26/21  Yes Quintella Reichert, MD  albuterol (PROVENTIL HFA;VENTOLIN HFA) [phone] Base) MCG/ACT inhaler Inhale into the lungs. 02/14/12   [provider]  amLODipine (NORVASC) 5 MG tablet Take 5 mg by mouth daily.    [provider]  aspirin EC 81 MG tablet Take 81 mg by mouth daily.    [provider]  buprenorphine Haze Rushing) 10 MCG/HR [place] patch  08/26/18   [provider]  ciclopirox (PENLAC) 8 % solution Apply one coat to each toenail daily. Remove every 7 days with nail polish remover. Repeat cycle for 48 weeks. 07/17/21   Marzetta Board, DPM  cyclobenzaprine (FLEXERIL) 10 MG tablet Take 10 mg by mouth 3 (three) times daily as needed for muscle spasms.    [provider]  ferrous sulfate 325 (65 FE) MG tablet Take by mouth. 01/08/19 01/08/20  [provider]  furosemide (LASIX) 20 MG tablet Take 1 tablet (20 mg total) by mouth daily. 02/11/16   Veryl Speak, MD  gabapentin (NEURONTIN) 100 MG capsule  01/08/19   [provider]  hydrOXYzine (ATARAX/VISTARIL) 10 MG tablet  07/22/18   [provider]  ibuprofen (ADVIL) 200 MG tablet Take by mouth.    [provider]  Rolan Lipa 72 MCG capsule  01/12/19   [provider]  Multiple Vitamin (MULTIVITAMIN) tablet Take by mouth.    [provider]  mupirocin ointment (BACTROBAN) 2 %  11/16/18   [provider]  [facility] 4 MG/0.1ML LIQD nasal spray kit  08/09/18   [provider]  NON FORMULARY [facility] antifungal cream    [provider]  NONFORMULARY OR COMPOUNDED ITEM Antifungal solution: Terbinafine 3%, Fluconazole 2%, Tea Tree Oil 5%, Urea 10%, Ibuprofen 2% in DMSO suspension #22m 10/30/19   GMarzetta Board DPM  ondansetron (ZOFRAN) 4 MG tablet  11/22/18   [provider]   pravastatin (PRAVACHOL) 40 MG tablet Take 40 mg by mouth daily.    [provider]  predniSONE (DELTASONE) 10 MG tablet Take by mouth.    [provider]  pregabalin (LYRICA) 50 MG capsule Take by mouth.    [provider]  pregabalin (LYRICA) 50 MG capsule  01/12/19   [provider]  rizatriptan (MAXALT) 10 MG tablet Take 10 mg by mouth as needed for migraine. May repeat in 2 hours if needed    [provider]  [facility]  11/[facility]   [provider]  sulfamethoxazole-trimethoprim (BACTRIM DS,SEPTRA DS) 800-160 MG tablet  01/24/19   [provider]  traZODone (DESYREL) 50 MG tablet  10/26/18   [provider]    Allergies    Tramadol  Review of Systems   Review of Systems  All other systems reviewed and are negative.  Physical Exam Updated Vital Signs BP 131/75    Pulse 82    Temp 98.4 F (36.9 C) (Oral)    Resp 16    SpO2 99%   Physical Exam Vitals and nursing note reviewed.  Constitutional:      Appearance: She is well-developed.  HENT:     Head: Normocephalic and atraumatic.  Cardiovascular:     Rate and Rhythm: Normal rate and regular rhythm.     Heart sounds: No murmur heard. Pulmonary:     Effort: Pulmonary effort is normal. No respiratory distress.     Breath sounds: Normal breath sounds.  Abdominal:     Palpations: Abdomen is soft.     Tenderness: There is no abdominal tenderness. There is no guarding or rebound.  Musculoskeletal:     Comments: 2+ DP pulses bilaterally.  There is 1+ edema and erythema to the left lower extremity from the ankle to the proximal calf.  There is an ulcer to the medial aspect of the left lower leg without significant drainage, approximately 2 x 2 cm.  There is tenderness to palpation throughout the left calf.  Skin:    General: Skin is warm and dry.  Neurological:     Mental Status: She is alert and oriented to person, place, and time.  Psychiatric:         Behavior: Behavior normal.     ED Results / Procedures / Treatments   Labs (all labs ordered are listed, but only abnormal results are displayed) Labs Reviewed  COMPREHENSIVE METABOLIC PANEL - Abnormal; Notable for the following components:      Result Value   CO2 21 (*)    Total Protein 9.2 (*)    All other components within normal limits  CBC WITH DIFFERENTIAL/PLATELET - Abnormal; Notable for the following components:   RBC 3.73 (*)    Hemoglobin 9.6 (*)    HCT 32.2 (*)    MCH 25.7 (*)    MCHC 29.8 (*)    RDW 15.8 (*)    All other components within normal limits  LACTIC ACID, PLASMA - Abnormal; Notable for the following components:   Lactic Acid, Venous 2.5 (*)    All other components within normal limits  CK  PROTIME-INR  LACTIC ACID, PLASMA    EKG None  Radiology VAS [place] LOWER EXTREMITY VENOUS (DVT) (7a-7p)  Result Date: 11/25/2021  Lower Venous DVT Study Patient Name:  Diana Sanchez  Date of Exam:   11/25/2021 Medical Rec #: [id number]                   Accession #:    [id number] Date of Birth: 17-Sep-1956                   Patient Gender: F Patient Age:   68 years Exam Location:  [facility] Procedure:      VAS [place] LOWER EXTREMITY VENOUS (DVT) Referring Phys: Benjamine Mola HAMMOND --------------------------------------------------------------------------------  Indications: Left medial malleolus ulceration, history of DVT and venous stasis.  Limitations: Bandages. Comparison Study: 09-09-2021 Bilateral lower extremity venous performed at                   outside facility was negative for DVT. Performing Technologist: Darlin Coco RDMS, RVT  Examination Guidelines: A complete evaluation includes B-mode imaging, spectral Doppler, color Doppler, and power Doppler as needed of all accessible portions of each vessel. Bilateral testing is considered an integral part of a complete examination.  Limited examinations for reoccurring indications may be performed as noted.  The reflux portion of the exam is performed with the patient in reverse Trendelenburg.  +-----+---------------+---------+-----------+----------+--------------+  RIGHT Compressibility Phasicity Spontaneity Properties Thrombus Aging  +-----+---------------+---------+-----------+----------+--------------+  CFV   Full            Yes       Yes                                    +-----+---------------+---------+-----------+----------+--------------+   +---------+---------------+---------+-----------+----------+-------------------+  LEFT      Compressibility Phasicity Spontaneity Properties Thrombus Aging       +---------+---------------+---------+-----------+----------+-------------------+  CFV       Full            Yes       Yes                                         +---------+---------------+---------+-----------+----------+-------------------+  SFJ       Full                                                                  +---------+---------------+---------+-----------+----------+-------------------+  FV Prox   Full                                                                  +---------+---------------+---------+-----------+----------+-------------------+  FV Mid    Full                                                                  +---------+---------------+---------+-----------+----------+-------------------+  FV Distal Full                                                                  +---------+---------------+---------+-----------+----------+-------------------+  PFV       Full                                                                  +---------+---------------+---------+-----------+----------+-------------------+  POP       Full            Yes       Yes                                         +---------+---------------+---------+-----------+----------+-------------------+  PTV       Full                                             Unable to visualize                                                               mid-distal due to                                                                bandaging            +---------+---------------+---------+-----------+----------+-------------------+  PERO                                                       Patent by color                                                                  proximal, Unable to                                                              visualize                                                                        mid-distal due to                                                                bandaging            +---------+---------------+---------+-----------+----------+-------------------+    Summary: RIGHT: - No evidence of common femoral vein obstruction.  LEFT: - There is no evidence of deep vein thrombosis in the lower extremity. However, portions of this examination were limited- see technologist comments above.  - No cystic structure found in the popliteal fossa. - Ultrasound characteristics of enlarged lymph nodes noted in the groin.  *See table(s) above for  measurements and observations.    Preliminary     Procedures Procedures   Medications Ordered in ED Medications  doxycycline (VIBRA-TABS) tablet 100 mg (has no administration in time range)  bacitracin ointment (has no administration in time range)    ED Course  I have reviewed the triage vital signs and the nursing notes.  Pertinent labs & imaging results that were available during my care of the patient were reviewed by me and considered in my medical decision making (see chart for details).    MDM Rules/Calculators/A&P                          Patient with remote history of DVT and lower extremity infection here for evaluation of recurrent pain and swelling to the left leg.  She does have an ulcer to the medial aspect of the distal leg with surrounding soft tissue swelling.  She has good distal pulses.  Examination is concerning for cellulitis.   No evidence of abscess.  Vascular ultrasound is negative for DVT, however it does not visualize the distal vessels secondary to bandaging being in place.  CBC with stable anemia when compared in care everywhere.  BMP with normal renal function.  Lactic acid is elevated but feel this is a spurious result as patient is well-hydrated, well-perfused and there are no clinical signs of sepsis.  CK is within normal limits, no evidence of acute myositis.  Will start antibiotics for likely cellulitis.  Discussed with patient importance of continuing antibiotics with PCP follow-up.  Do recommend that she has a repeat ultrasound to rule out developing DVT.  Discussed return precautions.   At time of ED discharge patient states that she is allergic to an antibiotic either amoxicillin or doxycycline, she is not sure which.  She states her side effects is nausea, vomiting, diaphoresis and near syncopal episode.  We will change her prescription to Keflex. Final Clinical Impression(s) / ED Diagnoses Final diagnoses:  Cellulitis of left lower extremity    Rx / DC Orders ED Discharge Orders          Ordered    doxycycline (VIBRAMYCIN) 100 MG capsule  2 times daily        11/26/21 0436    oxyCODONE-acetaminophen (PERCOCET/ROXICET) 5-325 MG tablet  Every 6 hours PRN        11/26/21 0436             Quintella Reichert, MD 11/26/21 0441    Quintella Reichert, MD 11/26/21 [phone]

## 2021-11-26 NOTE — Discharge Instructions (Signed)
Please follow-up with your family doctor in the next 3 to 5 days to have your wound rechecked.    It is recommended that she have a repeat ultrasound in the next week to make sure that you do not develop a blood clot.  Your initial ultrasound did not evaluate your entire lower leg.    Get rechecked sooner if you have new or concerning symptoms.

## 2021-11-26 NOTE — ED Notes (Signed)
LLE wound cleaned and bacitracin ointment applied with non-adherent dressing and kerlix. Pt shown how to do drsg change and instructed to f/u with her PCP

## 2021-11-30 ENCOUNTER — Encounter: Payer: Self-pay | Admitting: Podiatry

## 2021-11-30 ENCOUNTER — Other Ambulatory Visit: Payer: Self-pay

## 2021-11-30 ENCOUNTER — Ambulatory Visit (INDEPENDENT_AMBULATORY_CARE_PROVIDER_SITE_OTHER): Payer: Medicare Other | Admitting: Podiatry

## 2021-11-30 DIAGNOSIS — M79675 Pain in left toe(s): Secondary | ICD-10-CM | POA: Diagnosis not present

## 2021-11-30 DIAGNOSIS — D509 Iron deficiency anemia, unspecified: Secondary | ICD-10-CM | POA: Insufficient documentation

## 2021-11-30 DIAGNOSIS — M79674 Pain in right toe(s): Secondary | ICD-10-CM

## 2021-11-30 DIAGNOSIS — Z1211 Encounter for screening for malignant neoplasm of colon: Secondary | ICD-10-CM | POA: Insufficient documentation

## 2021-11-30 DIAGNOSIS — B351 Tinea unguium: Secondary | ICD-10-CM | POA: Diagnosis not present

## 2021-12-08 ENCOUNTER — Emergency Department (HOSPITAL_COMMUNITY)
Admission: EM | Admit: 2021-12-08 | Discharge: 2021-12-09 | Disposition: A | Discharge status: Home / Self Care | Payer: Medicare Other | Attending: Emergency Medicine | Admitting: Emergency Medicine

## 2021-12-08 ENCOUNTER — Encounter (HOSPITAL_COMMUNITY): Payer: Self-pay | Admitting: Emergency Medicine

## 2021-12-08 DIAGNOSIS — Z7901 Long term (current) use of anticoagulants: Secondary | ICD-10-CM | POA: Diagnosis not present

## 2021-12-08 DIAGNOSIS — Z79899 Other long term (current) drug therapy: Secondary | ICD-10-CM | POA: Insufficient documentation

## 2021-12-08 DIAGNOSIS — F329 Major depressive disorder, single episode, unspecified: Secondary | ICD-10-CM | POA: Diagnosis not present

## 2021-12-08 DIAGNOSIS — R45851 Suicidal ideations: Secondary | ICD-10-CM | POA: Diagnosis not present

## 2021-12-08 DIAGNOSIS — Z96652 Presence of left artificial knee joint: Secondary | ICD-10-CM | POA: Diagnosis not present

## 2021-12-08 DIAGNOSIS — Z86718 Personal history of other venous thrombosis and embolism: Secondary | ICD-10-CM | POA: Insufficient documentation

## 2021-12-08 DIAGNOSIS — I10 Essential (primary) hypertension: Secondary | ICD-10-CM | POA: Insufficient documentation

## 2021-12-08 DIAGNOSIS — Z7982 Long term (current) use of aspirin: Secondary | ICD-10-CM | POA: Insufficient documentation

## 2021-12-08 DIAGNOSIS — G894 Chronic pain syndrome: Secondary | ICD-10-CM | POA: Insufficient documentation

## 2021-12-08 DIAGNOSIS — Z20822 Contact with and (suspected) exposure to covid-19: Secondary | ICD-10-CM | POA: Diagnosis not present

## 2021-12-08 DIAGNOSIS — F99 Mental disorder, not otherwise specified: Secondary | ICD-10-CM

## 2021-12-08 LAB — CBC
HCT: 29.5 % — ABNORMAL LOW (ref 36.0–46.0)
Hemoglobin: 8.9 g/dL — ABNORMAL LOW (ref 12.0–15.0)
MCH: 26 pg (ref 26.0–34.0)
MCHC: 30.2 g/dL (ref 30.0–36.0)
MCV: 86.3 fL (ref 80.0–100.0)
Platelets: 257 10*3/uL (ref 150–400)
RBC: 3.42 MIL/uL — ABNORMAL LOW (ref 3.87–5.11)
RDW: 15.8 % — ABNORMAL HIGH (ref 11.5–15.5)
WBC: 7.6 10*3/uL (ref 4.0–10.5)
nRBC: 0 % (ref 0.0–0.2)

## 2021-12-08 LAB — COMPREHENSIVE METABOLIC PANEL
ALT: 11 U/L (ref 0–44)
AST: 19 U/L (ref 15–41)
Albumin: 3.4 g/dL — ABNORMAL LOW (ref 3.5–5.0)
Alkaline Phosphatase: 68 U/L (ref 38–126)
Anion gap: 10 (ref 5–15)
BUN: 25 mg/dL — ABNORMAL HIGH (ref 8–23)
CO2: 18 mmol/L — ABNORMAL LOW (ref 22–32)
Calcium: 9 mg/dL (ref 8.9–10.3)
Chloride: 109 mmol/L (ref 98–111)
Creatinine, Ser: 0.87 mg/dL (ref 0.44–1.00)
GFR, Estimated: >60 mL/min (ref >60)
Glucose, Bld: 80 mg/dL (ref 70–99)
Potassium: 4.6 mmol/L (ref 3.5–5.1)
Sodium: 137 mmol/L (ref 135–145)
Total Bilirubin: 0.6 mg/dL (ref 0.3–1.2)
Total Protein: 8 g/dL (ref 6.5–8.1)

## 2021-12-08 LAB — RESP PANEL BY RT-PCR (FLU A&B, COVID) ARPGX2
Influenza A by PCR: NEGATIVE
Influenza B by PCR: NEGATIVE
SARS Coronavirus 2 by RT PCR: NEGATIVE

## 2021-12-08 LAB — ETHANOL: Alcohol, Ethyl (B): <10 mg/dL (ref <10)

## 2021-12-08 LAB — ACETAMINOPHEN LEVEL: Acetaminophen (Tylenol), Serum: <10 ug/mL — ABNORMAL LOW (ref 10–30)

## 2021-12-08 LAB — SALICYLATE LEVEL: Salicylate Lvl: <7 mg/dL — ABNORMAL LOW (ref 7.0–30.0)

## 2021-12-08 MED ORDER — ACETAMINOPHEN 500 MG PO TABS
1000.0000 mg | ORAL_TABLET | Freq: Once | ORAL | Status: AC
  Administered 2021-12-08: 16:00:00 1000 mg via ORAL
  Filled 2021-12-08: qty 2

## 2021-12-08 MED ORDER — ACETAMINOPHEN 500 MG PO TABS
1000.0000 mg | ORAL_TABLET | Freq: Four times a day (QID) | ORAL | Status: DC | PRN
  Administered 2021-12-09: 16:00:00 1000 mg via ORAL
  Filled 2021-12-08: qty 2

## 2021-12-08 MED ORDER — CYCLOBENZAPRINE HCL 10 MG PO TABS
10.0000 mg | ORAL_TABLET | Freq: Three times a day (TID) | ORAL | Status: DC | PRN
  Administered 2021-12-09: 16:00:00 10 mg via ORAL
  Filled 2021-12-08: qty 1

## 2021-12-08 MED ORDER — IBUPROFEN 200 MG PO TABS
400.0000 mg | ORAL_TABLET | Freq: Once | ORAL | Status: AC
  Administered 2021-12-08: 16:00:00 400 mg via ORAL
  Filled 2021-12-08: qty 2

## 2021-12-08 MED ORDER — ASPIRIN EC 81 MG PO TBEC
81.0000 mg | DELAYED_RELEASE_TABLET | Freq: Every day | ORAL | Status: DC
  Administered 2021-12-08 – 2021-12-09 (×2): 81 mg via ORAL
  Filled 2021-12-08 (×2): qty 1

## 2021-12-08 MED ORDER — METHOCARBAMOL 500 MG PO TABS
750.0000 mg | ORAL_TABLET | Freq: Once | ORAL | Status: AC
  Administered 2021-12-08: 19:00:00 750 mg via ORAL
  Filled 2021-12-08: qty 2

## 2021-12-08 NOTE — ED Triage Notes (Signed)
Per EMS, coming from home-son called 911 due to mother having SI-states she has been depressed-crying spells-has been treated form a left lower extremity infection

## 2021-12-08 NOTE — ED Provider Notes (Signed)
[facility] DEPT Provider Note   CSN: [id number] Arrival date & time: 12/08/21  1404     History Chief Complaint  Patient presents with   Suicidal    Diana Sanchez is a 65 y.o. female.  Pt with hx depression c/o worsening depression in the past few weeks. States her health issues stemming from left leg surgery several years ago are major source of her frustration and feelings of depression. Denies other acute stressful or inciting events. States cant work, has financial issues, and feels sad all the time. Is eating/drinking. Is sleeping at night. No acute wt loss. Denies fever or chills. No acute or abrupt worsening of LLE pain/ulcer. Denies problems w etoh or substance abuse. Indicates compliant w home meds.  Intermittent suicidal thoughts - denies specific plan or attempt at self harm.   The history is provided by the patient and medical records.      Past Medical History:  Diagnosis Date   Arthritis    DVT of lower limb, acute ([place])    FH: total knee replacement    Hypercholesteremia    Hypertension    Infection    in knee   Migraines     Patient Active Problem List   Diagnosis Date Noted   Colon cancer screening 11/30/2021   Iron deficiency anemia 11/30/2021   Chest pain 08/27/2021   Palpitations 08/27/2021   Constipation 05/09/2020   Intrinsic eczema 05/09/2020   Memory changes 05/09/2020   Hypercholesterolemia 03/23/2020   Prediabetes 03/23/2020   Colonoscopy refused 03/21/2020   Fracture of greater trochanter of left femur ([place]) 03/21/2020   H/O fracture of hip 03/21/2020   Lower abdominal pain 08/29/2019   Left leg swelling 08/27/2019   Sepsis without acute organ dysfunction ([place]) 08/27/2019   Abnormal mammogram 08/24/2019   Skin ulcer of left ankle with fat layer exposed ([place]) 07/03/2019   Venous insufficiency 02/20/2019   Venous stasis dermatitis of both lower extremities 02/20/2019   Venous stasis ulcer with  varicose veins ([facility]) 02/20/2019   Depression 01/26/2019   GERD (gastroesophageal reflux disease) 01/26/2019   Venous stasis ulcer of left lower leg with edema of left lower leg ([place]) 01/10/2019   Bilateral hearing loss 08/01/2018   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   Personal history of MRSA (methicillin resistant Staphylococcus aureus) 06/13/2018   Migraine without status migrainosus, not intractable 04/13/2017   DDD (degenerative disc disease), lumbar 10/19/2016   Lumbar facet arthropathy 10/19/2016   Osteoarthritis of both knees 10/19/2016   Polyarthropathy, multiple sites 10/19/2016   Chronic pain syndrome 10/19/2016   Cataract of both eyes 07/30/2016   Anemia of chronic disease 09/04/2014   History of DVT (deep vein thrombosis) 08/12/2014   Wound cellulitis 08/12/2014   S/P revision of total knee, 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     No family history on file.  Social History   Tobacco Use   Smoking status: Never  Smokeless tobacco: Never  Substance Use Topics   Alcohol use: No    Home Medications Prior to Admission medications   Medication Sig Start Date End Date Taking? Authorizing Provider  albuterol (PROVENTIL HFA;VENTOLIN HFA) 108 (90 Base) MCG/ACT inhaler Inhale into the lungs. 02/14/12   [provider]  Alcohol Swabs (ALCOHOL WIPES) 70 % PADS by Does not apply route. 04/04/20   [provider]  amLODipine (NORVASC) 5 MG tablet Take 5 mg by mouth  daily.    [provider]  Aspirin 81 MG CAPS Aspirin 81 mg    [provider]  aspirin EC 81 MG tablet Take 81 mg by mouth daily.    [provider]  Blood Glucose Monitoring Suppl (ACCU-CHEK GUIDE) w/Device KIT by Does not apply route. 04/04/20   [provider]  buprenorphine Haze Rushing) 10 MCG/HR [place] patch  08/26/18   [provider]  cephALEXin (KEFLEX) 500 MG capsule Take 1 capsule (500 mg total) by mouth 4 (four) times daily. 11/26/21   Quintella Reichert, MD  ciclopirox [facility]) 8 % solution Apply one coat to each toenail daily. Remove every 7 days with nail polish remover. Repeat cycle for 48 weeks. 07/17/21   Marzetta Board, DPM  cyclobenzaprine (FLEXERIL) 10 MG tablet Take 10 mg by mouth 3 (three) times daily as needed for muscle spasms.    [provider]  cyclobenzaprine (FLEXERIL) 10 MG tablet Take by mouth. 03/23/21   [provider]  Cyclobenzaprine POWD Cyclobenzaprine    [provider]  doxycycline (VIBRAMYCIN) 100 MG capsule Take 100 mg by mouth 2 (two) times daily. 11/26/21   [provider]  ferrous sulfate 325 (65 FE) MG tablet Take by mouth. 01/08/19 01/08/20  [provider]  fluocinolone (SYNALAR) 0.025 % ointment Apply topically 2 (two) times daily as needed. 10/29/21   [provider]  furosemide (LASIX) 20 MG tablet Take 1 tablet (20 mg total) by mouth daily. 02/11/16   Veryl Speak, MD  gabapentin (NEURONTIN) 100 MG capsule  01/08/19   [provider]  hydrOXYzine (ATARAX/VISTARIL) 10 MG tablet  07/22/18   [provider]  ibuprofen (ADVIL) 200 MG tablet Take by mouth.    [provider]  Rolan Lipa 72 MCG capsule  01/12/19   [provider]  Multiple Vitamin (MULTIVITAMIN) tablet Take by mouth.    [provider]  mupirocin ointment (BACTROBAN) 2 %  11/16/18   [provider]  [facility] 4 MG/0.1ML LIQD nasal spray kit  08/09/18    [provider]  NON FORMULARY [facility] antifungal cream    [provider]  NONFORMULARY OR COMPOUNDED ITEM Antifungal solution: Terbinafine 3%, Fluconazole 2%, Tea Tree Oil 5%, Urea 10%, Ibuprofen 2% in DMSO suspension #45m 10/30/19   GMarzetta Board DPM  ondansetron (ZOFRAN) 4 MG tablet  11/22/18   [provider]  oxyCODONE-acetaminophen (PERCOCET/ROXICET) 5-325 MG tablet Take 1 tablet by mouth every 6 (six) hours as needed for severe pain. 11/26/21   RQuintella Reichert MD  OXYCODONE-ACETAMINOPHEN ER PO Oxycodone    [provider]  pravastatin (PRAVACHOL) 40 MG tablet Take 40 mg by mouth daily.    [provider]  pregabalin (LYRICA) 50 MG capsule Take by mouth.    [provider]  pregabalin (LYRICA) 50 MG capsule  01/12/19   [provider]  Rivaroxaban (XARELTO PO) Xarelto    [provider]  rizatriptan (MAXALT) 10 MG tablet Take 10 mg by mouth  as needed for migraine. May repeat in 2 hours if needed    [provider]  Rizatriptan Benzoate (MAXALT PO) Maxalt    [provider]  RIZATRIPTAN BENZOATE PO Rizatriptan    [provider]  rosuvastatin (CRESTOR) 20 MG tablet TAKE 1 TABLET BY MOUTH EVERYDAY AT BEDTIME 10/07/21   [provider]  [place] ointment  11/20/18   [provider]  traZODone (DESYREL) 50 MG tablet  10/26/18   [provider]  triamcinolone cream (KENALOG) 0.1 % APPLY TO AFFECTED AREA TWICE A DAY AS NEEDED 11/19/21   [provider]    Allergies    Tramadol  Review of Systems   Review of Systems  Constitutional:  Negative for chills and fever.  HENT:  Negative for sore throat.   Eyes:  Negative for redness.  Respiratory:  Negative for cough and shortness of breath.   Cardiovascular:  Negative for chest pain.  Gastrointestinal:  Negative for abdominal pain.  Genitourinary:  Negative for flank pain.  Musculoskeletal:   Negative for back pain and neck pain.  Skin:  Negative for rash.  Neurological:  Negative for headaches.  Hematological:  Does not bruise/bleed easily.  Psychiatric/Behavioral:  Positive for confusion and dysphoric mood.    Physical Exam Updated Vital Signs BP (!) 154/84    Pulse 94    Temp 98.6 F (37 C) (Oral)    Resp 16    SpO2 100%   Physical Exam Vitals and nursing note reviewed.  Constitutional:      Appearance: Normal appearance. She is well-developed.  HENT:     Head: Atraumatic.     Nose: Nose normal.     Mouth/Throat:     Mouth: Mucous membranes are moist.  Eyes:     General: No scleral icterus.    Conjunctiva/sclera: Conjunctivae normal.     Pupils: Pupils are equal, round, and reactive to light.  Neck:     Trachea: No tracheal deviation.  Cardiovascular:     Rate and Rhythm: Normal rate and regular rhythm.     Pulses: Normal pulses.     Heart sounds: Normal heart sounds. No murmur heard.   No friction rub. No gallop.  Pulmonary:     Effort: Pulmonary effort is normal. No respiratory distress.     Breath sounds: Normal breath sounds.  Abdominal:     General: Bowel sounds are normal. There is no distension.     Palpations: Abdomen is soft.     Tenderness: There is no abdominal tenderness.  Genitourinary:    Comments: No cva tenderness.  Musculoskeletal:        General: No swelling.     Cervical back: Normal range of motion and neck supple. No rigidity. No muscular tenderness.     Comments: Left lower leg in [place] boot. No drainage or odor to area. Foot w intact distal pulses, normal cap refill in toes.  Skin:    General: Skin is warm and dry.     Findings: No rash.  Neurological:     Mental Status: She is alert.     Comments: Alert, speech normal. Motor/sens grossly intact bil.   Psychiatric:     Comments: Tearful, appears sad/depressed. Currently denies SI although acknowledges thoughts in recent past. Pt does not appear to be responding to internal  stimuli - no delusions or hallucinations noted.     ED Results / Procedures / Treatments   Labs (all labs ordered are listed, but only abnormal  results are displayed) Results for orders placed or performed during the hospital encounter of 12/08/21  Comprehensive metabolic panel  Result Value Ref Range   Sodium 137 135 - 145 mmol/L   Potassium 4.6 3.5 - 5.1 mmol/L   Chloride 109 98 - 111 mmol/L   CO2 18 (L) 22 - 32 mmol/L   Glucose, Bld 80 70 - 99 mg/dL   BUN 25 (H) 8 - 23 mg/dL   Creatinine, Ser 0.87 0.44 - 1.00 mg/dL   Calcium 9.0 8.9 - 10.3 mg/dL   Total Protein 8.0 6.5 - 8.1 g/dL   Albumin 3.4 (L) 3.5 - 5.0 g/dL   AST 19 15 - 41 U/L   ALT 11 0 - 44 U/L   Alkaline Phosphatase 68 38 - 126 U/L   Total Bilirubin 0.6 0.3 - 1.2 mg/dL   GFR, Estimated >60 >60 mL/min   Anion gap 10 5 - 15  Ethanol  Result Value Ref Range   Alcohol, Ethyl (B) <93 <26 mg/dL  Salicylate level  Result Value Ref Range   Salicylate Lvl <7.1 (L) 7.0 - 30.0 mg/dL  Acetaminophen level  Result Value Ref Range   Acetaminophen (Tylenol), Serum <10 (L) 10 - 30 ug/mL  cbc  Result Value Ref Range   WBC 7.6 4.0 - 10.5 K/uL   RBC 3.42 (L) 3.87 - 5.11 MIL/uL   Hemoglobin 8.9 (L) 12.0 - 15.0 g/dL   HCT 29.5 (L) 36.0 - 46.0 %   MCV 86.3 80.0 - 100.0 fL   MCH 26.0 26.0 - 34.0 pg   MCHC 30.2 30.0 - 36.0 g/dL   RDW 15.8 (H) 11.5 - 15.5 %   Platelets 257 150 - 400 K/uL   nRBC 0.0 0.0 - 0.2 %   VAS [place] LOWER EXTREMITY VENOUS (DVT) (7a-7p)  Result Date: 11/26/2021  Lower Venous DVT Study Patient Name:  TIRZAH FROSS [facility]  Date of Exam:   11/25/2021 Medical Rec #: [id number]                   Accession #:    [id number] Date of Birth: 06-19-1956                   Patient Gender: F Patient Age:   76 years Exam Location:  [facility] Procedure:      VAS [place] LOWER EXTREMITY VENOUS (DVT) Referring Phys: Benjamine Mola HAMMOND --------------------------------------------------------------------------------   Indications: Left medial malleolus ulceration, history of DVT and venous stasis.  Limitations: Bandages. Comparison Study: 09-09-2021 Bilateral lower extremity venous performed at                   outside facility was negative for DVT. Performing Technologist: Darlin Coco RDMS, RVT  Examination Guidelines: A complete evaluation includes B-mode imaging, spectral Doppler, color Doppler, and power Doppler as needed of all accessible portions of each vessel. Bilateral testing is considered an integral part of a complete examination. Limited examinations for reoccurring indications may be performed as noted. The reflux portion of the exam is performed with the patient in reverse Trendelenburg.  +-----+---------------+---------+-----------+----------+--------------+  RIGHT Compressibility Phasicity Spontaneity Properties Thrombus Aging  +-----+---------------+---------+-----------+----------+--------------+  CFV   Full            Yes       Yes                                    +-----+---------------+---------+-----------+----------+--------------+   +---------+---------------+---------+-----------+----------+-------------------+  LEFT      Compressibility Phasicity Spontaneity Properties Thrombus Aging       +---------+---------------+---------+-----------+----------+-------------------+  CFV       Full            Yes       Yes                                         +---------+---------------+---------+-----------+----------+-------------------+  SFJ       Full                                                                  +---------+---------------+---------+-----------+----------+-------------------+  FV Prox   Full                                                                  +---------+---------------+---------+-----------+----------+-------------------+  FV Mid    Full                                                                   +---------+---------------+---------+-----------+----------+-------------------+  FV Distal Full                                                                  +---------+---------------+---------+-----------+----------+-------------------+  PFV       Full                                                                  +---------+---------------+---------+-----------+----------+-------------------+  POP       Full            Yes       Yes                                         +---------+---------------+---------+-----------+----------+-------------------+  PTV       Full                                             Unable to visualize  mid-distal due to                                                                bandaging            +---------+---------------+---------+-----------+----------+-------------------+  PERO                                                       Patent by color                                                                  proximal, Unable to                                                              visualize                                                                        mid-distal due to                                                                bandaging            +---------+---------------+---------+-----------+----------+-------------------+     Summary: RIGHT: - No evidence of common femoral vein obstruction.  LEFT: - There is no evidence of deep vein thrombosis in the lower extremity. However, portions of this examination were limited- see technologist comments above.  - No cystic structure found in the popliteal fossa. - Ultrasound characteristics of enlarged lymph nodes noted in the groin.  *See table(s) above for measurements and observations. Electronically signed by Orlie Pollen on 11/26/2021 at 3:51:53 PM.    Final      EKG None  Radiology No results found.  Procedures Procedures    Medications Ordered in ED Medications  acetaminophen (TYLENOL) tablet 1,000 mg (has no administration in time range)  ibuprofen (ADVIL) tablet 400 mg (has no administration in time range)    ED Course  I have reviewed the triage vital signs and the nursing notes.  Pertinent labs & imaging results that were available during my care of the patient were reviewed by me and considered in my medical decision making (see chart for details).    MDM Rules/Calculators/A&P  Labs sent.   Reviewed nursing notes and prior charts for additional history.  Recent vascular u/s negative for DVT.   Labs reviewed/interpreted by me - wbc normal. Glucose normal.   Pt asked for something for pain, states has chronic pain. Acetaminophen and ibuprofen po.   Behavioral Health consult placed. Disposition per [facility] team.   The patient has been placed in psychiatric observation due to the need to provide a safe environment for the patient while obtaining psychiatric consultation and evaluation, as well as ongoing medical and medication management to treat the patient's condition.  The patient has not been placed under full IVC at this time.   Final Clinical Impression(s) / ED Diagnoses Final diagnoses:  None    Rx / DC Orders ED Discharge Orders     None        Lajean Saver, MD 12/08/21 1733

## 2021-12-08 NOTE — BH Assessment (Addendum)
@  2055, requested patient's nurse Fayrene Fearing, RN) to set up the TTS machine for patient's initial TTS assessment.   @2104 , TTS cart unavailable. Cart being used for another patient.

## 2021-12-08 NOTE — Progress Notes (Signed)
°  Subjective:  Patient ID: Diana Sanchez, female    DOB: [birth year],  MRN: [id number]  [name] presents to clinic today for corn(s) right 5th toe , callus(es) b/l and painful mycotic nails.  Pain interferes with ambulation. Aggravating factors include wearing enclosed shoe gear. Painful toenails interfere with ambulation. Aggravating factors include wearing enclosed shoe gear. Pain is relieved with periodic professional debridement. Painful corns and calluses are aggravated when weightbearing with and without shoegear. Pain is relieved with periodic professional debridement.  Patient states she was seen in the ED for left leg wound which recurred. She was diagnosed with cellulitis. She was given Rx for Keflex.  Allergies  Allergen Reactions   Tramadol     nausea Other reaction(s): Unknown    Review of Systems: Negative except as noted in the HPI. Objective:   Constitutional Diana Sanchez is a pleasant 65 y.o. African American female, WD, WN in NAD. AAO x 3.   Vascular CFT <3 seconds b/l LE. Palpable DP pulse(s) b/l LE. Palpable PT pulse(s) b/l LE. Pedal hair absent. No pain with calf compression RLE. Nonpitting edema noted BLE. Evidence of chronic venous insufficiency b/l LE. No cyanosis or clubbing noted b/l feet.  Neurologic Normal speech. Oriented to person, place, and time. Protective sensation intact 5/5 intact bilaterally with 10g monofilament b/l. Vibratory sensation intact b/l.  Dermatologic Dressing noted medial aspect left leg and it is clean, dry and intact. No interdigital macerations noted b/l LE. Toenails 1-5 b/l elongated, discolored, dystrophic, thickened, crumbly with subungual debris and tenderness to dorsal palpation.  Orthopedic: Muscle strength 5/5 to all lower extremity muscle groups bilaterally. HAV with bunion deformity noted b/l LE. Hammertoe deformity noted 2-5 b/l.   Radiographs: None   Assessment:   1. Pain due to  onychomycosis of toenails of both feet    Plan:  Patient was evaluated and treated and all questions answered. Consent given for treatment as described below: -Patient seen in ED several days ago for recurrent lower leg wound LLE. She is followed by Wound Care and Vascular Surgery. -Mycotic toenails 1-5 bilaterally were debrided in length and girth with sterile nail nippers and dremel without incident. -Patient to report any pedal injuries to medical professional immediately. -Patient/POA to call should there be question/concern in the interim.  Return in about 3 months (around 02/28/2022).  Freddie Breech, DPM

## 2021-12-08 NOTE — ED Notes (Signed)
Unable to get a Collect Comprehensive metabolic panel due to patient jerking back and forth. Nurse aware.

## 2021-12-09 DIAGNOSIS — R45851 Suicidal ideations: Secondary | ICD-10-CM

## 2021-12-09 DIAGNOSIS — F329 Major depressive disorder, single episode, unspecified: Secondary | ICD-10-CM | POA: Diagnosis not present

## 2021-12-09 MED ORDER — OXYCODONE HCL 5 MG PO TABS
5.0000 mg | ORAL_TABLET | Freq: Once | ORAL | Status: AC
  Administered 2021-12-09: 01:00:00 5 mg via ORAL
  Filled 2021-12-09: qty 1

## 2021-12-09 NOTE — ED Notes (Signed)
Dr Bero at bedside.  

## 2021-12-09 NOTE — BH Assessment (Signed)
Comprehensive Clinical Assessment (CCA) Note  12/09/2021 Diana Sanchez [id number]  Disposition: Margorie John, PA, recommends continual observation for safety and stabilization with psych reassessment in the AM. Collateral contact also requested, unable to reach collateral at this time. Diana Rinks, RN, informed of disposition.   The patient demonstrates the following risk factors for suicide: Chronic risk factors for suicide include: psychiatric disorder of depression, medical illness multiple, and chronic pain. Acute risk factors for suicide include: social withdrawal/isolation. Protective factors for this patient include: responsibility to others (children, family). Considering these factors, the overall suicide risk at this point appears to be moderate. Patient is not appropriate for outpatient follow up.  Flowsheet Row ED from 12/08/2021 in [place] [facility]PT ED from 11/25/2021 in [place] ED from 07/08/2021 in [place] CATEGORY Low Risk No Risk Error: Q3, 4, or 5 should not be populated when Q2 is No      Diana Sanchez is a 65 year old presenting voluntary to [facility] due to SI with no plan. Patient reported SI only due to excruciating medical pain, which she reports currently feeling. Patient denied HI, psychosis and alcohol/drug usage. Patient reported having bilateral knee surgery 2015. Patient reported her quality of life is not good. 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 Patient reported pain every year during the holidays. During assessment patient had open wound on leg that she continued to lift up in air and moaned in pain throughout entire assessment. Patient was redirected multiple times, as she was not answering questions. Patient denied prior psych hospitalizations, suicide attempts and self-harming behaviors.    Patient denied receiving any outpatient mental health services. Patient denied being prescribed any psych medications.   Patient resides alone. Patient is currently on disability for medical reasons. Patient denied access to guns. Patient was in pain and was cooperative during assessment after many times of being redirected.   Chief Complaint:  Chief Complaint  Patient presents with   Suicidal   Visit Diagnosis:  Major depressive disorder  CCA Screening, Triage and Referral (STR)  Patient Reported Information How did you hear about [place]? Self  What Is the Reason for Your Visit/Call Today? SI due to excrutiating pain  How Long Has This Been Causing You Problems? > than 6 months  What Do You Feel Would Help You the Most Today? Treatment for Depression or other mood problem   Have You Recently Had Any Thoughts About Hurting Yourself? Yes  Are You Planning to Commit Suicide/Harm Yourself At This time? No   Have you Recently Had Thoughts About [place]? No  Are You Planning to Harm Someone at This Time? No  Explanation: No data recorded  Have You Used Any Alcohol or Drugs in the Past 24 Hours? No  How Long Ago Did You Use Drugs or Alcohol? No data recorded What Did You Use and How Much? No data recorded  Do You Currently Have a Therapist/Psychiatrist? No  Name of Therapist/Psychiatrist: No data recorded  Have You Been Recently Discharged From Any Office Practice or Programs? No  Explanation of Discharge From Practice/Program: No data recorded    CCA Screening Triage Referral Assessment Type of Contact: Tele-Assessment  Telemedicine Service Delivery:   Is this Initial or Reassessment? Initial Assessment  Date Telepsych consult ordered in [facility]:  12/08/21  Time Telepsych consult ordered in [facility]:  1525  Location of Assessment: [facility] ED  Provider Location: [facility] Assessment Services   Collateral Involvement: none reported   Does Patient Have a Editor, commissioning Guardian? No data recorded Name and Contact of Legal Guardian: No data recorded If Minor and Not Living with Parent(s), Who has Custody? No data recorded Is CPS involved or ever been involved? Never  Is APS involved or ever been involved? Never   Patient Determined To Be At Risk for Harm To Self or Others Based on Review of Patient Reported Information or Presenting Complaint? Yes, for Self-Harm  Method: No data recorded Availability of Means: No data recorded Intent: No data recorded Notification Required: No data recorded Additional Information for Danger to Others Potential: No data recorded Additional Comments for Danger to Others Potential: No data recorded Are There Guns or Other Weapons in Your Home? No data recorded Types of Guns/Weapons: No data recorded Are These Weapons Safely Secured?                            No data recorded Who Could Verify You Are Able To Have These Secured: No data recorded Do You Have any Outstanding Charges, Pending Court Dates, Parole/Probation? No data recorded Contacted To Inform of Risk of Harm To Self or Others: Unable to Contact: (attempting to reach son)    Does Patient Present under Involuntary Commitment? No  IVC Papers Initial File Date: No data recorded  [place] of Residence: [place]   Patient Currently Receiving the Following Services: Not Receiving Services   Determination of Need: Emergent (2 hours)   Options For Referral: Medication Management; Inpatient Hospitalization; Outpatient Therapy     CCA Biopsychosocial Patient Reported Schizophrenia/Schizoaffective Diagnosis in Past: No   Strengths: Independent, has support   Mental Health Symptoms Depression:   Hopelessness; Increase/decrease in appetite; Worthlessness   Duration of Depressive symptoms:    Mania:   None   Anxiety:    Worrying; Tension; Restlessness; Fatigue; Difficulty concentrating   Psychosis:   None   Duration of  Psychotic symptoms:    Trauma:   None   Obsessions:   None   Compulsions:   None   Inattention:   None   Hyperactivity/Impulsivity:   N/A   Oppositional/Defiant Behaviors:   N/A   Emotional Irregularity:   Chronic feelings of emptiness   Other Mood/Personality Symptoms:  No data recorded   Mental Status Exam Appearance and self-care  Stature:   Average   Weight:   Average weight   Clothing:   Casual   Grooming:   Normal   Cosmetic use:   None   Posture/gait:   Normal   Motor activity:   Slowed   Sensorium  Attention:   Normal   Concentration:   Normal; Anxiety interferes   Orientation:   X5   Recall/memory:   Defective in Recent (some memory problems - likely normal age related decline)   Affect and Mood  Affect:   Depressed   Mood:   Hopeless; Depressed   Relating  Eye contact:   None   Facial expression:   Anxious; Depressed; Responsive   Attitude toward examiner:   Cooperative   Thought and Language  Speech flow:  Clear and Coherent   Thought content:   Appropriate to Mood and Circumstances   Preoccupation:   None   Hallucinations:   None   Organization:  No data recorded  [employer] of Knowledge:  Average   Intelligence:   Average   Abstraction:   Normal   Judgement:   Fair   Reality Testing:   Adequate   Insight:   Fair   Decision Making:   Normal   Social Functioning  Social Maturity:   Responsible   Social Judgement:   Normal   Stress  Stressors:   Museum/gallery curator; Illness   Coping Ability:   Exhausted; Overwhelmed   Skill Deficits:   Decision making; Interpersonal; Self-control; Responsibility   Supports:   Family     Religion: Religion/Spirituality Are You A Religious Person?: Yes  Leisure/Recreation: Leisure / Recreation Do You Have Hobbies?: No  Exercise/Diet: Exercise/Diet Do You Exercise?: No Have You Gained or Lost A Significant Amount of Weight in  the Past Six Months?: No Do You Follow a Special Diet?: No Do You Have Any Trouble Sleeping?: Yes Explanation of Sleeping Difficulties: no sleep since 4 days ago   CCA Employment/Education Employment/Work Situation: Employment / Work Situation Employment Situation: On disability Why is Patient on Disability: medical reasons How Long has Patient Been on Disability: 2015 ? Patient's Job has Been Impacted by Current Illness: Yes Has Patient ever Been in the [place]?: No  Education: Education Last Grade Completed: 12 Did [place]?: No Did You Have An Individualized Education Program (IIEP): No Did You Have Any Difficulty At School?: No   CCA Family/Childhood History Family and Relationship History: Family history Does patient have children?: Yes How many children?: 2 How is patient's relationship with their children?: good  Childhood History:  Childhood History By whom was/is the patient raised?: Both parents Did patient suffer any verbal/emotional/physical/sexual abuse as a child?: No Has patient ever been sexually abused/assaulted/raped as an adolescent or adult?: No Witnessed domestic violence?: No Has patient been affected by domestic violence as an adult?: No  Child/Adolescent Assessment:     CCA Substance Use Alcohol/Drug Use: Alcohol / Drug Use Pain Medications: See MAR Prescriptions: See MAR Over the Counter: See MAR History of alcohol / drug use?: No history of alcohol / drug abuse                         ASAM's:  Six Dimensions of Multidimensional Assessment  Dimension 1:  Acute Intoxication and/or Withdrawal Potential:      Dimension 2:  Biomedical Conditions and Complications:      Dimension 3:  Emotional, Behavioral, or Cognitive Conditions and Complications:     Dimension 4:  Readiness to Change:     Dimension 5:  Relapse, Continued use, or Continued Problem Potential:     Dimension 6:  Recovery/Living Environment:     ASAM  Severity Score:    ASAM Recommended Level of Treatment:     Substance use Disorder (SUD)    Recommendations for Services/Supports/Treatments:    Discharge Disposition:    DSM5 Diagnoses: Patient Active Problem List   Diagnosis Date Noted   Colon cancer screening 11/30/2021   Iron deficiency anemia 11/30/2021   Chest pain 08/27/2021   Palpitations 08/27/2021   Constipation 05/09/2020   Intrinsic eczema 05/09/2020   Memory changes 05/09/2020   Hypercholesterolemia 03/23/2020   Prediabetes 03/23/2020   Colonoscopy refused 03/21/2020   Fracture of greater trochanter of left femur ([place]) 03/21/2020   H/O fracture of hip 03/21/2020   Lower abdominal pain 08/29/2019   Left leg swelling 08/27/2019   Sepsis without acute organ dysfunction ([place]) 08/27/2019   Abnormal mammogram 08/24/2019   Skin  ulcer of left ankle with fat layer exposed (HCC) 07/03/2019   Venous insufficiency 02/20/2019   Venous stasis dermatitis of both lower extremities 02/20/2019   Venous stasis ulcer with varicose veins (HCC) 02/20/2019   Depression 01/26/2019   GERD (gastroesophageal reflux disease) 01/26/2019   Venous stasis ulcer of left lower leg with edema of left lower leg (HCC) 01/10/2019   Bilateral hearing loss 08/01/2018   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   Personal history of MRSA (methicillin resistant Staphylococcus aureus) 06/13/2018   Migraine without status migrainosus, not intractable 04/13/2017   DDD (degenerative disc disease), lumbar 10/19/2016   Lumbar facet arthropathy 10/19/2016   Osteoarthritis of both knees 10/19/2016   Polyarthropathy, multiple sites 10/19/2016   Chronic pain syndrome 10/19/2016   Cataract of both eyes 07/30/2016   Anemia of chronic disease 09/04/2014   History of DVT (deep vein thrombosis) 08/12/2014   Wound cellulitis 08/12/2014   S/P revision of total knee, 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/31/2014   DOE (dyspnea on exertion) 12/07/2013   Generalized weakness 12/06/2013   Hypokalemia 12/06/2013   SLE (systemic lupus erythematosus) (HCC) 11/13/2013   Asthma 02/14/2012   Rheumatoid arthritis (HCC) 12/17/2011   Polymyalgia rheumatica (HCC) 12/17/2011     Referrals to Alternative Service(s): Referred to Alternative Service(s):   Place:   Date:   Time:    Referred to Alternative Service(s):   Place:   Date:   Time:    Referred to Alternative Service(s):   Place:   Date:   Time:    Referred to Alternative Service(s):   Place:   Date:   Time:     Burnetta Sabin, [facility]

## 2021-12-09 NOTE — ED Notes (Signed)
Patient sleeping

## 2021-12-09 NOTE — ED Notes (Signed)
TTS in progress 

## 2021-12-09 NOTE — Discharge Instructions (Signed)
For your behavioral health needs you are advised to follow up with an outpatient therapist.  Contact one of the following providers at your earliest opportunity to schedule an intake appointment:       [facility]      [address]      [place], [place] [address]      [phone]       [facility]      [address] [place], [place] [address]      [phone]      New patients are seen at their walk-in clinic.  Walk-in hours are Monday - Friday from 8:30 am - 12:00 pm, and from 1:00 pm - 2:30 pm.  Walk-in patients are seen on a first come, first served basis, so try to arrive as early as possible for the best chance of being seen the same day.       [facility]      [address] [place], [place] [address]      [phone]

## 2021-12-09 NOTE — ED Notes (Addendum)
Patient reports she has not been able to ambulate since Saturday due to pain in L leg.  Patient states, "I've been in bed since Saturday.  The pain rocked my body.  I could not move.  All I could do was cry, cry, cry."

## 2021-12-09 NOTE — ED Notes (Signed)
TTS complete 

## 2021-12-09 NOTE — BH Assessment (Signed)
[facility] Assessment Progress Note   Per Elta Guadeloupe, NP, this voluntary pt does not require psychiatric hospitalization at this time.  Pt is psychiatrically cleared.  Discharge instructions include referral information for area outpatient therapists.  EDP Arby Barrette, MD and pt's nurse, Duwayne Heck, have been notified.  Doylene Canning, MA Triage Specialist [phone]

## 2021-12-09 NOTE — Consult Note (Signed)
[facility] Consult   Reason for Consult:  suicidal ideation Referring Physician:  Lajean Saver Patient Identification: Diana Sanchez MRN:  [id number] Principal Diagnosis: Chronic pain syndrome Diagnosis:  Principal Problem:   Chronic pain syndrome Active Problems:   Suicidal ideations   Total Time spent with patient: 30 minutes  Subjective:   Diana Sanchez is a 65 y.o. female patient admitted with suicidal ideation due to being in constant pain in her legs from ongoing venous stasis ulcers. She has a stasis ulcer on her left medial ankle. Per chart review she was seen by her outpatient doctor on 12/21 and has been referred to a wound care clinic. Patient stated she is frustrated, angry and distracted by the pain. She stated she is not crazy. She stated she just wants her legs to heal so she can work. 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 She presents as angry and frustrated with her situation. She stated she would never do anything to harm herself or anyone else because she knows right from wrong. She believes suicide is wrong. She lives alone and denies access to weapons. Patient denies depressive symptoms and anxiety. Patient denies suicidal and homicidal ideation, auditory and visual hallucinations, paranoia and delusions.   She is alert & oriented x 4; calm & cooperative; and mood is congruent with affect.  She is speaking in a clear tone at moderate volume, and normal pace; with good eye contact. She is frustrated and angry. Her thought process is coherent and relevant; There is no indication that she is currently responding to internal/external stimuli or experiencing delusional thought content; and she has denied suicidal/self-harm/homicidal ideation, psychosis, and paranoia.   Past Psychiatric History: None noted in chart  Risk to Self:   No Risk to Others:  No Prior Inpatient Therapy:  No Prior Outpatient Therapy:  No  Past Medical History:  Past Medical History:  Diagnosis Date   Arthritis    DVT of lower limb, acute (HCC)    FH: total knee replacement    Hypercholesteremia    Hypertension    Infection    in knee   Migraines     Past Surgical History:  Procedure Laterality Date   CESAREAN SECTION     HIP SURGERY     TOTAL KNEE ARTHROPLASTY     Family History: No family history on file. Family Psychiatric  History: Unknown Social History:  Social History   Substance and Sexual Activity  Alcohol Use No     Social History   Substance and Sexual Activity  Drug Use Not on file    Social History   Socioeconomic History   Marital status: Single    Spouse name: Not on file   Number of children: Not on file   Years of education: Not on file   Highest education level: Not on file  Occupational History   Not on file  Tobacco Use   Smoking status: Never   Smokeless tobacco: Never  Substance and Sexual Activity   Alcohol use: No   Drug use: Not on file   Sexual activity: Not on file  Other Topics Concern   Not on file  Social History Narrative   Not on file   Social Determinants of Health   Financial Resource Strain: Not on file  Food Insecurity: Not on file  Transportation Needs: Not on file  Physical Activity: Not on file  Stress: Not on file  Social Connections: Not on file   Additional Social History:    Allergies:   Allergies  Allergen Reactions   Tramadol     nausea Other reaction(s): Unknown    Labs:  Results for orders placed or performed during the hospital encounter of 12/08/21 (from the past 48 hour(s))  Comprehensive metabolic panel     Status: Abnormal   Collection Time: 12/08/21  3:37 PM  Result Value Ref Range   Sodium 137 135 - 145 mmol/L   Potassium 4.6 3.5 - 5.1 mmol/L   Chloride 109 98 - 111 mmol/L   CO2 18 (L) 22 - 32 mmol/L   Glucose, Bld 80 70 - 99 mg/dL     Comment: Glucose reference range applies only to samples taken after fasting for at least 8 hours.   BUN 25 (H) 8 - 23 mg/dL   Creatinine, Ser 0.87 0.44 - 1.00 mg/dL   Calcium 9.0 8.9 - 10.3 mg/dL   Total Protein 8.0 6.5 - 8.1 g/dL   Albumin 3.4 (L) 3.5 - 5.0 g/dL   AST 19 15 - 41 U/L   ALT 11 0 - 44 U/L   Alkaline Phosphatase 68 38 - 126 U/L   Total Bilirubin 0.6 0.3 - 1.2 mg/dL   GFR, Estimated >60 >60 mL/min    Comment: (NOTE) Calculated using the CKD-EPI Creatinine Equation (2021)    Anion gap 10 5 - 15    Comment: Performed at [facility], [place] [address] [place], [place] [address]  Ethanol     Status: None   Collection Time: 12/08/21  3:37 PM  Result Value Ref Range   Alcohol, Ethyl (B) <10 <10 mg/dL    Comment: (NOTE) Lowest detectable limit for serum alcohol is 10 mg/dL.  For medical purposes only. Performed at [facility], [place] [address], [place], [place] [address]   Salicylate level     Status: Abnormal   Collection Time: 12/08/21  3:37 PM  Result Value Ref Range   Salicylate Lvl <0.3 (L) 7.0 - 30.0 mg/dL    Comment: Performed at [facility], [place] [address] [place], [place] [address]  Acetaminophen level     Status: Abnormal   Collection Time: 12/08/21  3:37 PM  Result Value Ref Range   Acetaminophen (Tylenol), Serum <10 (L) 10 - 30 ug/mL    Comment: (NOTE) Therapeutic concentrations vary significantly. A range of 10-30 ug/mL  may be an effective concentration for many patients. However, some  are best treated at concentrations outside of this range. Acetaminophen concentrations >150 ug/mL at 4 hours after ingestion  and >50 ug/mL at 12 hours after ingestion are often associated with  toxic reactions.  Performed at [facility], [place] [address], [place], [place] [address]   cbc     Status: Abnormal   Collection Time: 12/08/21  3:37 PM  Result Value Ref Range   WBC 7.6 4.0 -  10.5 K/uL   RBC 3.42 (L) 3.87 - 5.11 MIL/uL   Hemoglobin 8.9 (L) 12.0 - 15.0 g/dL   HCT 29.5 (L) 36.0 - 46.0 %   MCV 86.3 80.0 - 100.0 fL   MCH 26.0 26.0 - 34.0 pg   MCHC 30.2 30.0 - 36.0 g/dL   RDW 15.8 (H) 11.5 - 15.5 %   Platelets 257 150 - 400 K/uL   nRBC 0.0 0.0 -  0.2 %    Comment: Performed at [facility], [place] [address], [place], [place] [address]  Resp Panel by RT-PCR (Flu A&B, Covid) Nasopharyngeal Swab     Status: None   Collection Time: 12/08/21  6:23 PM   Specimen: Nasopharyngeal Swab; Nasopharyngeal(NP) swabs in vial transport medium  Result Value Ref Range   SARS Coronavirus 2 by RT PCR NEGATIVE NEGATIVE    Comment: (NOTE) SARS-CoV-2 target nucleic acids are NOT DETECTED.  The SARS-CoV-2 RNA is generally detectable in upper respiratory specimens during the acute phase of infection. The lowest concentration of SARS-CoV-2 viral copies this assay can detect is 138 copies/mL. A negative result does not preclude SARS-Cov-2 infection and should not be used as the sole basis for treatment or other patient management decisions. A negative result may occur with  improper specimen collection/handling, submission of specimen other than nasopharyngeal swab, presence of viral mutation(s) within the areas targeted by this assay, and inadequate number of viral copies(<138 copies/mL). A negative result must be combined with clinical observations, patient history, and epidemiological information. The expected result is Negative.  Fact Sheet for Patients:  EntrepreneurPulse.com.au  Fact Sheet for Healthcare Providers:  IncredibleEmployment.be  This test is no t yet approved or cleared by the [place] FDA and  has been authorized for detection and/or diagnosis of SARS-CoV-2 by FDA under an Emergency Use Authorization (EUA). This EUA will remain  in effect (meaning this test can be used) for the duration of the COVID-19  declaration under Section 564(b)(1) of the Act, 21 U.S.C.section 360bbb-3(b)(1), unless the authorization is terminated  or revoked sooner.       Influenza A by PCR NEGATIVE NEGATIVE   Influenza B by PCR NEGATIVE NEGATIVE    Comment: (NOTE) The Xpert Xpress SARS-CoV-2/FLU/RSV plus assay is intended as an aid in the diagnosis of influenza from Nasopharyngeal swab specimens and should not be used as a sole basis for treatment. Nasal washings and aspirates are unacceptable for Xpert Xpress SARS-CoV-2/FLU/RSV testing.  Fact Sheet for Patients: EntrepreneurPulse.com.au  Fact Sheet for Healthcare Providers: IncredibleEmployment.be  This test is not yet approved or cleared by the [place] FDA and has been authorized for detection and/or diagnosis of SARS-CoV-2 by FDA under an Emergency Use Authorization (EUA). This EUA will remain in effect (meaning this test can be used) for the duration of the COVID-19 declaration under Section 564(b)(1) of the Act, 21 U.S.C. section 360bbb-3(b)(1), unless the authorization is terminated or revoked.  Performed at [facility], [place] [address], [place], [place] [address]     Current Facility-Administered Medications  Medication Dose Route Frequency Provider Last Rate Last Admin   acetaminophen (TYLENOL) tablet 1,000 mg  1,000 mg Oral Q6H PRN Lajean Saver, MD       aspirin EC tablet 81 mg  81 mg Oral Daily Lajean Saver, MD   81 mg at 12/08/21 2048   cyclobenzaprine (FLEXERIL) tablet 10 mg  10 mg Oral TID PRN Lajean Saver, MD       Current Outpatient Medications  Medication Sig Dispense Refill   albuterol (PROVENTIL HFA;VENTOLIN HFA) 108 (90 Base) MCG/ACT inhaler Inhale into the lungs.     aspirin EC 81 MG tablet Take 81 mg by mouth daily.     cephALEXin (KEFLEX) 500 MG capsule Take 1 capsule (500 mg total) by mouth 4 (four) times daily. 28 capsule 0   doxycycline (VIBRAMYCIN) 100 MG  capsule Take 100 mg by mouth 2 (two) times daily.  fluocinolone (SYNALAR) 0.025 % ointment Apply topically 2 (two) times daily as needed.     LINZESS 72 MCG capsule Take 72 mcg by mouth daily before breakfast.     Multiple Vitamin (MULTIVITAMIN) tablet Take 1 tablet by mouth daily.     oxyCODONE-acetaminophen (PERCOCET/ROXICET) 5-325 MG tablet Take 1 tablet by mouth every 6 (six) hours as needed for severe pain. 5 tablet 0   rizatriptan (MAXALT) 5 MG tablet Take 5 mg by mouth See admin instructions. TAKE TWO TABLETS (10 MG DOSE) BY MOUTH AS NEEDED FOR MIGRAINE. MAY REPEAT IN 2 HOURS IF NEEDED     rosuvastatin (CRESTOR) 20 MG tablet Take 20 mg by mouth daily.     triamcinolone cream (KENALOG) 0.1 % Apply 1 application topically 2 (two) times daily as needed.     Alcohol Swabs (ALCOHOL WIPES) 70 % PADS by Does not apply route.     Blood Glucose Monitoring Suppl (ACCU-CHEK GUIDE) w/Device KIT by Does not apply route.     cyclobenzaprine (FLEXERIL) 10 MG tablet Take 10 mg by mouth 3 (three) times daily as needed for muscle spasms.     NARCAN 4 MG/0.1ML LIQD nasal spray kit  (Patient not taking: Reported on 12/09/2021)      Musculoskeletal: Strength & Muscle Tone: within normal limits Gait & Station: normal Patient leans: N/A  Psychiatric Specialty Exam:  Presentation  General Appearance: Appropriate for Environment; Casual  Eye Contact:Good  Speech:Clear and Coherent; Normal Rate  Speech Volume:Normal  Handedness:Right   Mood and Affect  Mood:Depressed  Affect:Appropriate; Congruent   Thought Process  Thought Processes:Coherent; Goal Directed  Descriptions of Associations:Intact  Orientation:Full (Time, Place and Person)  Thought Content:Logical; WDL  History of Schizophrenia/Schizoaffective disorder:No  Duration of Psychotic Symptoms:No data recorded Hallucinations:Hallucinations: None  Ideas of Reference:None  Suicidal Thoughts:Suicidal Thoughts:  No  Homicidal Thoughts:Homicidal Thoughts: No  Sensorium  Memory:Immediate Good; Recent Good; Remote Good  Judgment:Fair  Insight:Fair  Executive Functions  Concentration:Good  Attention Span:Good  [place] of Knowledge:Good  Language:Good  Psychomotor Activity  Psychomotor Activity:Psychomotor Activity: Normal  Assets  Assets:Communication Skills; Housing; Catering manager; Desire for Improvement; Vocational/Educational; Transportation  Sleep  Sleep:Sleep: Fair  Physical Exam: Physical Exam Vitals and nursing note reviewed.  Constitutional:      Appearance: Normal appearance.  HENT:     Head: Normocephalic and atraumatic.     Nose: Nose normal.  Eyes:     Pupils: Pupils are equal, round, and reactive to light.  Pulmonary:     Effort: Pulmonary effort is normal.  Musculoskeletal:        General: Normal range of motion.     Cervical back: Normal range of motion.  Neurological:     General: No focal deficit present.     Mental Status: She is alert and oriented to person, place, and time.  Psychiatric:        Attention and Perception: Attention and perception normal. She does not perceive auditory or visual hallucinations.        Mood and Affect: Mood is depressed.        Speech: Speech normal.        Behavior: Behavior normal. Behavior is cooperative.        Thought Content: Thought content normal. Thought content is not paranoid or delusional. Thought content does not include homicidal or suicidal ideation. Thought content does not include homicidal or suicidal plan.        Cognition and Memory: Cognition normal.  Review of Systems  Constitutional:  Negative for fever.  HENT:  Negative for congestion, sinus pain and sore throat.   Respiratory:  Negative for cough and shortness of breath.   Cardiovascular:  Negative for chest pain.  Musculoskeletal:        Chronic pain syndrome to bilateral legs due to venous insufficiency   Skin:         Elongated venous stasis ulcer on left medical ankle. Dry in appearance.  Neurological: Negative.    Blood pressure 135/75, pulse 80, temperature 98 F (36.7 C), temperature source Oral, resp. rate 18, SpO2 100 %. There is no height or weight on file to calculate BMI.  Treatment Plan Summary: Daily contact with patient to assess and evaluate symptoms and progress in treatment, Medication management, and Plan Patient is psychiatrically clear.   Disposition: No evidence of imminent risk to self or others at present.   Patient does not meet criteria for psychiatric inpatient admission. Supportive therapy provided about ongoing stressors. Discussed crisis plan, support from social network, calling 911, coming to the Emergency Department, and calling Suicide Hotline.  Patient was provided with outpatient resources for therapy if she decides in the future this would be helpful.   Ethelene Hal, NP 12/09/2021 2:03 PM

## 2021-12-09 NOTE — ED Provider Notes (Signed)
Patient was evaluated and cleared by behavioral psychiatry team.  Recommending continued outpatient care.  Advised return to the ER if they have worsening symptoms or have any additional concerns otherwise follow-up with her outpatient counselors within the next 2 to 3 days.   Cheryll Cockayne, MD 12/09/21 [phone]

## 2021-12-09 NOTE — ED Notes (Signed)
Patient approved to go by tax voucher approved by Clista Bernhardt, [facility]. Called [employer] at [phone] and they will arrive at approx. 25 minutes. Will call when they arrive.

## 2021-12-09 NOTE — ED Notes (Signed)
Pt resting comfortable in bed at this time. All comfort and safety measures in place in room with bed in lowest position.   ?

## 2021-12-09 NOTE — ED Notes (Signed)
Remains in room 30 waiting for TTY. Crying in severe leg pain.

## 2021-12-13 DIAGNOSIS — Z7984 Long term (current) use of oral hypoglycemic drugs: Secondary | ICD-10-CM | POA: Insufficient documentation

## 2021-12-13 DIAGNOSIS — I890 Lymphedema, not elsewhere classified: Secondary | ICD-10-CM | POA: Insufficient documentation

## 2021-12-13 DIAGNOSIS — M170 Bilateral primary osteoarthritis of knee: Secondary | ICD-10-CM | POA: Insufficient documentation

## 2021-12-29 ENCOUNTER — Other Ambulatory Visit (HOSPITAL_COMMUNITY): Payer: Self-pay | Admitting: Nurse Practitioner

## 2021-12-29 DIAGNOSIS — I739 Peripheral vascular disease, unspecified: Secondary | ICD-10-CM

## 2021-12-31 ENCOUNTER — Encounter (HOSPITAL_COMMUNITY): Payer: Medicare Other

## 2022-01-19 ENCOUNTER — Other Ambulatory Visit: Payer: Self-pay

## 2022-01-19 DIAGNOSIS — I739 Peripheral vascular disease, unspecified: Secondary | ICD-10-CM

## 2022-01-22 ENCOUNTER — Encounter (HOSPITAL_COMMUNITY): Payer: Medicare HMO

## 2022-01-22 ENCOUNTER — Encounter: Payer: Medicare HMO | Admitting: Vascular Surgery

## 2022-01-25 ENCOUNTER — Other Ambulatory Visit: Payer: Self-pay

## 2022-01-25 ENCOUNTER — Ambulatory Visit (INDEPENDENT_AMBULATORY_CARE_PROVIDER_SITE_OTHER): Payer: Medicare HMO | Admitting: Surgery

## 2022-01-25 ENCOUNTER — Ambulatory Visit (HOSPITAL_COMMUNITY)
Admission: RE | Admit: 2022-01-25 | Discharge: 2022-01-25 | Disposition: A | Discharge status: Home / Self Care | Payer: Medicare HMO | Source: Ambulatory Visit | Attending: Vascular Surgery | Admitting: Vascular Surgery

## 2022-01-25 ENCOUNTER — Encounter: Payer: Self-pay | Admitting: Surgery

## 2022-01-25 VITALS — BP 130/74 | HR 96 | Temp 97.8°F | Resp 14 | Ht 71.0 in | Wt 178.0 lb

## 2022-01-25 DIAGNOSIS — L97322 Non-pressure chronic ulcer of left ankle with fat layer exposed: Secondary | ICD-10-CM

## 2022-01-25 DIAGNOSIS — I739 Peripheral vascular disease, unspecified: Secondary | ICD-10-CM | POA: Insufficient documentation

## 2022-01-25 NOTE — Progress Notes (Signed)
[facility] [place]  Patient name: Diana Sanchez MRN: [id number] DOB: Jun 06, 1956 Sex: female   REQUESTING PROVIDER:    Cletis Athens   REASON FOR CONSULT:    Left pressure sore  HISTORY OF PRESENT ILLNESS:   Diana Sanchez is a 66 y.o. female, who is referred for evaluation of a left ankle wound.  The patient states that this is been present for approximately 5 months.  She has had 1 prior wound on her left ankle as well as 1 on her right ankle.  She states that she does have some swelling in her legs.  She has been treating this with a Ace compression wrap.  She states that she has been having issues since she had both knees replaced approximately 5 years ago.  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  During that time she was treated for DVT with anticoagulation which has subsequently been discontinued.  Patient takes a statin for hypercholesterolemia.  She is a non-smoker.  PAST MEDICAL HISTORY    Past Medical History:  Diagnosis Date   Arthritis    DVT of lower limb, acute (HCC)    FH: total knee replacement    Hypercholesteremia    Hypertension    Infection    in knee   Migraines      FAMILY HISTORY   History reviewed. No pertinent family history.  SOCIAL HISTORY:   Social History   Socioeconomic History   Marital status: Single    Spouse name: Not on file   Number of children: Not on file   Years of education: Not on file   Highest education level: Not on file  Occupational History   Not on file  Tobacco Use   Smoking status: Never   Smokeless tobacco: Never  Substance and Sexual Activity   Alcohol use: No   Drug use: Not on file   Sexual activity: Not on file  Other Topics Concern   Not on file  Social History Narrative   Not on file   Social Determinants of Health   Financial Resource Strain: Not on file  Food Insecurity:  Not on file  Transportation Needs: Not on file  Physical Activity: Not on file  Stress: Not on file  Social Connections: Not on file  Intimate Partner Violence: Not on file    ALLERGIES:    Allergies  Allergen Reactions   Tramadol     nausea Other reaction(s): Unknown    CURRENT MEDICATIONS:    Current Outpatient Medications  Medication Sig Dispense Refill   albuterol (PROVENTIL HFA;VENTOLIN HFA) 108 (90 Base) MCG/ACT inhaler Inhale into the lungs.     Alcohol Swabs (ALCOHOL WIPES) 70 % PADS by Does not apply route.     aspirin EC 81 MG tablet Take 81 mg by mouth daily.     Blood Glucose Monitoring Suppl (ACCU-CHEK GUIDE) w/Device KIT by Does not apply route.     cephALEXin (KEFLEX) 500 MG capsule Take 1 capsule (500 mg total) by mouth 4 (four) times daily. 28 capsule 0   cyclobenzaprine (FLEXERIL) 10 MG tablet Take 10 mg by mouth 3 (three) times daily as needed for muscle spasms.     fluocinolone (SYNALAR) 0.025 % ointment Apply topically 2 (two) times daily as needed.     LINZESS 72 MCG capsule Take 72 mcg by mouth daily before breakfast.     Multiple Vitamin (MULTIVITAMIN) tablet  Take 1 tablet by mouth daily.     oxyCODONE-acetaminophen (PERCOCET/ROXICET) 5-325 MG tablet Take 1 tablet by mouth every 6 (six) hours as needed for severe pain. 5 tablet 0   rizatriptan (MAXALT) 5 MG tablet Take 5 mg by mouth See admin instructions. TAKE TWO TABLETS (10 MG DOSE) BY MOUTH AS NEEDED FOR MIGRAINE. MAY REPEAT IN 2 HOURS IF NEEDED     rosuvastatin (CRESTOR) 20 MG tablet Take 20 mg by mouth daily.     triamcinolone cream (KENALOG) 0.1 % Apply 1 application topically 2 (two) times daily as needed.     doxycycline (VIBRAMYCIN) 100 MG capsule Take 100 mg by mouth 2 (two) times daily. (Patient not taking: Reported on 01/25/2022)     NARCAN 4 MG/0.1ML LIQD nasal spray kit  (Patient not taking: Reported on 12/09/2021)     No current facility-administered medications for this visit.     REVIEW OF SYSTEMS:   [X]  denotes positive finding, [ ]  denotes negative finding Cardiac  Comments:  Chest pain or chest pressure:    Shortness of breath upon exertion:    Short of breath when lying flat:    Irregular heart rhythm:        Vascular    Pain in calf, thigh, or hip brought on by ambulation:    Pain in feet at night that wakes you up from your sleep:     Blood clot in your veins:    Leg swelling:  x       Pulmonary    Oxygen at home:    Productive cough:     Wheezing:         Neurologic    Sudden weakness in arms or legs:     Sudden numbness in arms or legs:     Sudden onset of difficulty speaking or slurred speech:    Temporary loss of vision in one eye:     Problems with dizziness:         Gastrointestinal    Blood in stool:      Vomited blood:         Genitourinary    Burning when urinating:     Blood in urine:        Psychiatric    Major depression:         Hematologic    Bleeding problems:    Problems with blood clotting too easily:        Skin    Rashes or ulcers: x       Constitutional    Fever or chills:     PHYSICAL EXAM:   Vitals:   01/25/22 1216  BP: 130/74  Pulse: 96  Resp: 14  Temp: 97.8 F (36.6 C)  TempSrc: Temporal  SpO2: 99%  Weight: 178 lb (80.7 kg)  Height: 5' 11"  (1.803 m)    GENERAL: The patient is a well-nourished female, in no acute distress. The vital signs are documented above. CARDIAC: There is a regular rate and rhythm.  VASCULAR: Palpable pedal pulses PULMONARY: Nonlabored respirations ABDOMEN: Soft and non-tender with normal pitched bowel sounds.  MUSCULOSKELETAL: There are no major deformities or cyanosis. NEUROLOGIC: No focal weakness or paresthesias are detected. SKIN: See photo below PSYCHIAT RIC: The patient has a normal affect.  STUDIES:   I have reviewed the following: ABI Findings:  +---------+------------------+-----+---------+--------+   Right     Rt Pressure (mmHg) Index Waveform   Comment    +---------+------------------+-----+---------+--------+   Brachial  106                                           +---------+------------------+-----+---------+--------+  PTA       121                1.11  biphasic             +---------+------------------+-----+---------+--------+   DP        100                0.92  triphasic            +---------+------------------+-----+---------+--------+   Great Toe 94                 0.86  Normal               +---------+------------------+-----+---------+--------+   +---------+------------------+-----+---------+-------+   Left      Lt Pressure (mmHg) Index Waveform  Comment   +---------+------------------+-----+---------+-------+   Brachial  109                                          +---------+------------------+-----+---------+-------+   PTA       146                1.34  triphasic           +---------+------------------+-----+---------+-------+   DP        119                1.09  triphasic           +---------+------------------+-----+---------+-------+   Great Toe 66                 0.61  Abnormal            +---------+------------------+-----+---------+-------+   +-------+-----------+-----------+------------+------------+   ABI/TBI Today's ABI Today's TBI Previous ABI Previous TBI   +-------+-----------+-----------+------------+------------+   Right   1.11        0.86                                    +-------+-----------+-----------+------------+------------+   Left    1.34        0.61                                    +-------+-----------+-----------+------------+------------+   ASSESSMENT and PLAN   Left lower extremity wound: The patient has adequate arterial circulation to heal this wound.  She does have mild edema, which is the likely etiology of her wound.  I am going to place her into a Unna boot today for compression.  Hopefully we can get home health to manage this as an outpatient.  I am also referring her to  the wound center for long-term management of this.  She will come back to see me in 1 month.  I will get a venous reflux exam to see if she would benefit from laser ablation of her saphenous vein if it is incompetent.   Leia Alf, MD, FACS [facility] [phone] Pager [phone]

## 2022-01-29 ENCOUNTER — Other Ambulatory Visit: Payer: Self-pay | Admitting: *Deleted

## 2022-01-29 DIAGNOSIS — L97322 Non-pressure chronic ulcer of left ankle with fat layer exposed: Secondary | ICD-10-CM

## 2022-01-29 DIAGNOSIS — I739 Peripheral vascular disease, unspecified: Secondary | ICD-10-CM

## 2022-02-05 ENCOUNTER — Ambulatory Visit: Payer: Medicare HMO | Admitting: Physician Assistant

## 2022-02-09 ENCOUNTER — Ambulatory Visit: Payer: Medicare HMO | Admitting: Physician Assistant

## 2022-02-22 ENCOUNTER — Other Ambulatory Visit: Payer: Self-pay

## 2022-02-22 ENCOUNTER — Ambulatory Visit (HOSPITAL_COMMUNITY)
Admission: RE | Admit: 2022-02-22 | Discharge: 2022-02-22 | Disposition: A | Discharge status: Home / Self Care | Payer: Medicare HMO | Source: Ambulatory Visit | Attending: Surgery | Admitting: Surgery

## 2022-02-22 ENCOUNTER — Encounter: Payer: Self-pay | Admitting: Surgery

## 2022-02-22 ENCOUNTER — Ambulatory Visit (INDEPENDENT_AMBULATORY_CARE_PROVIDER_SITE_OTHER): Payer: Medicare HMO | Admitting: Surgery

## 2022-02-22 VITALS — BP 101/60 | HR 89 | Temp 97.9°F | Resp 20 | Ht 71.0 in | Wt 183.0 lb

## 2022-02-22 DIAGNOSIS — L97322 Non-pressure chronic ulcer of left ankle with fat layer exposed: Secondary | ICD-10-CM | POA: Insufficient documentation

## 2022-02-22 DIAGNOSIS — I739 Peripheral vascular disease, unspecified: Secondary | ICD-10-CM | POA: Diagnosis not present

## 2022-02-22 NOTE — Progress Notes (Signed)
? ?[facility] ? ?Patient name: Diana Sanchez MRN: [id number] DOB: 28-Dec-1955 Sex: female ? ? ?REASON FOR VISIT:  ? ? ?Follow up ? ?HISOTRY OF PRESENT ILLNESS:  ? ? ?Diana Sanchez is a 66 y.o. female, who I saw for a  left ankle wound. The patient states that this is been present for approximately 6 months.  She has had 1 prior wound on her left ankle as well as 1 on her right ankle.  She states that she does have some swelling in her legs.  She has been treating this with a Ace compression wrap.  She states that she has been having issues since she had both knees replaced approximately 5 years ago.  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  During that time she was treated for DVT with anticoagulation which has subsequently been discontinued.  She had ABI's which were normal with triphasic waveforms, but toe pressures were down.  I placed her in UNNA boots and referred her to the wound center.  She is back today for venous reflux testing and follow up.  She continues to have pain at her wound site, and has not improved. ? ?Patient takes a statin for hypercholesterolemia.  She is a non-smoker. ? ? ?PAST MEDICAL HISTORY:  ? ?Past Medical History:  ?Diagnosis Date  ? Arthritis   ? DVT of lower limb, acute ([place])   ? FH: total knee replacement   ? Hypercholesteremia   ? Hypertension   ? Infection   ? in knee  ? Migraines   ? ? ? ?FAMILY HISTORY:  ? ?History reviewed. No pertinent family history. ? ?SOCIAL HISTORY:  ? ?Social History  ? ?Tobacco Use  ? Smoking status: Never  ? Smokeless tobacco: Never  ?Substance Use Topics  ? Alcohol use: No  ? ? ? ?ALLERGIES:  ? ?Allergies  ?Allergen Reactions  ? Tramadol   ?  nausea ?Other reaction(s): Unknown  ? ? ? ?CURRENT MEDICATIONS:  ? ?Current Outpatient Medications  ?Medication Sig Dispense Refill  ? albuterol (PROVENTIL HFA;VENTOLIN HFA) 108 (90  Base) MCG/ACT inhaler Inhale into the lungs.    ? Alcohol Swabs (ALCOHOL WIPES) 70 % PADS by Does not apply route.    ? aspirin EC 81 MG tablet Take 81 mg by mouth daily.    ? Blood Glucose Monitoring Suppl (ACCU-CHEK GUIDE) w/Device KIT by Does not apply route.    ? cyclobenzaprine (FLEXERIL) 10 MG tablet Take 10 mg by mouth 3 (three) times daily as needed for muscle spasms.    ? fluocinolone (SYNALAR) 0.025 % ointment Apply topically 2 (two) times daily as needed.    ? LINZESS 72 MCG capsule Take 72 mcg by mouth daily before breakfast.    ? Multiple Vitamin (MULTIVITAMIN) tablet Take 1 tablet by mouth daily.    ? NARCAN 4 MG/0.1ML LIQD nasal spray kit     ? rizatriptan (MAXALT) 5 MG tablet Take 5 mg by mouth See admin instructions. TAKE TWO TABLETS (10 MG DOSE) BY MOUTH AS NEEDED FOR MIGRAINE. MAY REPEAT IN 2 HOURS IF NEEDED    ? rosuvastatin (CRESTOR) 20 MG tablet Take 20 mg by mouth daily.    ? triamcinolone cream (KENALOG) 0.1 % Apply 1 application topically 2 (two) times daily as needed.    ? gabapentin (NEURONTIN) 100 MG capsule Take by mouth.    ? oxyCODONE-acetaminophen (PERCOCET/ROXICET) 5-325 MG tablet  Take 1 tablet by mouth every 6 (six) hours as needed for severe pain. (Patient not taking: Reported on 02/22/2022) 5 tablet 0  ? SANTYL 250 UNIT/GM ointment Apply topically.    ? ?No current facility-administered medications for this visit.  ? ? ?REVIEW OF SYSTEMS:  ? ?[X] denotes positive finding, [ ] denotes negative finding ?Cardiac  Comments:  ?Chest pain or chest pressure:    ?Shortness of breath upon exertion:    ?Short of breath when lying flat:    ?Irregular heart rhythm:    ?    ?Vascular    ?Pain in calf, thigh, or hip brought on by ambulation:    ?Pain in feet at night that wakes you up from your sleep:     ?Blood clot in your veins:    ?Leg swelling:  x   ?    ?Pulmonary    ?Oxygen at home:    ?Productive cough:     ?Wheezing:     ?    ?Neurologic    ?Sudden weakness in arms or legs:     ?Sudden  numbness in arms or legs:     ?Sudden onset of difficulty speaking or slurred speech:    ?Temporary loss of vision in one eye:     ?Problems with dizziness:     ?    ?Gastrointestinal    ?Blood in stool:     ?Vomited blood:     ?    ?Genitourinary    ?Burning when urinating:     ?Blood in urine:    ?    ?Psychiatric    ?Major depression:     ?    ?Hematologic    ?Bleeding problems:    ?Problems with blood clotting too easily:    ?    ?Skin    ?Rashes or ulcers: x   ?    ?Constitutional    ?Fever or chills:    ? ? ?PHYSICAL EXAM:  ? ?Vitals:  ? 02/22/22 0857  ?BP: 101/60  ?Pulse: 89  ?Resp: 20  ?Temp: 97.9 ?F (36.6 ?C)  ?SpO2: 98%  ?Weight: 183 lb (83 kg)  ?Height: 5' 11" (1.803 m)  ? ? ?GENERAL: The patient is a well-nourished female, in no acute distress. The vital signs are documented above. ?CARDIAC: There is a regular rate and rhythm.  ?VASCULAR: Palpable dorsalis pedis pulse ?PULMONARY: Non-labored respirations ?ABDOMEN: Soft and non-tender with normal pitched bowel sounds.  ?MUSCULOSKELETAL: There are no major deformities or cyanosis. ?NEUROLOGIC: No focal weakness or paresthesias are detected. ?SKIN: See photo below ?PSYCHIATRIC: The patient has a normal affect. ? ? ?STUDIES:  ? ?I have reviewed the following: ?+------------------+---------+------+-----------+------------+-------------  ?---+  ?LEFT              Reflux NoRefluxReflux TimeDiameter cmsComments      ?     ?                            Yes                                       ?     ?+------------------+---------+------+-----------+------------+-------------  ?---+  ?CFV                         yes   >1 second                           ?     ?+------------------+---------+------+-----------+------------+-------------  ?---+  ?  FV mid                      yes   >1 second                           ?     ?+------------------+---------+------+-----------+------------+-------------  ?---+  ?Popliteal                   yes    >1 second                           ?     ?+------------------+---------+------+-----------+------------+-------------  ?---+  ?GSV at SFJ                  yes    >500 ms      0.72                  ?     ?+------------------+---------+------+-----------+------------+-------------  ?---+  ?GSV prox thigh    no                            0.29                  ?     ?+------------------+---------+------+-----------+------------+-------------  ?---+  ?GSV mid thigh     no                            0.16                  ?     ?+------------------+---------+------+-----------+------------+-------------  ?---+  ?GSV dist thigh    no                            0.20                  ?     ?+------------------+---------+------+-----------+------------+-------------  ?---+  ?SSV Pop Fossa     no                            0.37                  ?     ?+------------------+---------+------+-----------+------------+-------------  ?---+  ?anterior accessoryno                            0.55                  ?     ?+------------------+---------+------+-----------+------------+-------------  ?---+  ?pelic varicosity                                        chronic  ?thrombus  ?+------------------+---------+------+-----------+------------+-------------  ?---+  ?MEDICAL ISSUES:  ? ?Left ankle ulcer: The patient has a palpable pulse and normal ankle-brachial indices.  Venous reflux testing today was unremarkable.  It is unclear to me exactly the etiology of her ulcer given that it is not a vascular issue.  She does have edema in her leg which has been much better controlled.  She is going to get her care at the   wound center.  This probably needs to be biopsied and then re-debrided, and be considered for skin substitute skin graft. ? ? ? ?Annamarie Major, IV, MD, FACS ?[facility] ?Tel [phone] ?Pager [phone]  ?

## 2022-03-05 ENCOUNTER — Ambulatory Visit: Payer: Medicare Other | Admitting: Podiatry

## 2022-03-08 ENCOUNTER — Ambulatory Visit: Payer: Medicare Other | Admitting: Podiatry

## 2022-03-29 ENCOUNTER — Encounter (HOSPITAL_COMMUNITY): Payer: Self-pay

## 2022-03-29 ENCOUNTER — Observation Stay (HOSPITAL_COMMUNITY)
Admission: EM | Admit: 2022-03-29 | Discharge: 2022-03-30 | Disposition: A | Discharge status: Home Health | Payer: Medicare HMO | Attending: Family Medicine | Admitting: Family Medicine

## 2022-03-29 DIAGNOSIS — M79662 Pain in left lower leg: Secondary | ICD-10-CM | POA: Diagnosis present

## 2022-03-29 DIAGNOSIS — Z66 Do not resuscitate: Secondary | ICD-10-CM

## 2022-03-29 DIAGNOSIS — Z79899 Other long term (current) drug therapy: Secondary | ICD-10-CM | POA: Diagnosis not present

## 2022-03-29 DIAGNOSIS — Z96659 Presence of unspecified artificial knee joint: Secondary | ICD-10-CM | POA: Insufficient documentation

## 2022-03-29 DIAGNOSIS — Z7982 Long term (current) use of aspirin: Secondary | ICD-10-CM | POA: Insufficient documentation

## 2022-03-29 DIAGNOSIS — L88 Pyoderma gangrenosum: Secondary | ICD-10-CM

## 2022-03-29 DIAGNOSIS — L97909 Non-pressure chronic ulcer of unspecified part of unspecified lower leg with unspecified severity: Secondary | ICD-10-CM | POA: Diagnosis present

## 2022-03-29 DIAGNOSIS — D72829 Elevated white blood cell count, unspecified: Secondary | ICD-10-CM | POA: Diagnosis not present

## 2022-03-29 DIAGNOSIS — L97929 Non-pressure chronic ulcer of unspecified part of left lower leg with unspecified severity: Principal | ICD-10-CM | POA: Insufficient documentation

## 2022-03-29 DIAGNOSIS — E875 Hyperkalemia: Secondary | ICD-10-CM | POA: Diagnosis not present

## 2022-03-29 DIAGNOSIS — I83892 Varicose veins of left lower extremities with other complications: Secondary | ICD-10-CM | POA: Diagnosis present

## 2022-03-29 DIAGNOSIS — Z23 Encounter for immunization: Secondary | ICD-10-CM | POA: Diagnosis not present

## 2022-03-29 DIAGNOSIS — L089 Local infection of the skin and subcutaneous tissue, unspecified: Secondary | ICD-10-CM

## 2022-03-29 LAB — CBC WITH DIFFERENTIAL/PLATELET
Abs Immature Granulocytes: 0.06 10*3/uL (ref 0.00–0.07)
Basophils Absolute: 0 10*3/uL (ref 0.0–0.1)
Basophils Relative: 0 %
Eosinophils Absolute: 0.1 10*3/uL (ref 0.0–0.5)
Eosinophils Relative: 1 %
HCT: 32.4 % — ABNORMAL LOW (ref 36.0–46.0)
Hemoglobin: 9.9 g/dL — ABNORMAL LOW (ref 12.0–15.0)
Immature Granulocytes: 0 %
Lymphocytes Relative: 8 %
Lymphs Abs: 1.2 10*3/uL (ref 0.7–4.0)
MCH: 26.3 pg (ref 26.0–34.0)
MCHC: 30.6 g/dL (ref 30.0–36.0)
MCV: 86.2 fL (ref 80.0–100.0)
Monocytes Absolute: 0.5 10*3/uL (ref 0.1–1.0)
Monocytes Relative: 3 %
Neutro Abs: 14.4 10*3/uL — ABNORMAL HIGH (ref 1.7–7.7)
Neutrophils Relative %: 88 %
Platelets: 192 10*3/uL (ref 150–400)
RBC: 3.76 MIL/uL — ABNORMAL LOW (ref 3.87–5.11)
RDW: 20.6 % — ABNORMAL HIGH (ref 11.5–15.5)
WBC: 16.3 10*3/uL — ABNORMAL HIGH (ref 4.0–10.5)
nRBC: 0 % (ref 0.0–0.2)

## 2022-03-29 LAB — BASIC METABOLIC PANEL
Anion gap: 6 (ref 5–15)
BUN: 28 mg/dL — ABNORMAL HIGH (ref 8–23)
CO2: 27 mmol/L (ref 22–32)
Calcium: 9 mg/dL (ref 8.9–10.3)
Chloride: 106 mmol/L (ref 98–111)
Creatinine, Ser: 0.96 mg/dL (ref 0.44–1.00)
GFR, Estimated: >60 mL/min (ref >60)
Glucose, Bld: 103 mg/dL — ABNORMAL HIGH (ref 70–99)
Potassium: 5.9 mmol/L — ABNORMAL HIGH (ref 3.5–5.1)
Sodium: 139 mmol/L (ref 135–145)

## 2022-03-29 NOTE — ED Provider Triage Note (Signed)
Emergency Medicine Provider Triage Evaluation Note ? ?Diana Sanchez , a 66 y.o. female  was evaluated in triage.  Pt complains of acute on chronic pain of a left medial malleolus leg wound.  Chronically takes oxycodone 10 mg 3 times daily for pain management.  Stated this was not helping over the last 2 days.  Had her ankle wrapped at primary care to this morning and was encouraged to come to the ED.  Says this is happened before, where she received a "pain shot" and was sent on her way.  Denies fever, shortness of breath, chest pain, numbness or tingling of the lower extremity. ? ?Review of Systems  ?Positive: As above ?Negative: As above ? ?Physical Exam  ?BP 135/80 (BP Location: Left Arm)   Pulse 86   Temp 98.2 ?F (36.8 ?C) (Oral)   Resp 20   SpO2 100%  ?Gen:   Awake, no distress   ?Resp:  Normal effort  ?MSK:   Moves extremities without difficulty  ?Other:  RRR w/o M/R/G; Full ROM of LLE; 1+ DP LE bilat ? ?Medical Decision Making  ?Medically screening exam initiated at 1:59 PM.  Appropriate orders placed.  Ezra Katlin Bortner was informed that the remainder of the evaluation will be completed by another provider, this initial triage assessment does not replace that evaluation, and the importance of remaining in the ED until their evaluation is complete. ? ?Labs ordered ?  ?Cecil Cobbs, PA-C ?03/29/22 1415 ? ?

## 2022-03-29 NOTE — ED Triage Notes (Signed)
Pt arrived via EMS, from home, has wound left lower leg that she has wound care for at home. Pain worsening.  X2 10mg  oxycodone at 10 and PTA. Pain still severe.  ?

## 2022-03-30 ENCOUNTER — Emergency Department (HOSPITAL_COMMUNITY): Payer: Medicare HMO

## 2022-03-30 ENCOUNTER — Encounter (HOSPITAL_COMMUNITY): Payer: Self-pay | Admitting: Emergency Medicine

## 2022-03-30 ENCOUNTER — Other Ambulatory Visit: Payer: Self-pay

## 2022-03-30 DIAGNOSIS — L97909 Non-pressure chronic ulcer of unspecified part of unspecified lower leg with unspecified severity: Secondary | ICD-10-CM | POA: Diagnosis not present

## 2022-03-30 DIAGNOSIS — L97929 Non-pressure chronic ulcer of unspecified part of left lower leg with unspecified severity: Secondary | ICD-10-CM | POA: Diagnosis not present

## 2022-03-30 DIAGNOSIS — D72829 Elevated white blood cell count, unspecified: Secondary | ICD-10-CM

## 2022-03-30 DIAGNOSIS — Z66 Do not resuscitate: Secondary | ICD-10-CM

## 2022-03-30 DIAGNOSIS — E875 Hyperkalemia: Secondary | ICD-10-CM

## 2022-03-30 DIAGNOSIS — L88 Pyoderma gangrenosum: Secondary | ICD-10-CM | POA: Diagnosis not present

## 2022-03-30 LAB — BASIC METABOLIC PANEL
Anion gap: 9 (ref 5–15)
BUN: 30 mg/dL — ABNORMAL HIGH (ref 8–23)
CO2: 24 mmol/L (ref 22–32)
Calcium: 9 mg/dL (ref 8.9–10.3)
Chloride: 105 mmol/L (ref 98–111)
Creatinine, Ser: 0.86 mg/dL (ref 0.44–1.00)
GFR, Estimated: >60 mL/min (ref >60)
Glucose, Bld: 113 mg/dL — ABNORMAL HIGH (ref 70–99)
Potassium: 4.4 mmol/L (ref 3.5–5.1)
Sodium: 138 mmol/L (ref 135–145)

## 2022-03-30 LAB — HIV ANTIBODY (ROUTINE TESTING W REFLEX): HIV Screen 4th Generation wRfx: NONREACTIVE

## 2022-03-30 MED ORDER — HYDROMORPHONE HCL 2 MG PO TABS
4.0000 mg | ORAL_TABLET | ORAL | Status: DC | PRN
Start: 2022-03-30 — End: 2022-03-30

## 2022-03-30 MED ORDER — ONDANSETRON HCL 4 MG/2ML IJ SOLN
4.0000 mg | Freq: Four times a day (QID) | INTRAMUSCULAR | Status: DC | PRN
Start: 2022-03-30 — End: 2022-03-30

## 2022-03-30 MED ORDER — PIPERACILLIN-TAZOBACTAM 3.375 G IVPB 30 MIN
3.3750 g | Freq: Once | INTRAVENOUS | Status: AC
  Administered 2022-03-30: 3.375 g via INTRAVENOUS
  Filled 2022-03-30: qty 50

## 2022-03-30 MED ORDER — GABAPENTIN 100 MG PO CAPS
100.0000 mg | ORAL_CAPSULE | Freq: Two times a day (BID) | ORAL | Status: DC
  Administered 2022-03-30: 100 mg via ORAL
  Filled 2022-03-30: qty 1

## 2022-03-30 MED ORDER — ONDANSETRON HCL 4 MG PO TABS: 4.0000 mg | ORAL_TABLET | Freq: Four times a day (QID) | ORAL | Status: DC | PRN

## 2022-03-30 MED ORDER — ASPIRIN EC 81 MG PO TBEC
81.0000 mg | DELAYED_RELEASE_TABLET | Freq: Every day | ORAL | Status: DC
Start: 2022-03-30 — End: 2022-03-30
  Administered 2022-03-30: 81 mg via ORAL
  Filled 2022-03-30: qty 1

## 2022-03-30 MED ORDER — PREDNISONE 20 MG PO TABS
60.0000 mg | ORAL_TABLET | Freq: Every day | ORAL | Status: DC
  Administered 2022-03-30: 60 mg via ORAL
  Filled 2022-03-30: qty 3

## 2022-03-30 MED ORDER — SILVER SULFADIAZINE 1 % EX CREA
TOPICAL_CREAM | Freq: Every day | CUTANEOUS | Status: DC
Start: 2022-03-30 — End: 2022-03-30
  Filled 2022-03-30: qty 50

## 2022-03-30 MED ORDER — ACETAMINOPHEN 650 MG RE SUPP: 650.0000 mg | Freq: Four times a day (QID) | RECTAL | Status: DC | PRN

## 2022-03-30 MED ORDER — ROSUVASTATIN CALCIUM 20 MG PO TABS
20.0000 mg | ORAL_TABLET | Freq: Every day | ORAL | Status: DC
Start: 2022-03-30 — End: 2022-03-30
  Administered 2022-03-30: 20 mg via ORAL
  Filled 2022-03-30: qty 1

## 2022-03-30 MED ORDER — VANCOMYCIN HCL IN DEXTROSE 1-5 GM/200ML-% IV SOLN
1000.0000 mg | Freq: Once | INTRAVENOUS | Status: AC
  Administered 2022-03-30: 1000 mg via INTRAVENOUS
  Filled 2022-03-30: qty 200

## 2022-03-30 MED ORDER — HYDROMORPHONE HCL 2 MG/ML IJ SOLN
2.0000 mg | Freq: Once | INTRAMUSCULAR | Status: AC
  Administered 2022-03-30: 2 mg via INTRAVENOUS
  Filled 2022-03-30: qty 1

## 2022-03-30 MED ORDER — CEPHALEXIN 500 MG PO CAPS
500.0000 mg | ORAL_CAPSULE | Freq: Three times a day (TID) | ORAL | Status: DC
  Administered 2022-03-30 (×2): 500 mg via ORAL
  Filled 2022-03-30 (×2): qty 1

## 2022-03-30 MED ORDER — HYDROGEN PEROXIDE 3 % EX SOLN
CUTANEOUS | Status: AC
  Filled 2022-03-30: qty 473

## 2022-03-30 MED ORDER — HEPARIN SODIUM (PORCINE) 5000 UNIT/ML IJ SOLN
5000.0000 [IU] | Freq: Three times a day (TID) | INTRAMUSCULAR | Status: DC
  Administered 2022-03-30 (×2): 5000 [IU] via SUBCUTANEOUS
  Filled 2022-03-30 (×2): qty 1

## 2022-03-30 MED ORDER — CEFADROXIL 500 MG PO CAPS
500.0000 mg | ORAL_CAPSULE | Freq: Two times a day (BID) | ORAL | 0 refills | Status: AC
Start: 2022-03-30 — End: 2022-04-05

## 2022-03-30 MED ORDER — KETOROLAC TROMETHAMINE 30 MG/ML IJ SOLN
30.0000 mg | Freq: Once | INTRAMUSCULAR | Status: AC
Start: 2022-03-30 — End: 2022-03-30
  Administered 2022-03-30: 30 mg via INTRAVENOUS
  Filled 2022-03-30: qty 1

## 2022-03-30 MED ORDER — TETANUS-DIPHTH-ACELL PERTUSSIS 5-2.5-18.5 LF-MCG/0.5 IM SUSY
0.5000 mL | PREFILLED_SYRINGE | Freq: Once | INTRAMUSCULAR | Status: AC
  Administered 2022-03-30: 0.5 mL via INTRAMUSCULAR
  Filled 2022-03-30: qty 0.5

## 2022-03-30 MED ORDER — ACETAMINOPHEN 325 MG PO TABS: 650.0000 mg | ORAL_TABLET | Freq: Four times a day (QID) | ORAL | Status: DC | PRN

## 2022-03-30 MED ORDER — NALOXONE HCL 0.4 MG/ML IJ SOLN: 0.4000 mg | INTRAMUSCULAR | Status: DC | PRN

## 2022-03-30 NOTE — Consult Note (Signed)
WOC Nurse Consult Note: ?Reason for Consult:Patient with left medial malleolus wound, chronic, nonhealing. Is followed closely in the community by Drs. Orvan Falconer (Vascular) and M. Phylis Bougie ([place]). Last seen in the outpatient wound care center on 03/16/22. ?Wound type: Autoimmune, biopsy confirmed pyoderma gangrenosum ([facility]) ?Pressure Injury POA: N/A ?Measurement:8cm x 6cm x 0.3cm ?Wound bed:red, small amount slough ?Drainage (amount, consistency, odor) small to moderate ?Periwound:intact with evidence of previous wound healing (scarring, discoloration) ?Dressing procedure/placement/frequency: I will continue the POC previously in place. Patient's wound is painful.  ?Cleansing is with NS, patting surrounding skin dry. Silvadene Cream applied in a 1/8 inch layer and topped with a saline moistened gauze dressing has been in use and patient performs at home. This is additionally covered with an ABD pad and secured with a Kerlix roll gauze wrap topped with an ACE bandage.  ? ?A silicone foam is to be placed for PI prevention and the heels floated. ? ?If additional guidance is desired, please consult Vascular Surgery (Dr. Trula Slade) while in house.  ? ?[facility] nursing team will not follow, but will remain available to this patient, the nursing and medical teams.  Please re-consult if needed. ?Thanks, ?Maudie Flakes, MSN, RN, [place], [place], CWON-AP, [place]  ?Pager# [phone]  ? ? ? ?  ?

## 2022-03-30 NOTE — ED Notes (Signed)
Wound to Lt leg cleaned and nonadherent dressing applied ?

## 2022-03-30 NOTE — H&P (Signed)
?History and Physical  ? ? ?Diana Sanchez KYH:[id number] DOB: March 17, 1956 DOA: 03/29/2022 ? ?DOS: the patient was seen and examined on 03/29/2022 ? ?PCP: Willene Hatchet, NP  ? ?Patient coming from: Home ? ?I have personally briefly reviewed patient's old medical records in [place] ? 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  She has been taking 60 mg of prednisone for several weeks now.  She had a biopsy performed by dermatology on March 03, 2022.  This diagnosed her with pyoderma gangrenosum.  She has been on steroids since then.  She states that she has not been taking any antibiotics recently.  She states the pain has been increasing in intensity.  She has been on Percocet 10/325 3-4 times a day but still having a lot of pain.  She came to the ER for pain relief. ? ? ?Patient lives in [place] and her PCP is with [facility] however all of her subspecialty care regarding her ulcer is in [place] (wound care clinic), [facility] (dermatology, vascular surgery). ? ?Patient denies any recent fever, chills.  She states that there has been increased amount of drainage from the ulcer. ? ?She had a vascular work-up including ABIs and toe pressures.  This was performed in January 2023.  This was performed at [facility].  She had toe pressures on the left side of 85 mmHg.  This was felt to be adequate for wound healing. ? ?[facility] hospitalist contacted for admission due to increased pain.  ? ?ED Course: given 2 mg IV dilaudid with good relief in pain. Wound cleaned with H2O2 and wrapped with vaseline gauze and kerlex. ? ?Review of Systems:  ?Review of Systems  ?Constitutional: Negative.   ?HENT: Negative.    ?Eyes: Negative.   ?Respiratory: Negative.    ?Cardiovascular: Negative.    ?Gastrointestinal: Negative.   ?Genitourinary: Negative.   ?Musculoskeletal:   ?     Increase in left leg pain despite po percocet  ?Skin:   ?     Increase in drainage from left leg wound.  ?Neurological: Negative.   ?Endo/Heme/Allergies: Negative.   ?Psychiatric/Behavioral: Negative.    ?All other systems reviewed and are negative. ? ?Past Medical History:  ?Diagnosis Date  ? Arthritis   ? DVT of lower limb, acute ([place])   ? FH: total knee replacement   ? H/O fracture of hip 03/21/2020  ? Hypercholesteremia   ? Hypertension   ? Infection   ? in knee  ? Migraines   ? ? ?Past Surgical History:  ?Procedure Laterality Date  ? CESAREAN SECTION    ? HIP SURGERY    ? TOTAL KNEE ARTHROPLASTY    ? ? ? reports that she has never smoked. She has never used smokeless tobacco. She reports that she does not drink alcohol. No history on file for drug use. ? ?Allergies  ?Allergen Reactions  ? Tramadol   ?  nausea ?Other reaction(s): Unknown  ? ? ?History reviewed. No pertinent family history. ? ?Prior to Admission medications   ?Medication Sig Start Date End Date Taking? Authorizing Provider  ?Ascorbic Acid (VITAMIN C PO) Take 1 tablet by mouth daily.   Yes [provider]  ?aspirin EC 81 MG tablet Take 81 mg by mouth daily.   Yes [provider]  ?  clobetasol ointment (TEMOVATE) 0.35 % Apply 1 application. topically every other day. 03/09/22  Yes [provider]  ?[employer] ACETAMINOPHEN EX ST 500 MG tablet Take 500 mg by mouth 2 (two) times daily as needed for moderate pain. 03/06/22  Yes [provider]  ?cyclobenzaprine (FLEXERIL) 10 MG tablet Take 10 mg by mouth 3 (three) times daily as needed for muscle spasms.   Yes [provider]  ?gabapentin (NEURONTIN) 100 MG capsule Take 100 mg by mouth 2 (two) times daily. 01/15/22  Yes [provider]  ?Rolan Lipa 72 MCG capsule Take 72 mcg by mouth daily before breakfast. 01/12/19  Yes [provider]  ?Multiple Vitamin (MULTIVITAMIN)  tablet Take 1 tablet by mouth daily.   Yes [provider]  ?oxyCODONE-acetaminophen (PERCOCET) 10-325 MG tablet Take 1 tablet by mouth every 8 (eight) hours as needed. for pain 03/16/22  Yes [provider]  ?predniSONE (DELTASONE) 20 MG tablet Take 60 mg by mouth daily. 03/09/22  Yes [provider]  ?rizatriptan (MAXALT) 5 MG tablet Take 5 mg by mouth See admin instructions. TAKE TWO TABLETS (10 MG DOSE) BY MOUTH AS NEEDED FOR MIGRAINE. MAY REPEAT IN 2 HOURS IF NEEDED   Yes [provider]  ?rosuvastatin (CRESTOR) 20 MG tablet Take 20 mg by mouth at bedtime. 10/07/21  Yes [provider]  ?silver sulfADIAZINE (SILVADENE) 1 % cream Apply 1 application. topically every other day. 03/09/22  Yes [provider]  ?triamcinolone cream (KENALOG) 0.1 % Apply 1 application. topically 2 (two) times daily as needed (for itching). 11/19/21  Yes [provider]  ?albuterol (PROVENTIL HFA;VENTOLIN HFA) 108 (90 Base) MCG/ACT inhaler Inhale into the lungs. ?Patient not taking: Reported on 03/30/2022 02/14/12   [provider]  ?Alcohol Swabs (ALCOHOL WIPES) 70 % PADS by Does not apply route. 04/04/20   [provider]  ?Blood Glucose Monitoring Suppl (ACCU-CHEK GUIDE) w/Device KIT by Does not apply route. 04/04/20   [provider]  ?Karma Greaser 4 MG/0.1ML LIQD nasal spray kit  08/09/18   [provider]  ? ? ?Physical Exam: ?Vitals:  ? 03/29/22 2324 03/30/22 0026 03/30/22 0028 03/30/22 0030  ?BP: 139/79 (!) 146/87  (!) 141/83  ?Pulse: 91  83 82  ?Resp: 18   18  ?Temp: 98.1 ?F (36.7 ?C)     ?TempSrc: Oral     ?SpO2: 100%  100% 100%  ? ? ?Physical Exam ?Vitals and nursing note reviewed.  ?Constitutional:   ?   General: She is not in acute distress. ?   Appearance: She is not toxic-appearing or diaphoretic.  ?   Comments: Appears in pain  ?HENT:  ?   Head: Normocephalic and atraumatic.  ?   Nose: Nose normal.  ?Cardiovascular:  ?   Rate and Rhythm:  Normal rate and regular rhythm.  ?Pulmonary:  ?   Effort: Pulmonary effort is normal.  ?   Breath sounds: Normal breath sounds.  ?Abdominal:  ?   General: Abdomen is flat. Bowel sounds are normal. There is no distension.  ?   Tenderness: There is no abdominal tenderness. There is no guarding.  ?Musculoskeletal:  ?   Right lower leg: No edema.  ?   Left lower leg: No edema.  ?Skin: ?   Capillary Refill: Capillary refill takes less than 2 seconds.  ?   Comments: See pictures of ulcer today.  ?Neurological:  ?   Mental Status: She is alert.  ?  ? ? ?Labs on  Admission: I have personally reviewed following labs and imaging studies ? ?CBC: ?Recent Labs  ?Lab 03/29/22 ?1430  ?WBC 16.3*  ?NEUTROABS 14.4*  ?HGB 9.9*  ?HCT 32.4*  ?MCV 86.2  ?PLT 192  ? ?Basic Metabolic Panel: ?Recent Labs  ?Lab 03/29/22 ?1430 03/30/22 ?0120  ?NA 139 138  ?K 5.9* 4.4  ?CL 106 105  ?CO2 27 24  ?GLUCOSE 103* 113*  ?BUN 28* 30*  ?CREATININE 0.96 0.86  ?CALCIUM 9.0 9.0  ? ?GFR: ?CrCl cannot be calculated (Unknown ideal weight.). ?Liver Function Tests: ?No results for input(s): AST, ALT, ALKPHOS, BILITOT, PROT, ALBUMIN in the last 168 hours. ?No results for input(s): LIPASE, AMYLASE in the last 168 hours. ?No results for input(s): AMMONIA in the last 168 hours. ?Coagulation Profile: ?No results for input(s): INR, PROTIME in the last 168 hours. ?Cardiac Enzymes: ?No results for input(s): CKTOTAL, CKMB, CKMBINDEX, TROPONINI, TROPONINIHS in the last 168 hours. ?BNP (last 3 results) ?No results for input(s): PROBNP in the last 8760 hours. ?HbA1C: ?No results for input(s): HGBA1C in the last 72 hours. ?CBG: ?No results for input(s): GLUCAP in the last 168 hours. ?Lipid Profile: ?No results for input(s): CHOL, HDL, LDLCALC, TRIG, CHOLHDL, LDLDIRECT in the last 72 hours. ?Thyroid Function Tests: ?No results for input(s): TSH, T4TOTAL, FREET4, T3FREE, THYROIDAB in the last 72 hours. ?Anemia Panel: ?No results for input(s): VITAMINB12, FOLATE, FERRITIN,  TIBC, IRON, RETICCTPCT in the last 72 hours. ?Urine analysis: ?No results found for: COLORURINE, APPEARANCEUR, LABSPEC, PHURINE, GLUCOSEU, HGBUR, BILIRUBINUR, KETONESUR, PROTEINUR, UROBILINOGEN, NITRITE, LEUKOCYTES

## 2022-03-30 NOTE — Assessment & Plan Note (Signed)
>>  ASSESSMENT AND PLAN FOR CHRONIC SKIN ULCER OF LOWER LEG (HCC) WRITTEN ON 03/30/2022  1:54 PM BY NETTEY, RALPH A, MD  Concern for possible acute infection. Patient was initially started on Vancomycin  and Zosyn  in the ED and transitioned to Keflex . Patient also required IV pain medication with improvement of symptoms. Discharge on home narcotics and cefadroxil . Patient to follow-up with her dermatologist on 4/19.

## 2022-03-30 NOTE — Assessment & Plan Note (Signed)
Resolved

## 2022-03-30 NOTE — Assessment & Plan Note (Signed)
Likely secondary to prednisone. ?

## 2022-03-30 NOTE — Discharge Summary (Signed)
?Physician Discharge Summary ?  ?Patient: Diana Sanchez MRN: [id number] DOB: [birth year]  ?Admit date:     03/29/2022  ?Discharge date: 03/30/22  ?Discharge Physician: Cordelia Poche, MD  ? ?PCP: Willene Hatchet, NP  ? ?Recommendations at discharge:  ? ?Outpatient PCP and dermatology follow-up ? ?Discharge Diagnoses: ?Principal Problem: ?  Chronic skin ulcer of lower leg ([place]) ?Active Problems: ?  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 ?  DNR (do not resuscitate)/DNI(Do Not Intubate) ?  Leukocytosis ? ?Resolved Problems: ?  Hyperkalemia ? ?Assessment and Plan: ?* Chronic skin ulcer of lower leg ([place]) ?Concern for possible acute infection. Patient was initially started on Vancomycin and Zosyn in the ED and transitioned to Keflex. Patient also required IV pain medication with improvement of symptoms. Discharge on home narcotics and cefadroxil. Patient to follow-up with her dermatologist on 4/19. ? ?Leukocytosis ?Likely secondary to prednisone. ? ?DNR (do not resuscitate)/DNI(Do Not Intubate) ?Verified this admission. ? 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 ?Continue with prednisone 60 mg daily. See problem, Chronic skin ulcer of lower leg . ? ?Hyperkalemia-resolved as of 03/30/2022 ?Resolved. ? ? ? ? ?  ? ? ?Consultants: None ?Procedures performed: None  ?Disposition: Home ?Diet recommendation: Regular diet ? ?DISCHARGE MEDICATION: ?Allergies as of 03/30/2022   ? ?   Reactions  ? Tramadol   ? nausea ?Other reaction(s): Unknown  ? ?  ? ?  ?Medication List  ?  ? ?TAKE these medications   ? ?Accu-Chek Guide w/Device Kit ?by Does not apply route. ?  ?albuterol 108 (90 Base) MCG/ACT inhaler ?Commonly known as: VENTOLIN HFA ?Inhale into the lungs. ?  ?Alcohol Wipes 70 % Pads ?by Does not apply route. ?  ?aspirin EC 81 MG tablet ?Take 81 mg by mouth daily. ?  ?cefadroxil 500 MG capsule ?Commonly known as: DURICEF ?Take 1 capsule (500 mg total) by mouth 2 (two) times daily for 6 days. ?   ?clobetasol ointment 0.05 % ?Commonly known as: TEMOVATE ?Apply 1 application. topically every other day. ?  ?[facility] Acetaminophen Ex St 500 MG tablet ?Generic drug: acetaminophen ?Take 500 mg by mouth 2 (two) times daily as needed for moderate pain. ?  ?cyclobenzaprine 10 MG tablet ?Commonly known as: FLEXERIL ?Take 10 mg by mouth 3 (three) times daily as needed for muscle spasms. ?  ?gabapentin 100 MG capsule ?Commonly known as: NEURONTIN ?Take 100 mg by mouth 2 (two) times daily. ?  ?Linzess 72 MCG capsule ?Generic drug: linaclotide ?Take 72 mcg by mouth daily before breakfast. ?  ?multivitamin tablet ?Take 1 tablet by mouth daily. ?  ?Narcan 4 MG/0.1ML Liqd nasal spray kit ?Generic drug: naloxone ?  ?oxyCODONE-acetaminophen 10-325 MG tablet ?Commonly known as: PERCOCET ?Take 1 tablet by mouth every 8 (eight) hours as needed. for pain ?  ?predniSONE 20 MG tablet ?Commonly known as: DELTASONE ?Take 60 mg by mouth daily. ?  ?rizatriptan 5 MG tablet ?Commonly known as: MAXALT ?Take 5 mg by mouth See admin instructions. TAKE TWO TABLETS (10 MG DOSE) BY MOUTH AS NEEDED FOR MIGRAINE. MAY REPEAT IN 2 HOURS IF NEEDED ?  ?rosuvastatin 20 MG tablet ?Commonly known as: CRESTOR ?Take 20 mg by mouth at bedtime. ?  ?silver sulfADIAZINE 1 % cream ?Commonly known as: SILVADENE ?Apply 1 application. topically every other day. ?  ?triamcinolone cream 0.1 % ?Commonly known as: KENALOG ?Apply 1 application. topically 2 (two) times daily as needed (for itching). ?  ?VITAMIN C PO ?Take  1 tablet by mouth daily. ?  ? ?  ? ?  ?  ? ? ?  ?Discharge Care Instructions  ?(From admission, onward)  ?  ? ? ?  ? ?  Start     Ordered  ? 03/30/22 0000  Discharge wound care:       ?Comments: Wound care to left medial malleolus (chronic, non healing), full thickness:  Cleanse with NS, pat gently dry.  Apply Silvadene cream to the wound in a 1/8 inch layer, top with saline moistened gauze, dry gauze, ABD pad and secure with Kerlix roll gauze and ABD.  Perform daily  ? 03/30/22 1352  ? ?  ?  ? ?  ? ? Follow-up Information   ? ? [facility] Follow up.   ?Why: [facility] will continue to provide nursing for wound care in the home. ? ?  ?  ? ? Willene Hatchet, NP. Schedule an appointment as soon as possible for a visit in 1 week(s).   ?Specialty: Nurse Practitioner ?Why: For hospital follow-up ?Contact information: ?[address] ?[place] [address] ?[phone] ? ? ?  ?  ? ?  ?  ? ?  ? ?Discharge Exam: ? ?BP 124/68 (BP Location: Left Arm)   Pulse 88   Temp 98 ?F (36.7 ?C) (Oral)   Resp 18   Ht 5' 11" (1.803 m)   Wt 87.8 kg   SpO2 100%   BMI 27.00 kg/m?  ? ?General exam: Appears calm and comfortable ?Respiratory system: Clear to auscultation. Respiratory effort normal. ?Cardiovascular system: S1 & S2 heard, RRR. ?Gastrointestinal system: Abdomen is nondistended, soft and nontender. Normal bowel sounds heard. ?Central nervous system: Alert and oriented. No focal neurological deficits. ?Musculoskeletal: No calf tenderness ?Skin: Left lower leg in clean dressing. Surrounding lower leg with no erythema but is slightly warm ?Psychiatry: Judgement and insight appear normal. Mood & affect appropriate.  ? ?Condition at discharge: stable ? ?The results of significant diagnostics from this hospitalization (including imaging, microbiology, ancillary and laboratory) are listed below for reference.  ? ?Imaging Studies: ?DG Tibia/Fibula Left ? ?Result Date: 03/30/2022 ?CLINICAL DATA:  Nonhealing distal foreleg wound, left foreleg. EXAM: LEFT TIBIA AND FIBULA - 2 VIEW COMPARISON:  None. FINDINGS: Generalized osteopenia. No fracture is evident. There is a revision total knee arthroplasty, with dystrophic calcifications along the medial aspect of the prosthetic joint space and no evidence of loosening in the visualized portion. There are numerous subcutaneous calcifications in the subcutaneous plane in the proximal, mid and distal foreleg  greatest in the mid foreleg and could relate to dystrophic calcifications such as due to prior infection, burn injury, trauma or dermatomyositis. There is a broad-based ulcer in the medial aspect of the distal foreleg measuring up to 8 cm across and up to 6 mm in depth. No underlying erosive bone lesion is seen. There is mild generalized edema in the distal foreleg and ankle. The ankle mortise appears symmetric. IMPRESSION: 1. Broad-based ulcer in the medial distal foreleg, without acute underlying bony abnormality. Osteopenia. 2. Partially visible revision total knee arthroplasty, visualized portion without evidence of loosening. 3. Soft tissue calcifications, most numerous in the mid to distal foreleg with differential diagnosis as above. 4. Mild edema in the distal foreleg and ankle. Electronically Signed   By: Telford Nab M.D.   On: 03/30/2022 01:03   ? ?Microbiology: ?Results for orders placed or performed during the hospital encounter of 12/08/21  ?Resp Panel by  RT-PCR (Flu A&B, Covid) Nasopharyngeal Swab     Status: None  ? Collection Time: 12/08/21  6:23 PM  ? Specimen: Nasopharyngeal Swab; Nasopharyngeal(NP) swabs in vial transport medium  ?Result Value Ref Range Status  ? SARS Coronavirus 2 by RT PCR NEGATIVE NEGATIVE Final  ?  Comment: (NOTE) ?SARS-CoV-2 target nucleic acids are NOT DETECTED. ? ?The SARS-CoV-2 RNA is generally detectable in upper respiratory ?specimens during the acute phase of infection. The lowest ?concentration of SARS-CoV-2 viral copies this assay can detect is ?138 copies/mL. A negative result does not preclude SARS-Cov-2 ?infection and should not be used as the sole basis for treatment or ?other patient management decisions. A negative result may occur with  ?improper specimen collection/handling, submission of specimen other ?than nasopharyngeal swab, presence of viral mutation(s) within the ?areas targeted by this assay, and inadequate number of viral ?copies(<138 copies/mL). A  negative result must be combined with ?clinical observations, patient history, and epidemiological ?information. The expected result is Negative. ? ?Fact Sheet for Patients:  ?https://waller.org/

## 2022-03-30 NOTE — Discharge Instructions (Signed)
Diana Sanchez, ? ?You were in the hospital because of your leg wound with pain. You might have an infection causing this. You have improved with antibiotics and pain medications. Please follow-up with your dermatologist on 4/19. ?

## 2022-03-30 NOTE — TOC Initial Note (Signed)
Transition of Care (TOC) - Initial/Assessment Note  ? ?Patient Details  ?Name: Diana Sanchez ?MRN: [id number] ?Date of Birth: 1955/12/31 ? ?Transition of Care (TOC) CM/SW Contact:    ?Ewing Schlein, LCSW ?Phone Number: ?03/30/2022, 11:26 AM ? ?Clinical Narrative: CSW spoke with patient and patient confirmed she is active with [facility] for [facility] to manage wound care at home. CSW followed up with Becky Sax with [facility] and was informed the agency will only need new HH orders if the patient changes to inpatient or needs addition [facility] services. TOC to follow. ? ?Expected Discharge Plan: Home w Home Health Services ?Barriers to Discharge: No Barriers Identified ? ?Patient Goals and CMS Choice ?Patient states their goals for this hospitalization and ongoing recovery are:: Return home with [facility] through [facility] ? ?Expected Discharge Plan and Services ?Expected Discharge Plan: Home w Home Health Services ?In-house Referral: Clinical Social Work ?Living arrangements for the past 2 months: Apartment           ?DME Arranged: N/A ?DME Agency: NA ? ?Prior Living Arrangements/Services ?Living arrangements for the past 2 months: Apartment ?Patient language and need for interpreter reviewed:: Yes ?Do you feel safe going back to the place where you live?: Yes      ?Need for Family Participation in Patient Care: No (Comment) ?Care giver support system in place?: Yes (comment) ?Criminal Activity/Legal Involvement Pertinent to Current Situation/Hospitalization: No - Comment as needed ? ?Activities of Daily Living ?Home Assistive Devices/Equipment: Eyeglasses, [place] Mor (specify quad or straight) ?ADL Screening (condition at time of admission) ?Patient's cognitive ability adequate to safely complete daily activities?: Yes ?Is the patient deaf or have difficulty hearing?: No ?Does the patient have difficulty seeing, even when wearing glasses/contacts?: No ?Does the patient have difficulty concentrating, remembering, or making  decisions?: No ?Patient able to express need for assistance with ADLs?: Yes ?Does the patient have difficulty dressing or bathing?: No ?Independently performs ADLs?: Yes (appropriate for developmental age) ?Does the patient have difficulty walking or climbing stairs?: Yes ?Weakness of Legs: Left ?Weakness of Arms/Hands: None ? ?Permission Sought/Granted ?Permission sought to share information with : Other (comment) ?Permission granted to share information with : Yes, Verbal Permission Granted ?Permission granted to share info w AGENCY: [facility] ? ?Emotional Assessment ?Attitude/Demeanor/Rapport: Engaged ?Affect (typically observed): Accepting ?Orientation: : Oriented to Self, Oriented to Place, Oriented to  Time, Oriented to Situation ?Alcohol / Substance Use: Not Applicable ? ?Admission diagnosis:  Wound infection [T14.8XXA, L08.9] ?Chronic skin ulcer of lower leg (HCC) [L97.909] ?Patient Active Problem List  ? Diagnosis Date Noted  ? Chronic skin ulcer of lower leg (HCC) 03/30/2022  ? 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 03/30/2022  ? DNR (do not resuscitate)/DNI(Do Not Intubate) 03/30/2022  ? Iron deficiency anemia 11/30/2021  ? Palpitations 08/27/2021  ? Constipation 05/09/2020  ? Intrinsic eczema 05/09/2020  ? Memory changes 05/09/2020  ? Hypercholesterolemia 03/23/2020  ? Prediabetes 03/23/2020  ? Lower abdominal pain 08/29/2019  ? Left leg swelling 08/27/2019  ? Abnormal mammogram 08/24/2019  ? Skin ulcer of left ankle with fat layer exposed (HCC) 07/03/2019  ? Venous insufficiency 02/20/2019  ? Venous stasis dermatitis of both lower extremities 02/20/2019  ? Venous stasis ulcer with varicose veins (HCC) 02/20/2019  ? Depression 01/26/2019  ? GERD (gastroesophageal reflux disease) 01/26/2019  ? Venous stasis ulcer of left lower leg with edema of left lower leg (HCC) 01/10/2019  ? Bilateral hearing loss 08/01/2018  ? 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  Personal history of MRSA (methicillin resistant Staphylococcus aureus) 06/13/2018  ? Migraine without status migrainosus, not intractable 04/13/2017  ? DDD (degenerative disc disease), lumbar 10/19/2016  ? Lumbar facet arthropathy 10/19/2016  ? Osteoarthritis of both knees 10/19/2016  ? Polyarthropathy, multiple sites 10/19/2016  ? Chronic pain syndrome 10/19/2016  ? Cataract of both eyes 07/30/2016  ? Anemia of chronic disease 09/04/2014  ? History of DVT (deep vein thrombosis) 08/12/2014  ? Wound cellulitis 08/12/2014  ? 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 DOE (dyspnea on exertion) 12/07/2013  ? Generalized weakness 12/06/2013  ? Hypokalemia 12/06/2013  ? SLE (systemic lupus erythematosus) (HCC) 11/13/2013  ? Asthma 02/14/2012  ? Rheumatoid arthritis (HCC) 12/17/2011  ? Polymyalgia rheumatica (HCC) 12/17/2011  ? ?PCP:  Estevan Oaks, NP ?Pharmacy:   ?[id number] - [place], [place] - [address] ?[address] ?[place] [address] ?Phone: [phone] Fax: [phone] ? ?Readmission Risk Interventions ?   ? View : No data to display.  ?  ?  ?  ? ?

## 2022-03-30 NOTE — Assessment & Plan Note (Addendum)
Concern for possible acute infection. Patient was initially started on Vancomycin and Zosyn in the ED and transitioned to Keflex. Patient also required IV pain medication with improvement of symptoms. Discharge on home narcotics and cefadroxil. Patient to follow-up with her dermatologist on 4/19. ?

## 2022-03-30 NOTE — Assessment & Plan Note (Addendum)
Verified this admission. ?

## 2022-03-30 NOTE — Assessment & Plan Note (Addendum)
Continue with prednisone 60 mg daily. See problem, Chronic skin ulcer of lower leg . ?

## 2022-03-30 NOTE — Plan of Care (Signed)
Discharge instructions given to the patient including medications, wound care and follow up appointments.  ?

## 2022-03-30 NOTE — Subjective & Objective (Signed)
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  She has been taking 60 mg of prednisone for several weeks now.  She had a biopsy performed by dermatology on March 03, 2022.  This diagnosed her with pyoderma gangrenosum.  She has been on steroids since then.  She states that she has not been taking any antibiotics recently.  She states the pain has been increasing in intensity.  She has been on Percocet 10/325 3-4 times a day but still having a lot of pain.  She came to the ER for pain relief. ? ? ?Patient lives in [place] and her PCP is with [facility] [facility] however all of her subspecialty care regarding her ulcer is in [place] (wound care clinic), [facility] (dermatology, vascular surgery). ? ?Patient denies any recent fever, chills.  She states that there has been increased amount of drainage from the ulcer. ? ?She had a vascular work-up including ABIs and toe pressures.  This was performed in January 2023.  This was performed at [facility].  She had toe pressures on the left side of 85 mmHg.  This was felt to be adequate for wound healing. ? ?[facility] hospitalist contacted for admission due to increased pain. ?

## 2022-03-30 NOTE — ED Provider Notes (Signed)
?[place] DEPT ?Provider Note ? ? ?CSN: [id number] ?Arrival date & time: 03/29/22  1301 ? ?  ? ?History ? ?Chief Complaint  ?Patient presents with  ? Wound Check  ? ? ?Diana Sanchez is a 66 y.o. female. ? ?The history is provided by the patient and medical records. The history is limited by the condition of the patient.  ?Wound Check ?This is a new problem. The current episode started more than 1 week ago. The problem occurs constantly. The problem has not changed since onset.Pertinent negatives include no chest pain, no abdominal pain, no headaches and no shortness of breath. Nothing aggravates the symptoms. Nothing relieves the symptoms. Treatments tried: steroids. The treatment provided no relief.  ?Patient with a wound on LLE, thought to be vascular and then biopsied and diagnosed as a pyoderma gangrenosum on high dose steroids and 10/325 percocet since the end of march without change.  No improvement in pain.  Does not see dermatology til Wednesday.  No f/c/r.   ?  ? ?Home Medications ?Prior to Admission medications   ?Medication Sig Start Date End Date Taking? Authorizing Provider  ?Ascorbic Acid (VITAMIN C PO) Take 1 tablet by mouth daily.   Yes [provider]  ?aspirin EC 81 MG tablet Take 81 mg by mouth daily.   Yes [provider]  ?clobetasol ointment (TEMOVATE) 4.46 % Apply 1 application. topically every other day. 03/09/22  Yes [provider]  ?[facility]S ACETAMINOPHEN EX ST 500 MG tablet Take 500 mg by mouth 2 (two) times daily as needed for moderate pain. 03/06/22  Yes [provider]  ?cyclobenzaprine (FLEXERIL) 10 MG tablet Take 10 mg by mouth 3 (three) times daily as needed for muscle spasms.   Yes [provider]  ?gabapentin (NEURONTIN) 100 MG capsule Take 100 mg by mouth 2 (two) times daily. 01/15/22  Yes [provider]  ?Rolan Lipa 72 MCG capsule Take 72 mcg by mouth daily before breakfast. 01/12/19  Yes  [provider]  ?Multiple Vitamin (MULTIVITAMIN) tablet Take 1 tablet by mouth daily.   Yes [provider]  ?silver sulfADIAZINE (SILVADENE) 1 % cream Apply 1 application. topically every other day. 03/09/22  Yes [provider]  ?albuterol (PROVENTIL HFA;VENTOLIN HFA) 108 (90 Base) MCG/ACT inhaler Inhale into the lungs. ?Patient not taking: Reported on 03/30/2022 02/14/12   [provider]  ?Alcohol Swabs (ALCOHOL WIPES) 70 % PADS by Does not apply route. 04/04/20   [provider]  ?Blood Glucose Monitoring Suppl (ACCU-CHEK GUIDE) w/Device KIT by Does not apply route. 04/04/20   [provider]  ?Karma Greaser 4 MG/0.1ML LIQD nasal spray kit  08/09/18   [provider]  ?rizatriptan (MAXALT) 5 MG tablet Take 5 mg by mouth See admin instructions. TAKE TWO TABLETS (10 MG DOSE) BY MOUTH AS NEEDED FOR MIGRAINE. MAY REPEAT IN 2 HOURS IF NEEDED    [provider]  ?rosuvastatin (CRESTOR) 20 MG tablet Take 20 mg by mouth daily. 10/07/21   [provider]  ?triamcinolone cream (KENALOG) 0.1 % Apply 1 application topically 2 (two) times daily as needed. 11/19/21   [provider]  ?   ? ?Allergies    ?Tramadol   ? ?Review of Systems   ?Review of Systems  ?Constitutional:  Negative for fever.  ?HENT:  Negative for congestion.   ?Eyes:  Negative for redness.  ?Respiratory:  Negative for shortness of breath.   ?Cardiovascular:  Negative for chest pain.  ?  Gastrointestinal:  Negative for abdominal pain.  ?Genitourinary:  Negative for difficulty urinating.  ?Musculoskeletal:  Positive for arthralgias. Negative for neck stiffness.  ?Skin:  Positive for wound.  ?Neurological:  Negative for headaches.  ?Psychiatric/Behavioral:  Negative for agitation.   ?All other systems reviewed and are negative. ? ?Physical Exam ?Updated Vital Signs ?BP (!) 146/87   Pulse 83   Temp 98.1 ?F (36.7 ?C) (Oral)   Resp 18   SpO2 100%  ?Physical Exam ?Vitals and nursing  note reviewed. Exam conducted with a chaperone present.  ?Constitutional:   ?   General: She is not in acute distress. ?   Appearance: Normal appearance.  ?HENT:  ?   Head: Normocephalic and atraumatic.  ?   Nose: Nose normal.  ?Eyes:  ?   Conjunctiva/sclera: Conjunctivae normal.  ?   Pupils: Pupils are equal, round, and reactive to light.  ?Cardiovascular:  ?   Rate and Rhythm: Normal rate and regular rhythm.  ?   Pulses: Normal pulses.  ?   Heart sounds: Normal heart sounds.  ?Pulmonary:  ?   Effort: Pulmonary effort is normal.  ?   Breath sounds: Normal breath sounds.  ?Abdominal:  ?   General: Bowel sounds are normal.  ?   Palpations: Abdomen is soft.  ?   Tenderness: There is no abdominal tenderness. There is no guarding.  ?Musculoskeletal:     ?   General: Normal range of motion.  ?   Cervical back: Normal range of motion and neck supple.  ?Skin: ?   General: Skin is warm and dry.  ?   Capillary Refill: Capillary refill takes less than 2 seconds.  ? ?    ?Neurological:  ?   General: No focal deficit present.  ?   Mental Status: She is alert and oriented to person, place, and time.  ?   Deep Tendon Reflexes: Reflexes normal.  ?Psychiatric:     ?   Mood and Affect: Mood normal.     ?   Behavior: Behavior normal.  ? ? ?ED Results / Procedures / Treatments   ?Labs ?(all labs ordered are listed, but only abnormal results are displayed) ?Results for orders placed or performed during the hospital encounter of 03/29/22  ?Basic metabolic panel  ?Result Value Ref Range  ? Sodium 139 135 - 145 mmol/L  ? Potassium 5.9 (H) 3.5 - 5.1 mmol/L  ? Chloride 106 98 - 111 mmol/L  ? CO2 27 22 - 32 mmol/L  ? Glucose, Bld 103 (H) 70 - 99 mg/dL  ? BUN 28 (H) 8 - 23 mg/dL  ? Creatinine, Ser 0.96 0.44 - 1.00 mg/dL  ? Calcium 9.0 8.9 - 10.3 mg/dL  ? GFR, Estimated >60 >60 mL/min  ? Anion gap 6 5 - 15  ?CBC with Differential  ?Result Value Ref Range  ? WBC 16.3 (H) 4.0 - 10.5 K/uL  ? RBC 3.76 (L) 3.87 - 5.11 MIL/uL  ? Hemoglobin 9.9 (L)  12.0 - 15.0 g/dL  ? HCT 32.4 (L) 36.0 - 46.0 %  ? MCV 86.2 80.0 - 100.0 fL  ? MCH 26.3 26.0 - 34.0 pg  ? MCHC 30.6 30.0 - 36.0 g/dL  ? RDW 20.6 (H) 11.5 - 15.5 %  ? Platelets 192 150 - 400 K/uL  ? nRBC 0.0 0.0 - 0.2 %  ? Neutrophils Relative % 88 %  ? Neutro Abs 14.4 (H) 1.7 - 7.7 K/uL  ? Lymphocytes Relative 8 %  ?  Lymphs Abs 1.2 0.7 - 4.0 K/uL  ? Monocytes Relative 3 %  ? Monocytes Absolute 0.5 0.1 - 1.0 K/uL  ? Eosinophils Relative 1 %  ? Eosinophils Absolute 0.1 0.0 - 0.5 K/uL  ? Basophils Relative 0 %  ? Basophils Absolute 0.0 0.0 - 0.1 K/uL  ? Immature Granulocytes 0 %  ? Abs Immature Granulocytes 0.06 0.00 - 0.07 K/uL  ? ?No results found. ? ? ?Radiology ?No results found. ? ?Procedures ?Procedures  ? ? ?Medications Ordered in ED ?Medications  ?Tdap (BOOSTRIX) injection 0.5 mL (0.5 mLs Intramuscular Given 03/30/22 0032)  ? ? ?ED Course/ Medical Decision Making/ A&P ?  ?                        ?Medical Decision Making ?Wound of the LLE on steroids  ? ?Amount and/or Complexity of Data Reviewed ?External Data Reviewed: notes. ?   Details: vascular notes and previous dermatology notes ?Labs: ordered. ?   Details: all labs reviewed by me:  elevated white count 16.3 with slight left shift , normal sodium and creatinine on chemstry, potassium repeated, appears to be hemolysis of sample. ?Radiology: ordered. ? ?Risk ?Prescription drug management. ?Decision regarding hospitalization. ? ? ? ?Final Clinical Impression(s) / ED Diagnoses ?Final diagnoses:  ?None  ?The patient appears reasonably stabilized for admission considering the current resources, flow, and capabilities available in the ED at this time, and I doubt any other [facility] requiring further screening and/or treatment in the ED prior to admission.  ? ?Rx / DC Orders ?ED Discharge Orders   ? ? None  ? ?  ? ? ?  ?Anddy Wingert, MD ?03/30/22 0229 ? ?

## 2022-03-30 NOTE — ED Notes (Signed)
Pt stated she wanted to be a DNR  ?

## 2022-04-04 LAB — CULTURE, BLOOD (ROUTINE X 2)
Culture: NO GROWTH
Culture: NO GROWTH
Special Requests: ADEQUATE
Special Requests: ADEQUATE

## 2022-04-23 ENCOUNTER — Other Ambulatory Visit: Payer: Self-pay | Admitting: Nurse Practitioner

## 2022-04-23 DIAGNOSIS — Z1231 Encounter for screening mammogram for malignant neoplasm of breast: Secondary | ICD-10-CM

## 2022-05-04 ENCOUNTER — Ambulatory Visit: Payer: Medicare Other | Admitting: Podiatry

## 2022-05-06 ENCOUNTER — Ambulatory Visit: Payer: Medicare HMO

## 2022-06-29 ENCOUNTER — Inpatient Hospital Stay: Admission: RE | Admit: 2022-06-29 | Payer: Medicare Other | Source: Ambulatory Visit

## 2022-06-29 ENCOUNTER — Ambulatory Visit
Admission: RE | Admit: 2022-06-29 | Discharge: 2022-06-29 | Disposition: A | Discharge status: Home / Self Care | Payer: Medicare Other | Source: Ambulatory Visit | Attending: Nurse Practitioner | Admitting: Nurse Practitioner

## 2022-06-29 DIAGNOSIS — Z1231 Encounter for screening mammogram for malignant neoplasm of breast: Secondary | ICD-10-CM

## 2022-07-01 ENCOUNTER — Emergency Department (HOSPITAL_BASED_OUTPATIENT_CLINIC_OR_DEPARTMENT_OTHER): Admit: 2022-07-01 | Discharge: 2022-07-01 | Disposition: A | Discharge status: Home / Self Care | Payer: Medicare Other

## 2022-07-01 ENCOUNTER — Emergency Department (HOSPITAL_COMMUNITY): Payer: Medicare Other

## 2022-07-01 ENCOUNTER — Encounter (HOSPITAL_COMMUNITY): Payer: Self-pay | Admitting: Emergency Medicine

## 2022-07-01 ENCOUNTER — Observation Stay (HOSPITAL_COMMUNITY)
Admission: EM | Admit: 2022-07-01 | Discharge: 2022-07-02 | Disposition: A | Discharge status: Home / Self Care | Payer: Medicare Other | Attending: Student | Admitting: Student

## 2022-07-01 DIAGNOSIS — J45909 Unspecified asthma, uncomplicated: Secondary | ICD-10-CM | POA: Diagnosis not present

## 2022-07-01 DIAGNOSIS — E7800 Pure hypercholesterolemia, unspecified: Secondary | ICD-10-CM | POA: Diagnosis not present

## 2022-07-01 DIAGNOSIS — L03116 Cellulitis of left lower limb: Principal | ICD-10-CM | POA: Diagnosis present

## 2022-07-01 DIAGNOSIS — L97909 Non-pressure chronic ulcer of unspecified part of unspecified lower leg with unspecified severity: Secondary | ICD-10-CM | POA: Diagnosis present

## 2022-07-01 DIAGNOSIS — I10 Essential (primary) hypertension: Secondary | ICD-10-CM | POA: Diagnosis not present

## 2022-07-01 DIAGNOSIS — L88 Pyoderma gangrenosum: Secondary | ICD-10-CM | POA: Diagnosis present

## 2022-07-01 DIAGNOSIS — Z86718 Personal history of other venous thrombosis and embolism: Secondary | ICD-10-CM | POA: Diagnosis not present

## 2022-07-01 DIAGNOSIS — Z96659 Presence of unspecified artificial knee joint: Secondary | ICD-10-CM | POA: Diagnosis not present

## 2022-07-01 DIAGNOSIS — Z79899 Other long term (current) drug therapy: Secondary | ICD-10-CM | POA: Insufficient documentation

## 2022-07-01 DIAGNOSIS — R52 Pain, unspecified: Secondary | ICD-10-CM | POA: Diagnosis not present

## 2022-07-01 DIAGNOSIS — Z7982 Long term (current) use of aspirin: Secondary | ICD-10-CM | POA: Diagnosis not present

## 2022-07-01 DIAGNOSIS — E119 Type 2 diabetes mellitus without complications: Secondary | ICD-10-CM

## 2022-07-01 DIAGNOSIS — I83892 Varicose veins of left lower extremities with other complications: Secondary | ICD-10-CM | POA: Diagnosis present

## 2022-07-01 DIAGNOSIS — M79672 Pain in left foot: Secondary | ICD-10-CM | POA: Diagnosis present

## 2022-07-01 LAB — CBC WITH DIFFERENTIAL/PLATELET
Abs Immature Granulocytes: 0.21 10*3/uL — ABNORMAL HIGH (ref 0.00–0.07)
Basophils Absolute: 0 10*3/uL (ref 0.0–0.1)
Basophils Relative: 0 %
Eosinophils Absolute: 0.1 10*3/uL (ref 0.0–0.5)
Eosinophils Relative: 1 %
HCT: 40.7 % (ref 36.0–46.0)
Hemoglobin: 12.4 g/dL (ref 12.0–15.0)
Immature Granulocytes: 2 %
Lymphocytes Relative: 21 %
Lymphs Abs: 2.9 10*3/uL (ref 0.7–4.0)
MCH: 28.4 pg (ref 26.0–34.0)
MCHC: 30.5 g/dL (ref 30.0–36.0)
MCV: 93.1 fL (ref 80.0–100.0)
Monocytes Absolute: 1.1 10*3/uL — ABNORMAL HIGH (ref 0.1–1.0)
Monocytes Relative: 8 %
Neutro Abs: 9.5 10*3/uL — ABNORMAL HIGH (ref 1.7–7.7)
Neutrophils Relative %: 68 %
Platelets: 254 10*3/uL (ref 150–400)
RBC: 4.37 MIL/uL (ref 3.87–5.11)
RDW: 16.8 % — ABNORMAL HIGH (ref 11.5–15.5)
WBC: 13.8 10*3/uL — ABNORMAL HIGH (ref 4.0–10.5)
nRBC: 0 % (ref 0.0–0.2)

## 2022-07-01 LAB — COMPREHENSIVE METABOLIC PANEL
ALT: 23 U/L (ref 0–44)
AST: 22 U/L (ref 15–41)
Albumin: 3.6 g/dL (ref 3.5–5.0)
Alkaline Phosphatase: 53 U/L (ref 38–126)
Anion gap: 12 (ref 5–15)
BUN: 27 mg/dL — ABNORMAL HIGH (ref 8–23)
CO2: 25 mmol/L (ref 22–32)
Calcium: 9.1 mg/dL (ref 8.9–10.3)
Chloride: 102 mmol/L (ref 98–111)
Creatinine, Ser: 0.74 mg/dL (ref 0.44–1.00)
GFR, Estimated: >60 mL/min (ref >60)
Glucose, Bld: 81 mg/dL (ref 70–99)
Potassium: 4.1 mmol/L (ref 3.5–5.1)
Sodium: 139 mmol/L (ref 135–145)
Total Bilirubin: 0.5 mg/dL (ref 0.3–1.2)
Total Protein: 7.4 g/dL (ref 6.5–8.1)

## 2022-07-01 MED ORDER — CEFAZOLIN SODIUM-DEXTROSE 1-4 GM/50ML-% IV SOLN
1.0000 g | Freq: Three times a day (TID) | INTRAVENOUS | Status: DC
  Administered 2022-07-02: 1 g via INTRAVENOUS
  Filled 2022-07-01: qty 50

## 2022-07-01 MED ORDER — ACETAMINOPHEN 650 MG RE SUPP: 650.0000 mg | Freq: Four times a day (QID) | RECTAL | Status: DC | PRN

## 2022-07-01 MED ORDER — LINACLOTIDE 72 MCG PO CAPS
72.0000 ug | ORAL_CAPSULE | Freq: Every day | ORAL | Status: DC
  Administered 2022-07-02: 72 ug via ORAL
  Filled 2022-07-01: qty 1

## 2022-07-01 MED ORDER — ROSUVASTATIN CALCIUM 20 MG PO TABS
20.0000 mg | ORAL_TABLET | Freq: Every day | ORAL | Status: DC
  Administered 2022-07-01: 20 mg via ORAL
  Filled 2022-07-01: qty 1

## 2022-07-01 MED ORDER — MORPHINE SULFATE (PF) 2 MG/ML IV SOLN
1.0000 mg | INTRAVENOUS | Status: DC | PRN
  Administered 2022-07-02: 1 mg via INTRAVENOUS
  Filled 2022-07-01: qty 1

## 2022-07-01 MED ORDER — ONDANSETRON HCL 4 MG PO TABS: 4.0000 mg | ORAL_TABLET | Freq: Four times a day (QID) | ORAL | Status: DC | PRN

## 2022-07-01 MED ORDER — PANTOPRAZOLE SODIUM 40 MG PO TBEC
40.0000 mg | DELAYED_RELEASE_TABLET | Freq: Every day | ORAL | Status: DC
  Administered 2022-07-01 – 2022-07-02 (×2): 40 mg via ORAL
  Filled 2022-07-01 (×2): qty 1

## 2022-07-01 MED ORDER — ALBUTEROL SULFATE (2.5 MG/3ML) 0.083% IN NEBU: 2.5000 mg | INHALATION_SOLUTION | Freq: Four times a day (QID) | RESPIRATORY_TRACT | Status: DC | PRN

## 2022-07-01 MED ORDER — HYDROCODONE-ACETAMINOPHEN 5-325 MG PO TABS
1.0000 | ORAL_TABLET | ORAL | Status: DC | PRN
  Administered 2022-07-01 – 2022-07-02 (×2): 2 via ORAL
  Filled 2022-07-01 (×2): qty 2

## 2022-07-01 MED ORDER — GABAPENTIN 100 MG PO CAPS
100.0000 mg | ORAL_CAPSULE | Freq: Two times a day (BID) | ORAL | Status: DC
  Administered 2022-07-01 – 2022-07-02 (×2): 100 mg via ORAL
  Filled 2022-07-01 (×2): qty 1

## 2022-07-01 MED ORDER — SODIUM CHLORIDE 0.9 % IV SOLN: 2.0000 g | Freq: Once | INTRAVENOUS | Status: DC

## 2022-07-01 MED ORDER — PREDNISONE 20 MG PO TABS
40.0000 mg | ORAL_TABLET | Freq: Every day | ORAL | Status: DC
  Administered 2022-07-02: 40 mg via ORAL
  Filled 2022-07-01: qty 2

## 2022-07-01 MED ORDER — ACETAMINOPHEN 325 MG PO TABS: 650.0000 mg | ORAL_TABLET | Freq: Four times a day (QID) | ORAL | Status: DC | PRN

## 2022-07-01 MED ORDER — ONDANSETRON HCL 4 MG/2ML IJ SOLN
4.0000 mg | Freq: Four times a day (QID) | INTRAMUSCULAR | Status: DC | PRN
  Administered 2022-07-02: 4 mg via INTRAVENOUS
  Filled 2022-07-01: qty 2

## 2022-07-01 MED ORDER — SODIUM CHLORIDE 0.9 % IV SOLN
2.0000 g | Freq: Once | INTRAVENOUS | Status: AC
  Administered 2022-07-01: 2 g via INTRAVENOUS
  Filled 2022-07-01: qty 20

## 2022-07-01 MED ORDER — MYCOPHENOLATE MOFETIL 250 MG PO CAPS
1000.0000 mg | ORAL_CAPSULE | Freq: Two times a day (BID) | ORAL | Status: DC
  Administered 2022-07-01 – 2022-07-02 (×2): 1000 mg via ORAL
  Filled 2022-07-01 (×2): qty 4

## 2022-07-01 MED ORDER — INSULIN ASPART 100 UNIT/ML IJ SOLN
0.0000 [IU] | Freq: Three times a day (TID) | INTRAMUSCULAR | Status: DC
  Filled 2022-07-01: qty 0.09

## 2022-07-01 MED ORDER — SENNOSIDES-DOCUSATE SODIUM 8.6-50 MG PO TABS: 1.0000 | ORAL_TABLET | Freq: Every evening | ORAL | Status: DC | PRN

## 2022-07-01 MED ORDER — ENOXAPARIN SODIUM 40 MG/0.4ML IJ SOSY
40.0000 mg | PREFILLED_SYRINGE | INTRAMUSCULAR | Status: DC
Start: 2022-07-02 — End: 2022-07-02
  Administered 2022-07-02: 40 mg via SUBCUTANEOUS
  Filled 2022-07-01: qty 0.4

## 2022-07-01 MED ORDER — HYDROCODONE-ACETAMINOPHEN 5-325 MG PO TABS
2.0000 | ORAL_TABLET | Freq: Once | ORAL | Status: AC
  Administered 2022-07-01: 2 via ORAL
  Filled 2022-07-01: qty 2

## 2022-07-01 MED ORDER — ALBUTEROL SULFATE HFA 108 (90 BASE) MCG/ACT IN AERS: 1.0000 | INHALATION_SPRAY | Freq: Four times a day (QID) | RESPIRATORY_TRACT | Status: DC | PRN

## 2022-07-01 NOTE — Hospital Course (Signed)
Diana Sanchez is a 66 y.o. 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

## 2022-07-01 NOTE — Assessment & Plan Note (Signed)
>>  ASSESSMENT AND PLAN FOR CHRONIC SKIN ULCER OF LOWER LEG (HCC) WRITTEN ON 07/01/2022 11:20 PM BY PATEL, VISHAL R, MD  Pyoderma gangrenosa Chronic wound/ulcer left lower leg due to venous insufficiency with history of biopsy-proven pyoderma gangrenosum.  Following with [facility] health dermatology and [facility] wound care. -Continue home CellCept  1000 mg twice daily -Continue home prednisone  40 mg daily -Analgesics as needed -Follow-up with dermatology and wound care as an outpatient

## 2022-07-01 NOTE — Progress Notes (Signed)
Left lower extremity venous duplex has been completed. Preliminary results can be found in CV Proc through chart review.  Results were given to Sherian Maroon PA.  07/01/22 1:37 PM Olen Cordial RVT

## 2022-07-01 NOTE — ED Provider Notes (Signed)
[place] DEPT Provider Note   CSN: [id number] Arrival date & time: 07/01/22  1249     History  Chief Complaint  Patient presents with   Foot Pain    [name] is a 66 y.o. female.  Patient has an ulcer to her left foot that has been taking care of for quite some while and now she swelling to her left foot and tenderness for a few days.  Patient has a history of diabetes  The history is provided by the patient and medical records. No language interpreter was used.  Foot Pain This is a new problem. The current episode started more than 2 days ago. The problem occurs constantly. The problem has not changed since onset.Pertinent negatives include no chest pain, no abdominal pain and no headaches. Nothing aggravates the symptoms. Nothing relieves the symptoms. She has tried nothing for the symptoms.       Home Medications Prior to Admission medications   Medication Sig Start Date End Date Taking? Authorizing Provider  albuterol (PROVENTIL HFA;VENTOLIN HFA) 108 (90 Base) MCG/ACT inhaler Inhale 1-2 puffs into the lungs every 6 (six) hours as needed for wheezing or shortness of breath. 02/14/12  Yes [provider]  Ascorbic Acid (VITAMIN C PO) Take 1 tablet by mouth daily.   Yes [provider]  aspirin EC 81 MG tablet Take 81 mg by mouth daily.   Yes [provider]  clobetasol ointment (TEMOVATE) 4.26 % Apply 1 application. topically every other day. 03/09/22  Yes [provider]  [employer] ACETAMINOPHEN EX ST 500 MG tablet Take 500 mg by mouth 2 (two) times daily as needed for moderate pain. 03/06/22  Yes [provider]  cyclobenzaprine (FLEXERIL) 10 MG tablet Take 10 mg by mouth 3 (three) times daily as needed for muscle spasms.   Yes [provider]  gabapentin (NEURONTIN) 100 MG capsule Take 100 mg by mouth 2 (two) times daily. 01/15/22  Yes [provider]  LINZESS 72 MCG capsule  Take 72 mcg by mouth daily before breakfast. 01/12/19  Yes [provider]  metFORMIN (GLUCOPHAGE) 500 MG tablet Take 500 mg by mouth 2 (two) times daily. 06/17/22  Yes [provider]  Multiple Vitamin (MULTIVITAMIN) tablet Take 1 tablet by mouth daily.   Yes [provider]  mycophenolate (CELLCEPT) 500 MG tablet Take 1,000 mg by mouth 2 (two) times daily. 06/10/22  Yes [provider]  oxyCODONE-acetaminophen (PERCOCET) 10-325 MG tablet Take 1 tablet by mouth every 8 (eight) hours as needed. for pain 03/16/22  Yes [provider]  predniSONE (DELTASONE) 20 MG tablet Take 40 mg by mouth daily. 03/09/22  Yes [provider]  rizatriptan (MAXALT) 5 MG tablet Take 10 mg by mouth See admin instructions. TAKE TWO TABLETS (10 MG DOSE) BY MOUTH AS NEEDED FOR MIGRAINE. MAY REPEAT IN 2 HOURS IF NEEDED   Yes [provider]  rosuvastatin (CRESTOR) 20 MG tablet Take 20 mg by mouth at bedtime. 10/07/21  Yes [provider]  triamcinolone cream (KENALOG) 0.1 % Apply 1 application. topically 2 (two) times daily as needed (for itching). 11/19/21  Yes [provider]  Alcohol Swabs (ALCOHOL WIPES) 70 % PADS by Does not apply route. 04/04/20   [provider]  Blood Glucose Monitoring Suppl (ACCU-CHEK GUIDE) w/Device KIT by Does not apply route. 04/04/20   [provider]  NARCAN 4 MG/0.1ML LIQD nasal spray kit  08/09/18   [provider]  silver sulfADIAZINE (SILVADENE) 1 % cream Apply 1 application. topically every other day. Patient not taking: Reported on 07/01/2022 03/09/22   [provider]      Allergies    Tramadol    Review of Systems   Review of Systems  Constitutional:  Negative for appetite change and fatigue.  HENT:  Negative for congestion, ear discharge and sinus pressure.   Eyes:  Negative for discharge.  Respiratory:  Negative for cough.   Cardiovascular:  Negative for chest pain.   Gastrointestinal:  Negative for abdominal pain and diarrhea.  Genitourinary:  Negative for frequency and hematuria.  Musculoskeletal:  Negative for back pain.       Ulcer to left foot  Skin:  Negative for rash.  Neurological:  Negative for seizures and headaches.  Psychiatric/Behavioral:  Negative for hallucinations.     Physical Exam Updated Vital Signs BP (!) 148/84   Pulse 96   Temp 98.1 F (36.7 C) (Oral)   Resp 18   SpO2 98%  Physical Exam Vitals and nursing note reviewed.  Constitutional:      Appearance: She is well-developed.  HENT:     Head: Normocephalic.     Nose: Nose normal.  Eyes:     General: No scleral icterus.    Conjunctiva/sclera: Conjunctivae normal.  Neck:     Thyroid: No thyromegaly.  Cardiovascular:     Rate and Rhythm: Normal rate and regular rhythm.     Heart sounds: No murmur heard.    No friction rub. No gallop.  Pulmonary:     Breath sounds: No stridor. No wheezing or rales.  Chest:     Chest wall: No tenderness.  Abdominal:     General: There is no distension.     Tenderness: There is no abdominal tenderness. There is no rebound.  Musculoskeletal:        General: Normal range of motion.     Cervical back: Neck supple.     Comments: Swelling to left foot with healing ulcer  Lymphadenopathy:     Cervical: No cervical adenopathy.  Skin:    Findings: No erythema or rash.  Neurological:     Mental Status: She is alert and oriented to person, place, and time.     Motor: No abnormal muscle tone.     Coordination: Coordination normal.  Psychiatric:        Behavior: Behavior normal.     ED Results / Procedures / Treatments   Labs (all labs ordered are listed, but only abnormal results are displayed) Labs Reviewed  COMPREHENSIVE METABOLIC PANEL - Abnormal; Notable for the following components:      Result Value   BUN 27 (*)    All other components within normal limits  CBC WITH DIFFERENTIAL/PLATELET - Abnormal; Notable for the  following components:   WBC 13.8 (*)    RDW 16.8 (*)    Neutro Abs 9.5 (*)    Monocytes Absolute 1.1 (*)    Abs Immature Granulocytes 0.21 (*)    All other components within normal limits    EKG None  Radiology DG Foot Complete Left  Result Date: 07/01/2022 CLINICAL DATA:  Left foot pain EXAM: LEFT FOOT - COMPLETE 3+ VIEW COMPARISON:  None Available. FINDINGS: Frontal, oblique, and lateral views of the left foot are obtained. There is diffuse soft tissue swelling. No subcutaneous gas or radiopaque foreign body. There are no acute or destructive bony abnormalities. Multifocal osteoarthritis greatest at the first metatarsophalangeal joint  and throughout the midfoot. IMPRESSION: 1. Diffuse soft tissue swelling. 2. No acute or destructive bony lesions. 3. Multifocal osteoarthritis as above. Electronically Signed   By: Randa Ngo M.D.   On: 07/01/2022 20:36   VAS [place] LOWER EXTREMITY VENOUS (DVT) (7a-7p)  Result Date: 07/01/2022  Lower Venous DVT Study Patient Name:  Diana Sanchez [facility]  Date of Exam:   07/01/2022 Medical Rec #: [id number]                   Accession #:    [id number] Date of Birth: 01-Dec-1956                   Patient Gender: F Patient Age:   34 years Exam Location:  [facility] Procedure:      VAS [place] LOWER EXTREMITY VENOUS (DVT) Referring Phys: COOPER ROBBINS --------------------------------------------------------------------------------  Indications: Pain.  Risk Factors: None identified. Limitations: Poor ultrasound/tissue interface, bandages, open wound and patient positioning, patient movement, patient pain tolerance. Comparison Study: No prior studies. Performing Technologist: Oliver Hum RVT  Examination Guidelines: A complete evaluation includes B-mode imaging, spectral Doppler, color Doppler, and power Doppler as needed of all accessible portions of each vessel. Bilateral testing is considered an integral part of a complete examination. Limited examinations  for reoccurring indications may be performed as noted. The reflux portion of the exam is performed with the patient in reverse Trendelenburg.  +-----+---------------+---------+-----------+----------+--------------+ RIGHTCompressibilityPhasicitySpontaneityPropertiesThrombus Aging +-----+---------------+---------+-----------+----------+--------------+ CFV  Full           Yes      Yes                                 +-----+---------------+---------+-----------+----------+--------------+   +---------+---------------+---------+-----------+----------+-------------------+ LEFT     CompressibilityPhasicitySpontaneityPropertiesThrombus Aging      +---------+---------------+---------+-----------+----------+-------------------+ CFV      Full           Yes      Yes                                      +---------+---------------+---------+-----------+----------+-------------------+ SFJ      Full                                                             +---------+---------------+---------+-----------+----------+-------------------+ FV Prox  Full                                                             +---------+---------------+---------+-----------+----------+-------------------+ FV Mid                  Yes      Yes                                      +---------+---------------+---------+-----------+----------+-------------------+ FV Distal               Yes      Yes                                      +---------+---------------+---------+-----------+----------+-------------------+  PFV      Full                                                             +---------+---------------+---------+-----------+----------+-------------------+ POP      Full           Yes      Yes                                      +---------+---------------+---------+-----------+----------+-------------------+ PTV      Full                                                              +---------+---------------+---------+-----------+----------+-------------------+ PERO                                                  Not well visualized +---------+---------------+---------+-----------+----------+-------------------+     Summary: RIGHT: - No evidence of common femoral vein obstruction.  LEFT: - There is no evidence of deep vein thrombosis in the lower extremity. However, portions of this examination were limited- see technologist comments above.  - No cystic structure found in the popliteal fossa.  *See table(s) above for measurements and observations. Electronically signed by Jamelle Haring on 07/01/2022 at 8:23:46 PM.    Final     Procedures Procedures    Medications Ordered in ED Medications  cefTRIAXone (ROCEPHIN) 2 g in sodium chloride 0.9 % 100 mL IVPB (2 g Intravenous Not Given 07/01/22 2015)  HYDROcodone-acetaminophen (NORCO/VICODIN) 5-325 MG per tablet 2 tablet (2 tablets Oral Given 07/01/22 1941)  cefTRIAXone (ROCEPHIN) 2 g in sodium chloride 0.9 % 100 mL IVPB (0 g Intravenous Stopped 07/01/22 2121)    ED Course/ Medical Decision Making/ A&P                           Medical Decision Making Amount and/or Complexity of Data Reviewed Radiology: ordered.  Risk Decision regarding hospitalization.  This patient presents to the ED for concern of left foot, this involves an extensive number of treatment options, and is a complaint that carries with it a high risk of complications and morbidity.  The differential diagnosis includes cellulitis   Co morbidities that complicate the patient evaluation  Diabetes   Additional history obtained:  Additional history obtained from patient External records from outside source obtained and reviewed including hospital records   Lab Tests:  I Ordered, and personally interpreted labs.  The pertinent results include: White count 15.7 chemistries unremarkable   Imaging Studies ordered:  I ordered imaging  studies including left foot shows some swelling with osteoarthritis I independently visualized and interpreted imaging which showed osteoarthritis I agree with the radiologist interpretation   Cardiac Monitoring: / EKG:  The patient was maintained on a cardiac monitor.  I personally viewed and interpreted the cardiac monitored which showed an  underlying rhythm of: Normal sinus rhythm   Consultations Obtained:  I requested consultation with the hospital,  and discussed lab and imaging findings as well as pertinent plan - they recommend: Admit   Problem List / ED Course / Critical interventions / Medication management  Diabetes and osteoarthritis I ordered medication including Rocephin Reevaluation of the patient after these medicines showed that the patient stayed the same I have reviewed the patients home medicines and have made adjustments as needed   Social Determinants of Health:  None   Test / Admission - Considered:  MRI of foot considered  Patient with cellulitis left foot.  She will be admitted to medicine        Final Clinical Impression(s) / ED Diagnoses Final diagnoses:  Cellulitis of left foot    Rx / DC Orders ED Discharge Orders     None         Milton Ferguson, MD 07/04/22 [phone]

## 2022-07-01 NOTE — ED Provider Triage Note (Signed)
Emergency Medicine Provider Triage Evaluation Note  Diana Sanchez , a 66 y.o. female  was evaluated in triage.  Pt complains of left lower extremity pain, swelling, redness.  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  She began to have a ulcerated lesion on the inside of her left lower leg.  She has been seen at [facility] for wound care but is ran into insurance complications and is able to afford certain therapies they are recommending.  She presents emergency department for persistent pain as well as increased swelling.  She states she is not currently on antibiotics and has been caring for the wound herself at home.  She notes history of DVT and states she is not currently taking anticoagulation.  Denies shortness of breath, chest pain, fever..  Review of Systems  Positive: See above Negative:   Physical Exam  BP (!) 174/84   Pulse (!) 109   Temp 97.6 F (36.4 C) (Oral)   Resp 18   SpO2 100%  Gen:   Awake, no distress   Resp:  Normal effort  MSK:   Moves extremities without difficulty  Other:  Large ulcerated lesion noted on the inner aspect of patient's left lower leg.  Surrounding erythema and swelling.  Patient has tender to palpation of left calf.  She is sits very uncomfortably and is in "excruciating pain"  Medical Decision Making  Medically screening exam initiated at 1:13 PM.  Appropriate orders placed.  Klani Tamrah Victorino was informed that the remainder of the evaluation will be completed by another provider, this initial triage assessment does not replace that evaluation, and the importance of remaining in the ED until their evaluation is complete.     Peter Garter, [place] 07/01/22 1315

## 2022-07-01 NOTE — H&P (Signed)
History and Physical    Diana Sanchez DOB: 1956/05/04 DOA: 07/01/2022  PCP: Willene Hatchet, NP  Patient coming from: PCP office  I have personally briefly reviewed patient's old medical records in [place]  Chief Complaint: Left foot pain  HPI: Diana Sanchez is a 66 y.o. 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  Patient presented to the ED with new erythema and pain of the left foot.  Has chronic ulcer to medial left lower leg with biopsy-proven pyoderma gangrenosum.  Following with [facility] health dermatology and on current treatment with CellCept and prednisone 40 mg daily.  Also following with wound care.  Ulcerated wound not significantly changed from baseline.  Denies any subjective fevers, chills, diaphoresis, nausea, vomiting.  ED Course  Labs/Imaging on admission: I have personally reviewed following labs and imaging studies.  Initial vitals showed BP 174/84, pulse 109, RR 18, temp 97.6 F, SPO2 100% on room air.  Labs show WBC 13.8, hemoglobin 12.4, platelets 254,000, sodium 139, potassium 4.1, bicarb 25, BUN 27, creatinine 0.74, serum glucose 81, LFTs within normal limits.  Left foot x-ray shows diffuse soft tissue swelling without acute or destructive bony lesions.  Multifocal osteoarthritis noted.  Patient was given IV ceftriaxone and Norco.  The hospitalist service was consulted to admit for further evaluation and management.  Review of Systems: All systems reviewed and are negative except as documented in history of present illness above.   Past Medical History:  Diagnosis Date   Arthritis    DVT of lower limb, acute (HCC)    FH: total knee replacement    H/O fracture of hip 03/21/2020   Hypercholesteremia    Hypertension    Infection    in  knee   Migraines     Past Surgical History:  Procedure Laterality Date   CESAREAN SECTION     HIP SURGERY     TOTAL KNEE ARTHROPLASTY      Social History:  reports that she has never smoked. She has never used smokeless tobacco. She reports that she does not drink alcohol. No history on file for drug use.  Allergies  Allergen Reactions   Tramadol     nausea Other reaction(s): Unknown    Family History  Problem Relation Age of Onset   Breast cancer Neg Hx      Prior to Admission medications   Medication Sig Start Date End Date Taking? Authorizing Provider  albuterol (PROVENTIL HFA;VENTOLIN HFA) 108 (90 Base) MCG/ACT inhaler Inhale into the lungs. Patient not taking: Reported on 03/30/2022 02/14/12   [provider]  Alcohol Swabs (ALCOHOL WIPES) 70 % PADS by Does not apply route. 04/04/20   [provider]  Ascorbic Acid (VITAMIN C PO) Take 1 tablet by mouth daily.    [provider]  aspirin EC 81 MG tablet Take 81 mg by mouth daily.    [provider]  Blood Glucose Monitoring Suppl (ACCU-CHEK GUIDE) w/Device KIT by Does not apply route. 04/04/20   [provider]  clobetasol ointment (TEMOVATE) 4.81 % Apply 1 application. topically every other day. 03/09/22   [provider]  [employer]S ACETAMINOPHEN EX ST 500 MG tablet Take 500 mg by mouth 2 (two) times daily as needed for moderate pain. 03/06/22   [provider]  cyclobenzaprine (  FLEXERIL) 10 MG tablet Take 10 mg by mouth 3 (three) times daily as needed for muscle spasms.    [provider]  gabapentin (NEURONTIN) 100 MG capsule Take 100 mg by mouth 2 (two) times daily. 01/15/22   [provider]  LINZESS 72 MCG capsule Take 72 mcg by mouth daily before breakfast. 01/12/19   [provider]  Multiple Vitamin (MULTIVITAMIN) tablet Take 1 tablet by mouth daily.    [provider]  NARCAN 4 MG/0.1ML LIQD nasal spray kit  08/09/18   [provider]  oxyCODONE-acetaminophen (PERCOCET) 10-325 MG tablet Take 1 tablet by mouth every 8 (eight) hours as needed. for pain 03/16/22   [provider]  predniSONE (DELTASONE) 20 MG tablet Take 60 mg by mouth daily. 03/09/22   [provider]  rizatriptan (MAXALT) 5 MG tablet Take 5 mg by mouth See admin instructions. TAKE TWO TABLETS (10 MG DOSE) BY MOUTH AS NEEDED FOR MIGRAINE. MAY REPEAT IN 2 HOURS IF NEEDED    [provider]  rosuvastatin (CRESTOR) 20 MG tablet Take 20 mg by mouth at bedtime. 10/07/21   [provider]  silver sulfADIAZINE (SILVADENE) 1 % cream Apply 1 application. topically every other day. 03/09/22   [provider]  triamcinolone cream (KENALOG) 0.1 % Apply 1 application. topically 2 (two) times daily as needed (for itching). 11/19/21   [provider]    Physical Exam: Vitals:   07/01/22 2045 07/01/22 2100 07/01/22 2130 07/01/22 2200  BP:  (!) 148/84  (!) 160/85  Pulse: 96 100 96 94  Resp:  18  18  Temp:      TempSrc:      SpO2: 96% 100% 98% 100%   Constitutional: Resting supine in bed, NAD, calm, comfortable Eyes: EOMI, lids and conjunctivae normal ENMT: Mucous membranes are moist. Posterior pharynx clear of any exudate or lesions.Normal dentition.  Neck: normal, supple, no masses. Respiratory: clear to auscultation bilaterally, no wheezing, no crackles. Normal respiratory effort. No accessory muscle use.  Cardiovascular: Regular rate and rhythm, no murmurs / rubs / gallops. No extremity edema. 2+ pedal pulses. Abdomen: no tenderness, no masses palpated. No hepatosplenomegaly. Bowel sounds positive.  Musculoskeletal: no clubbing / cyanosis. No joint deformity upper and lower extremities. Good ROM, no contractures. Normal muscle tone.  Skin: Ulcerated wound medial left lower leg as pictured below with granulation and fibrinopurulent tissue.  Erythema to left foot. Neurologic: Sensation intact. Strength 5/5  in all 4.  Psychiatric: Alert and oriented x 3. Normal mood.     EKG: Not performed.  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   Type 2 diabetes mellitus ([place])   Asthma   Hypercholesterolemia   Diana Sanchez is a 66 y.o. 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  Assessment and Plan: * Cellulitis of left foot New erythema and soft tissue swelling of the left foot with associated leukocytosis. -Continue IV Ancef  Chronic skin ulcer of lower leg (HCC) Pyoderma gangrenosa Chronic wound/ulcer left lower leg due to venous insufficiency with history of biopsy-proven pyoderma gangrenosum.  Following with [facility] health dermatology and [facility] wound care. -Continue home CellCept 1000 mg twice daily -Continue home prednisone 40 mg daily -Analgesics  as needed -Follow-up with dermatology and wound care as an outpatient  Type 2 diabetes mellitus (HCC) A1c 6.8%.  Hold home metformin.  Place on sensitive SSI.  Hypercholesterolemia Continue rosuvastatin.  Asthma Stable, no wheezing on admission.  Continue albuterol as needed.  DVT prophylaxis: enoxaparin (LOVENOX) injection 40 mg Start: 07/02/22 1000 Code Status: DNR, confirmed with patient on admission Family Communication: Discussed with patient, she has discussed with family Disposition Plan: From home and likely discharge to home pending clinical progress Consults called: None Severity of Illness: The appropriate patient status for this patient is OBSERVATION. Observation status is judged to be reasonable and necessary in order to provide the required intensity of service to ensure the patient's safety. The patient's presenting symptoms, physical  exam findings, and initial radiographic and laboratory data in the context of their medical condition is felt to place them at decreased risk for further clinical deterioration. Furthermore, it is anticipated that the patient will be medically stable for discharge from the hospital within 2 midnights of admission.   Zada Finders MD [facility]  If 7PM-7AM, please contact night-coverage [email or web address]  07/01/2022, 11:22 PM

## 2022-07-01 NOTE — Assessment & Plan Note (Signed)
Stable, no wheezing on admission.  Continue albuterol as needed. 

## 2022-07-01 NOTE — Assessment & Plan Note (Signed)
A1c 6.8%.  Hold home metformin.  Place on sensitive SSI.

## 2022-07-01 NOTE — Assessment & Plan Note (Signed)
New erythema and soft tissue swelling of the left foot with associated leukocytosis. -Continue IV Ancef

## 2022-07-01 NOTE — Assessment & Plan Note (Addendum)
Pyoderma gangrenosa Chronic wound/ulcer left lower leg due to venous insufficiency with history of biopsy-proven pyoderma gangrenosum.  Following with [facility] health dermatology and [facility] wound care. -Continue home CellCept 1000 mg twice daily -Continue home prednisone 40 mg daily -Analgesics as needed -Follow-up with dermatology and wound care as an outpatient

## 2022-07-01 NOTE — Assessment & Plan Note (Signed)
Continue rosuvastatin.  

## 2022-07-01 NOTE — ED Triage Notes (Signed)
Patient here from PCP office reporting  pain to left foot and leg. States that she has been unable to afford pain meds and was sent for pain management. Wounds cleaned and bandaged at pcp office.

## 2022-07-01 NOTE — ED Notes (Signed)
I attempted to collect labs and was unsuccessful. 

## 2022-07-02 ENCOUNTER — Telehealth: Payer: Self-pay

## 2022-07-02 DIAGNOSIS — L88 Pyoderma gangrenosum: Secondary | ICD-10-CM

## 2022-07-02 DIAGNOSIS — L97909 Non-pressure chronic ulcer of unspecified part of unspecified lower leg with unspecified severity: Secondary | ICD-10-CM

## 2022-07-02 DIAGNOSIS — E1165 Type 2 diabetes mellitus with hyperglycemia: Secondary | ICD-10-CM | POA: Diagnosis not present

## 2022-07-02 DIAGNOSIS — E7800 Pure hypercholesterolemia, unspecified: Secondary | ICD-10-CM

## 2022-07-02 DIAGNOSIS — L03116 Cellulitis of left lower limb: Secondary | ICD-10-CM | POA: Diagnosis not present

## 2022-07-02 DIAGNOSIS — J45909 Unspecified asthma, uncomplicated: Secondary | ICD-10-CM

## 2022-07-02 LAB — BASIC METABOLIC PANEL
Anion gap: 12 (ref 5–15)
BUN: 24 mg/dL — ABNORMAL HIGH (ref 8–23)
CO2: 26 mmol/L (ref 22–32)
Calcium: 9 mg/dL (ref 8.9–10.3)
Chloride: 99 mmol/L (ref 98–111)
Creatinine, Ser: 0.81 mg/dL (ref 0.44–1.00)
GFR, Estimated: >60 mL/min (ref >60)
Glucose, Bld: 79 mg/dL (ref 70–99)
Potassium: 3.9 mmol/L (ref 3.5–5.1)
Sodium: 137 mmol/L (ref 135–145)

## 2022-07-02 LAB — CBC
HCT: 39.2 % (ref 36.0–46.0)
Hemoglobin: 12.1 g/dL (ref 12.0–15.0)
MCH: 28 pg (ref 26.0–34.0)
MCHC: 30.9 g/dL (ref 30.0–36.0)
MCV: 90.7 fL (ref 80.0–100.0)
Platelets: 293 10*3/uL (ref 150–400)
RBC: 4.32 MIL/uL (ref 3.87–5.11)
RDW: 16.6 % — ABNORMAL HIGH (ref 11.5–15.5)
WBC: 15.7 10*3/uL — ABNORMAL HIGH (ref 4.0–10.5)
nRBC: 0 % (ref 0.0–0.2)

## 2022-07-02 LAB — CBG MONITORING, ED: Glucose-Capillary: 93 mg/dL (ref 70–99)

## 2022-07-02 MED ORDER — SILVER SULFADIAZINE 1 % EX CREA
TOPICAL_CREAM | Freq: Every day | CUTANEOUS | Status: DC
  Filled 2022-07-02: qty 50

## 2022-07-02 MED ORDER — CEFADROXIL 500 MG PO CAPS
1000.0000 mg | ORAL_CAPSULE | Freq: Two times a day (BID) | ORAL | 0 refills | Status: AC
Start: 2022-07-02 — End: 2022-07-09

## 2022-07-02 MED ORDER — FUROSEMIDE 40 MG PO TABS: 40.0000 mg | ORAL_TABLET | Freq: Every day | ORAL | 1 refills | Status: DC | PRN

## 2022-07-02 NOTE — Telephone Encounter (Signed)
RNCM left voicemail message for a call back. TOC consult for [facility] for wound care.   TOC will continue to follow.

## 2022-07-02 NOTE — Consult Note (Addendum)
WOC Nurse Consult Note: Reason for Consult: Consult requested for left leg cellulitis and wound.  Performed remotely after review of progress notes and photo in the EMR. Pt is familiar to [facility] [employer] team from previous admission on 4/18.  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 She has been followed by [facility] dermatology for this complex medical condition prior to admission. They have ordered Silvadene for topical treatment. Wound type: Left lower calf with full thickness wound, yellow and moist, surrounded by generalized erythremia and edema. Systemic antibiotics have been ordered to treat the cellulitis. Dressing procedure/placement/frequency: Continue present plan of care as ordered by dermatology. Topical treatment orders provided for bedside nurses to perform as follows: Apply Silvadene to left leg wound Q day, then cover with nonadherent dressing, ABD pad and kerlex.  Remove previous Silvadene each time using moist gauze.  Please re-consult if further assistance is needed.  Thank-you,  Cammie Mcgee MSN, RN, CWOCN, [place], CNS [phone]

## 2022-07-02 NOTE — Telephone Encounter (Signed)
RNCM left voicemail message for call back re: HHC/ wound care services.   TOC will continue to follow.

## 2022-07-02 NOTE — ED Notes (Signed)
Pt blood sugar was 93

## 2022-07-02 NOTE — ED Notes (Signed)
Patient being discharged from ED per Admitting MD. SW approved cab voucher for patient.

## 2022-07-02 NOTE — Discharge Summary (Addendum)
Physician Discharge Summary  Diana Sanchez GXQ:[id number] DOB: 09/05/1956 DOA: 07/01/2022  PCP: Willene Hatchet, NP  Admit date: 07/01/2022 Discharge date: 07/02/2022 Admitted From: Home Disposition: Home Recommendations for Outpatient Follow-up:  Follow up with PCP in 1 week Also recommended outpatient follow-up with a dermatologist and wound clinic as soon as possible Check CBC and BMP in 1 week Please follow up on the following pending results: None  Home Health: [facility] RN for wound. Equipment/Devices: None  Discharge Condition: Stable CODE STATUS: Full code  Follow-up Information     Willene Hatchet, NP. Schedule an appointment as soon as possible for a visit in 1 week(s).   Specialty: Nurse Practitioner Contact information: [place] [address] [phone]                 Hospital course 66 y.o. 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  Patient has chronic ulcer to medial left lower leg with biopsy-proven pyoderma gangrenosum.  Following with [facility] health dermatology and on current treatment with CellCept and prednisone 40 mg daily.  Also following with wound care.  Ulcerated wound not significantly changed from baseline.  She has no fever, chills, diaphoresis, nausea or vomiting.  In ED, stable vitals except for slightly elevated BP.  WBC 13.8 with left shift.  Otherwise, CBC and BMP without significant finding. Left foot x-ray shows diffuse soft tissue swelling without acute or destructive bony lesions, and multifocal osteoarthritis.  Patient was given IV ceftriaxone and Norco and admitted with possible cellulitis on IV Ancef.  On the day of discharge, pain and swelling improved partly due to leg elevation.  It is difficult to know if she really has  cellulitis.  She had no fever.  Leukocytosis could be from steroid.  Swelling and pain could be from edema in the setting of venous insufficiency and pyoderma gangrenosum.  Regardless, it felt reasonable to discharge on p.o. cefadroxil for outpatient follow-up.  Also recommended p.o. Lasix 40 mg daily as needed for swelling and pain in addition to leg elevation.  Evaluated by wound care RN who recommended daily dressing as below.  [facility] RN ordered.  Sepsis ruled out.  See individual problem list below for more.   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   Type 2 diabetes mellitus (HCC)   Asthma   Hypercholesterolemia  Vital signs Vitals:   07/02/22 0739 07/02/22 0800 07/02/22 0945 07/02/22 1030  BP: (!) 146/80 139/79 103/90 121/83  Pulse: (!) 104 (!) 116 (!) 106 (!) 103  Temp: 98 F (36.7 C)  98.1 F (36.7 C) 98.4 F (36.9 C)  Resp: 18 16 16 16   SpO2: 100% 97% 98% 100%  TempSrc: Oral   Oral     Discharge exam  GENERAL: No apparent distress.  Nontoxic. HEENT: MMM.  Vision and hearing grossly intact.  NECK: Supple.  No apparent JVD.  RESP:  No IWOB.  Fair aeration bilaterally. CVS:  RRR. Heart sounds normal.  ABD/GI/GU: BS+. Abd soft, NTND.  MSK/EXT:  Moves extremities.  Left leg ulceration.  Pyoderma gangrenosum.  1+ left foot edema SKIN: Left leg ulceration as above NEURO:  Awake and alert. Oriented appropriately.  No apparent focal neuro deficit. PSYCH: Calm. Normal affect.   Discharge Instructions Discharge Instructions     Call MD for:  extreme fatigue   Complete by: As directed    Call MD for:  persistant dizziness or light-headedness   Complete by: As directed    Call MD for:  redness, tenderness, or signs of infection (pain, swelling, redness, odor or green/yellow discharge around incision site)   Complete  by: As directed    Call MD for:  severe uncontrolled pain   Complete by: As directed    Call MD for:  temperature >100.4   Complete by: As directed    Diet - low sodium heart healthy   Complete by: As directed    Diet general   Complete by: As directed    Discharge instructions   Complete by: As directed    It has been a pleasure taking care of you!  You were hospitalized due to left leg swelling, pain and wound.  It is difficult to tell if his symptoms are from the underlying pyoderma gangrenosum or cellulitis.  We have started you on antibiotics to cover for possible staph/infection.  We also suggest trying fluid medication (Lasix) leg elevation to help with swelling and pain. Follow-up with your primary care doctor, dermatologist and wound clinic as soon as possible.  Review your new medication list and the directions on your medications before you take them.   Take care,   Discharge wound care:   Complete by: As directed    Apply Silvadene to left leg wound Q day, then cover with nonadherent dressing, ABD pad and kerlex.  Remove previous Silvadene each time using moist gauze before applying more   Increase activity slowly   Complete by: As directed       Allergies as of 07/02/2022       Reactions   Tramadol    nausea Other reaction(s): Unknown        Medication List     TAKE these medications    Accu-Chek Guide w/Device Kit by Does not apply route.   albuterol 108 (90 Base) MCG/ACT inhaler Commonly known as: VENTOLIN HFA Inhale 1-2 puffs into the lungs every 6 (six) hours as needed for wheezing or shortness of breath.   Alcohol Wipes 70 % Pads by Does not apply route.   aspirin EC 81 MG tablet Take 81 mg by mouth daily.   cefadroxil 500 MG capsule Commonly known as: DURICEF Take 2 capsules (1,000 mg total) by mouth 2 (two) times daily for 7 days.   clobetasol ointment 0.05 % Commonly known as: TEMOVATE Apply 1 application. topically every other day.    [employer] Acetaminophen Ex St 500 MG tablet Generic drug: acetaminophen Take 500 mg by mouth 2 (two) times daily as needed for moderate pain.   cyclobenzaprine 10 MG tablet Commonly known as: FLEXERIL Take 10 mg by mouth 3 (three) times daily as needed for muscle spasms.   furosemide 40 MG tablet Commonly known as: Lasix Take 1 tablet (40 mg total) by mouth daily as needed for fluid or edema.   gabapentin 100 MG capsule Commonly known as: NEURONTIN Take 100 mg by mouth 2 (two) times daily.   Linzess 72 MCG capsule Generic drug: linaclotide Take 72 mcg by mouth daily before breakfast.   metFORMIN 500 MG tablet Commonly known as: GLUCOPHAGE Take 500 mg by mouth 2 (two) times daily.   multivitamin tablet Take 1 tablet  by mouth daily.   mycophenolate 500 MG tablet Commonly known as: CELLCEPT Take 1,000 mg by mouth 2 (two) times daily.   Narcan 4 MG/0.1ML Liqd nasal spray kit Generic drug: naloxone   oxyCODONE-acetaminophen 10-325 MG tablet Commonly known as: PERCOCET Take 1 tablet by mouth every 8 (eight) hours as needed. for pain   predniSONE 20 MG tablet Commonly known as: DELTASONE Take 40 mg by mouth daily.   rizatriptan 5 MG tablet Commonly known as: MAXALT Take 10 mg by mouth See admin instructions. TAKE TWO TABLETS (10 MG DOSE) BY MOUTH AS NEEDED FOR MIGRAINE. MAY REPEAT IN 2 HOURS IF NEEDED   rosuvastatin 20 MG tablet Commonly known as: CRESTOR Take 20 mg by mouth at bedtime.   silver sulfADIAZINE 1 % cream Commonly known as: SILVADENE Apply 1 application. topically every other day.   triamcinolone cream 0.1 % Commonly known as: KENALOG Apply 1 application. topically 2 (two) times daily as needed (for itching).   VITAMIN C PO Take 1 tablet by mouth daily.               Discharge Care Instructions  (From admission, onward)           Start     Ordered   07/02/22 0000  Discharge wound care:       Comments: Apply Silvadene to left leg wound Q  day, then cover with nonadherent dressing, ABD pad and kerlex.  Remove previous Silvadene each time using moist gauze before applying more   07/02/22 1038            Consultations: None  Procedures/Studies: None   DG Foot Complete Left  Result Date: 07/01/2022 CLINICAL DATA:  Left foot pain EXAM: LEFT FOOT - COMPLETE 3+ VIEW COMPARISON:  None Available. FINDINGS: Frontal, oblique, and lateral views of the left foot are obtained. There is diffuse soft tissue swelling. No subcutaneous gas or radiopaque foreign body. There are no acute or destructive bony abnormalities. Multifocal osteoarthritis greatest at the first metatarsophalangeal joint and throughout the midfoot. IMPRESSION: 1. Diffuse soft tissue swelling. 2. No acute or destructive bony lesions. 3. Multifocal osteoarthritis as above. Electronically Signed   By: Randa Ngo M.D.   On: 07/01/2022 20:36   VAS [place] LOWER EXTREMITY VENOUS (DVT) (7a-7p)  Result Date: 07/01/2022  Lower Venous DVT Study Patient Name:  Diana Sanchez [facility] [place]  Date of Exam:   07/01/2022 Medical Rec #: [id number]                   Accession #:    [id number] Date of Birth: [birth year]                   Patient Gender: F Patient Age:   36 years Exam Location:  [facility] Procedure:      VAS [place] LOWER EXTREMITY VENOUS (DVT) Referring Phys: COOPER ROBBINS --------------------------------------------------------------------------------  Indications: Pain.  Risk Factors: None identified. Limitations: Poor ultrasound/tissue interface, bandages, open wound and patient positioning, patient movement, patient pain tolerance. Comparison Study: No prior studies. Performing Technologist: Oliver Hum RVT  Examination Guidelines: A complete evaluation includes B-mode imaging, spectral Doppler, color Doppler, and power Doppler as needed of all accessible portions of each vessel. Bilateral testing is considered an integral part of a complete examination. Limited  examinations for reoccurring indications may be performed as noted. The reflux portion of the exam is performed with the patient in reverse Trendelenburg.  +-----+---------------+---------+-----------+----------+--------------+ RIGHTCompressibilityPhasicitySpontaneityPropertiesThrombus Aging +-----+---------------+---------+-----------+----------+--------------+ CFV  Full  Yes      Yes                                 +-----+---------------+---------+-----------+----------+--------------+   +---------+---------------+---------+-----------+----------+-------------------+ LEFT     CompressibilityPhasicitySpontaneityPropertiesThrombus Aging      +---------+---------------+---------+-----------+----------+-------------------+ CFV      Full           Yes      Yes                                      +---------+---------------+---------+-----------+----------+-------------------+ SFJ      Full                                                             +---------+---------------+---------+-----------+----------+-------------------+ FV Prox  Full                                                             +---------+---------------+---------+-----------+----------+-------------------+ FV Mid                  Yes      Yes                                      +---------+---------------+---------+-----------+----------+-------------------+ FV Distal               Yes      Yes                                      +---------+---------------+---------+-----------+----------+-------------------+ PFV      Full                                                             +---------+---------------+---------+-----------+----------+-------------------+ POP      Full           Yes      Yes                                      +---------+---------------+---------+-----------+----------+-------------------+ PTV      Full                                                              +---------+---------------+---------+-----------+----------+-------------------+ PERO  Not well visualized +---------+---------------+---------+-----------+----------+-------------------+     Summary: RIGHT: - No evidence of common femoral vein obstruction.  LEFT: - There is no evidence of deep vein thrombosis in the lower extremity. However, portions of this examination were limited- see technologist comments above.  - No cystic structure found in the popliteal fossa.  *See table(s) above for measurements and observations. Electronically signed by Jamelle Haring on 07/01/2022 at 8:23:46 PM.    Final    MM 3D SCREEN BREAST BILATERAL  Result Date: 06/30/2022 CLINICAL DATA:  Screening. EXAM: DIGITAL SCREENING BILATERAL MAMMOGRAM WITH TOMOSYNTHESIS AND CAD TECHNIQUE: Bilateral screening digital craniocaudal and mediolateral oblique mammograms were obtained. Bilateral screening digital breast tomosynthesis was performed. The images were evaluated with computer-aided detection. COMPARISON:  Previous exam(s). ACR Breast Density Category c: The breast tissue is heterogeneously dense, which may obscure small masses. FINDINGS: There are no findings suspicious for malignancy. IMPRESSION: No mammographic evidence of malignancy. A result letter of this screening mammogram will be mailed directly to the patient. RECOMMENDATION: Screening mammogram in one year. (Code:SM-B-01Y) BI-RADS CATEGORY  1: Negative. Electronically Signed   By: Margarette Canada M.D.   On: 06/30/2022 16:02       The results of significant diagnostics from this hospitalization (including imaging, microbiology, ancillary and laboratory) are listed below for reference.     Microbiology: No results found for this or any previous visit (from the past 240 hour(s)).   Labs:  CBC: Recent Labs  Lab 07/01/22 1312 07/02/22 0500  WBC 13.8* 15.7*  NEUTROABS 9.5*  --   HGB  12.4 12.1  HCT 40.7 39.2  MCV 93.1 90.7  PLT 254 293   BMP &GFR Recent Labs  Lab 07/01/22 1312 07/02/22 0500  NA 139 137  K 4.1 3.9  CL 102 99  CO2 25 26  GLUCOSE 81 79  BUN 27* 24*  CREATININE 0.74 0.81  CALCIUM 9.1 9.0   CrCl cannot be calculated (Unknown ideal weight.). Liver & Pancreas: Recent Labs  Lab 07/01/22 1312  AST 22  ALT 23  ALKPHOS 53  BILITOT 0.5  PROT 7.4  ALBUMIN 3.6   No results for input(s): "LIPASE", "AMYLASE" in the last 168 hours. No results for input(s): "AMMONIA" in the last 168 hours. Diabetic: No results for input(s): "HGBA1C" in the last 72 hours. Recent Labs  Lab 07/02/22 0743  GLUCAP 93   Cardiac Enzymes: No results for input(s): "CKTOTAL", "CKMB", "CKMBINDEX", "TROPONINI" in the last 168 hours. No results for input(s): "PROBNP" in the last 8760 hours. Coagulation Profile: No results for input(s): "INR", "PROTIME" in the last 168 hours. Thyroid Function Tests: No results for input(s): "TSH", "T4TOTAL", "FREET4", "T3FREE", "THYROIDAB" in the last 72 hours. Lipid Profile: No results for input(s): "CHOL", "HDL", "LDLCALC", "TRIG", "CHOLHDL", "LDLDIRECT" in the last 72 hours. Anemia Panel: No results for input(s): "VITAMINB12", "FOLATE", "FERRITIN", "TIBC", "IRON", "RETICCTPCT" in the last 72 hours. Urine analysis: No results found for: "COLORURINE", "APPEARANCEUR", "LABSPEC", "PHURINE", "GLUCOSEU", "HGBUR", "BILIRUBINUR", "KETONESUR", "PROTEINUR", "UROBILINOGEN", "NITRITE", "LEUKOCYTESUR" Sepsis Labs: Invalid input(s): "PROCALCITONIN", "LACTICIDVEN"   SIGNED:  Mercy Riding, MD  [facility] 07/02/2022, 5:03 PM

## 2022-07-21 ENCOUNTER — Ambulatory Visit (INDEPENDENT_AMBULATORY_CARE_PROVIDER_SITE_OTHER): Payer: Medicare Other | Admitting: Podiatry

## 2022-07-21 DIAGNOSIS — M79674 Pain in right toe(s): Secondary | ICD-10-CM | POA: Diagnosis not present

## 2022-07-21 DIAGNOSIS — B351 Tinea unguium: Secondary | ICD-10-CM

## 2022-07-21 DIAGNOSIS — M79675 Pain in left toe(s): Secondary | ICD-10-CM

## 2022-07-26 ENCOUNTER — Encounter: Payer: Self-pay | Admitting: Podiatry

## 2022-07-26 NOTE — Progress Notes (Signed)
  Subjective:  Patient ID: Diana Sanchez, female    DOB: 1956-04-14,  MRN: [id number]  Diana Sanchez presents to clinic today for painful elongated mycotic toenails 1-5 bilaterally which are tender when wearing enclosed shoe gear. Pain is relieved with periodic professional debridement.  Patient states she will be going to [facility] for Wound Care of leg wounds.   New problem(s): None.   PCP is Estevan Oaks, NP , and last visit was July, 2023.  Allergies  Allergen Reactions   Tramadol     nausea Other reaction(s): Unknown    Review of Systems: Negative except as noted in the HPI.  Objective: No changes noted in today's physical examination. Constitutional Diana Sanchez is a pleasant 66 y.o. African American female, WD, WN in NAD. AAO x 3.   Vascular CFT <3 seconds b/l LE. Palpable DP pulse(s) b/l LE. Palpable PT pulse(s) b/l LE. Pedal hair absent. No pain with calf compression RLE. Nonpitting edema noted BLE. Evidence of chronic venous insufficiency b/l LE. No cyanosis or clubbing noted b/l feet.  Neurologic Normal speech. Oriented to person, place, and time. Protective sensation intact 5/5 intact bilaterally with 10g monofilament b/l. Vibratory sensation intact b/l.  Dermatologic Dressing noted LE and is clean, dry and intact. No interdigital macerations noted b/l LE. Toenails 1-5 b/l elongated, discolored, dystrophic, thickened, crumbly with subungual debris and tenderness to dorsal palpation.  Orthopedic: Muscle strength 5/5 to all lower extremity muscle groups bilaterally. HAV with bunion deformity noted b/l LE. Hammertoe deformity noted 2-5 b/l.   Radiographs: None  Assessment/Plan: 1. Pain due to onychomycosis of toenails of both feet     -Examined patient. -She will be going to [facility] for Wound Care of LE. -Continue foot and shoe inspections daily. Monitor blood glucose per PCP/Endocrinologist's  recommendations. -Toenails 1-5 b/l were debrided in length and girth with sterile nail nippers and dremel without iatrogenic bleeding.  -Patient/POA to call should there be question/concern in the interim.   Return in about 3 months (around 10/21/2022).  Freddie Breech, DPM

## 2022-09-03 ENCOUNTER — Other Ambulatory Visit: Payer: Self-pay | Admitting: Nurse Practitioner

## 2022-09-03 ENCOUNTER — Ambulatory Visit
Admission: RE | Admit: 2022-09-03 | Discharge: 2022-09-03 | Disposition: A | Discharge status: Home / Self Care | Payer: Medicare Other | Source: Ambulatory Visit | Attending: Nurse Practitioner | Admitting: Nurse Practitioner

## 2022-09-03 DIAGNOSIS — L89892 Pressure ulcer of other site, stage 2: Secondary | ICD-10-CM

## 2022-09-07 ENCOUNTER — Emergency Department (HOSPITAL_COMMUNITY)
Admission: EM | Admit: 2022-09-07 | Discharge: 2022-09-08 | Disposition: A | Discharge status: Home / Self Care | Payer: Medicare Other | Attending: Emergency Medicine | Admitting: Emergency Medicine

## 2022-09-07 ENCOUNTER — Emergency Department (HOSPITAL_BASED_OUTPATIENT_CLINIC_OR_DEPARTMENT_OTHER): Payer: Medicare Other

## 2022-09-07 ENCOUNTER — Other Ambulatory Visit: Payer: Self-pay

## 2022-09-07 ENCOUNTER — Emergency Department (HOSPITAL_COMMUNITY): Payer: Medicare Other

## 2022-09-07 DIAGNOSIS — Z79899 Other long term (current) drug therapy: Secondary | ICD-10-CM | POA: Insufficient documentation

## 2022-09-07 DIAGNOSIS — M329 Systemic lupus erythematosus, unspecified: Secondary | ICD-10-CM | POA: Insufficient documentation

## 2022-09-07 DIAGNOSIS — Z7982 Long term (current) use of aspirin: Secondary | ICD-10-CM | POA: Diagnosis not present

## 2022-09-07 DIAGNOSIS — I517 Cardiomegaly: Secondary | ICD-10-CM | POA: Diagnosis not present

## 2022-09-07 DIAGNOSIS — L03116 Cellulitis of left lower limb: Secondary | ICD-10-CM | POA: Insufficient documentation

## 2022-09-07 DIAGNOSIS — Z7984 Long term (current) use of oral hypoglycemic drugs: Secondary | ICD-10-CM | POA: Diagnosis not present

## 2022-09-07 DIAGNOSIS — R52 Pain, unspecified: Secondary | ICD-10-CM | POA: Diagnosis not present

## 2022-09-07 LAB — LACTIC ACID, PLASMA
Lactic Acid, Venous: 2.9 mmol/L (ref 0.5–1.9)
Lactic Acid, Venous: 1.1 mmol/L (ref 0.5–1.9)

## 2022-09-07 LAB — CBC WITH DIFFERENTIAL/PLATELET
Abs Immature Granulocytes: 0.05 10*3/uL (ref 0.00–0.07)
Basophils Absolute: 0 10*3/uL (ref 0.0–0.1)
Basophils Relative: 0 %
Eosinophils Absolute: 0.1 10*3/uL (ref 0.0–0.5)
Eosinophils Relative: 1 %
HCT: 38.9 % (ref 36.0–46.0)
Hemoglobin: 11.8 g/dL — ABNORMAL LOW (ref 12.0–15.0)
Immature Granulocytes: 1 %
Lymphocytes Relative: 27 %
Lymphs Abs: 2.1 10*3/uL (ref 0.7–4.0)
MCH: 28 pg (ref 26.0–34.0)
MCHC: 30.3 g/dL (ref 30.0–36.0)
MCV: 92.2 fL (ref 80.0–100.0)
Monocytes Absolute: 1 10*3/uL (ref 0.1–1.0)
Monocytes Relative: 13 %
Neutro Abs: 4.5 10*3/uL (ref 1.7–7.7)
Neutrophils Relative %: 58 %
Platelets: 283 10*3/uL (ref 150–400)
RBC: 4.22 MIL/uL (ref 3.87–5.11)
RDW: 15.2 % (ref 11.5–15.5)
WBC: 7.7 10*3/uL (ref 4.0–10.5)
nRBC: 0 % (ref 0.0–0.2)

## 2022-09-07 LAB — COMPREHENSIVE METABOLIC PANEL
ALT: 12 U/L (ref 0–44)
AST: 23 U/L (ref 15–41)
Albumin: 3.5 g/dL (ref 3.5–5.0)
Alkaline Phosphatase: 62 U/L (ref 38–126)
Anion gap: 12 (ref 5–15)
BUN: 23 mg/dL (ref 8–23)
CO2: 21 mmol/L — ABNORMAL LOW (ref 22–32)
Calcium: 9.4 mg/dL (ref 8.9–10.3)
Chloride: 105 mmol/L (ref 98–111)
Creatinine, Ser: 0.82 mg/dL (ref 0.44–1.00)
GFR, Estimated: >60 mL/min (ref >60)
Glucose, Bld: 78 mg/dL (ref 70–99)
Potassium: 3.9 mmol/L (ref 3.5–5.1)
Sodium: 138 mmol/L (ref 135–145)
Total Bilirubin: 0.5 mg/dL (ref 0.3–1.2)
Total Protein: 7.4 g/dL (ref 6.5–8.1)

## 2022-09-07 LAB — CBG MONITORING, ED: Glucose-Capillary: 76 mg/dL (ref 70–99)

## 2022-09-07 LAB — PROTIME-INR
INR: 1 (ref 0.8–1.2)
Prothrombin Time: 13.4 seconds (ref 11.4–15.2)

## 2022-09-07 LAB — APTT: aPTT: 30 seconds (ref 24–36)

## 2022-09-07 MED ORDER — LACTATED RINGERS IV BOLUS (SEPSIS)
1000.0000 mL | Freq: Once | INTRAVENOUS | Status: AC
  Administered 2022-09-07: 1000 mL via INTRAVENOUS

## 2022-09-07 MED ORDER — MORPHINE SULFATE (PF) 4 MG/ML IV SOLN
4.0000 mg | Freq: Once | INTRAVENOUS | Status: AC
  Administered 2022-09-07: 4 mg via INTRAVENOUS
  Filled 2022-09-07: qty 1

## 2022-09-07 MED ORDER — AMOXICILLIN-POT CLAVULANATE 875-125 MG PO TABS: 1.0000 | ORAL_TABLET | Freq: Two times a day (BID) | ORAL | 0 refills | Status: DC

## 2022-09-07 MED ORDER — SODIUM CHLORIDE 0.9 % IV SOLN
2.0000 g | INTRAVENOUS | Status: DC
  Administered 2022-09-07: 2 g via INTRAVENOUS
  Filled 2022-09-07 (×2): qty 20

## 2022-09-07 MED ORDER — METRONIDAZOLE 500 MG PO TABS
500.0000 mg | ORAL_TABLET | Freq: Two times a day (BID) | ORAL | Status: DC
  Administered 2022-09-07: 500 mg via ORAL
  Filled 2022-09-07: qty 1

## 2022-09-07 NOTE — Discharge Instructions (Signed)
The ultrasound did not show any signs of blood clot.  Take the antibiotics as prescribed for the cellulitis.  Follow-up with your doctor to be rechecked.  Return to the ER for high fevers worsening symptoms

## 2022-09-07 NOTE — ED Provider Notes (Signed)
[facility] DEPT Provider Note   CSN: [id number] Arrival date & time: 09/07/22  1827     History  Chief Complaint  Patient presents with   Wound Infection         Diana Sanchez is a 66 y.o. female.  HPI   Patient has a history of DVT osteoarthritis rheumatoid arthritis chronic pain syndrome, polymyalgia rheumatica, lupus, venous stasis dermatitis, venous stasis ulcer, calciphylaxis, chronic lymphedema.  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  She has been followed by the wound care center at [facility].  Patient previously had been evaluated by [facility] dermatology.  Patient then followed with [facility].  Patient has been seen by wound center at [facility] earlier this month on September 11.  They recommend starting Trental and lidocaine ointment.  Patient came to the ED with complaints of increased pain in the last couple days.  She denies any recent fevers.  She has noticed some increasing swelling.  Home Medications Prior to Admission medications   Medication Sig Start Date End Date Taking? Authorizing Provider  albuterol (PROVENTIL HFA;VENTOLIN HFA) 108 (90 Base) MCG/ACT inhaler Inhale 1-2 puffs into the lungs every 6 (six) hours as needed for wheezing or shortness of breath. 02/14/12  Yes [provider]  amoxicillin-clavulanate (AUGMENTIN) 875-125 MG tablet Take 1 tablet by mouth every 12 (twelve) hours. 09/07/22  Yes Dorie Rank, MD  Ascorbic Acid (VITAMIN C PO) Take 1 tablet by mouth daily.   Yes [provider]  aspirin EC 81 MG tablet Take 81 mg by mouth daily.   Yes [provider]  [facility] ACETAMINOPHEN EX ST 500 MG tablet Take 500 mg by mouth 2 (two) times daily as needed for moderate pain. 03/06/22  Yes [provider]  cyclobenzaprine (FLEXERIL) 10 MG tablet Take 10 mg by mouth 3 (three) times daily as needed for muscle spasms.   Yes [provider]  doxycycline  (VIBRAMYCIN) 100 MG capsule Take 1 capsule by mouth 2 (two) times daily. 09/02/22  Yes [provider]  furosemide (LASIX) 40 MG tablet Take 1 tablet (40 mg total) by mouth daily as needed for fluid or edema. 07/02/22 07/02/23 Yes Mercy Riding, MD  gabapentin (NEURONTIN) 100 MG capsule Take 100 mg by mouth 2 (two) times daily. 01/15/22  Yes [provider]  lidocaine (XYLOCAINE) 5 % ointment Apply 1 Application topically 2 (two) times daily as needed for mild pain. 09/03/22  Yes [provider]  LINZESS 72 MCG capsule Take 72 mcg by mouth daily before breakfast. 01/12/19  Yes [provider]  metFORMIN (GLUCOPHAGE) 500 MG tablet Take 500 mg by mouth 2 (two) times daily. 06/17/22  Yes [provider]  Multiple Vitamin (MULTIVITAMIN) tablet Take 1 tablet by mouth daily.   Yes [provider]  NARCAN 4 MG/0.1ML LIQD nasal spray kit Place 1 spray into the nose daily as needed (For overdose). 08/09/18  Yes [provider]  oxyCODONE-acetaminophen (PERCOCET) 10-325 MG tablet Take 1 tablet by mouth every 8 (eight) hours as needed for pain. for pain 03/16/22  Yes [provider]  pentoxifylline (TRENTAL) 400 MG CR tablet Take 400 mg by mouth 2 (two) times daily. 07/30/22  Yes [provider]  rizatriptan (MAXALT) 5 MG tablet Take 10 mg by mouth daily as needed for migraine.   Yes [provider]  rosuvastatin (CRESTOR) 20 MG tablet Take 20 mg by mouth at bedtime. 10/07/21  Yes [provider]  triamcinolone cream (KENALOG) 0.1 % Apply 1 application. topically 2 (two) times daily as needed (for itching). 11/19/21  Yes [provider]  Alcohol Swabs (ALCOHOL WIPES) 70 % PADS by Does not apply route. 04/04/20   [provider]  Blood Glucose Monitoring Suppl (ACCU-CHEK GUIDE) w/Device KIT by Does not apply route. 04/04/20   [provider]  clobetasol ointment (TEMOVATE) 0.10 % Apply 1 application.  topically every other day. Patient not taking: Reported on 09/07/2022 03/09/22   [provider]  mycophenolate (CELLCEPT) 500 MG tablet Take 1,000 mg by mouth 2 (two) times daily. Patient not taking: Reported on 09/07/2022 06/10/22   [provider]  predniSONE (DELTASONE) 20 MG tablet Take 40 mg by mouth daily. Patient not taking: Reported on 09/07/2022 03/09/22   [provider]      Allergies    Tramadol    Review of Systems   Review of Systems  Physical Exam Updated Vital Signs BP (!) 153/82   Pulse 94   Temp 98 F (36.7 C)   Resp 16   Ht 1.803 m (5' 11" )   Wt 86.2 kg   SpO2 100%   BMI 26.50 kg/m  Physical Exam Vitals and nursing note reviewed.  Constitutional:      General: She is not in acute distress.    Appearance: She is well-developed.  HENT:     Head: Normocephalic and atraumatic.     Right Ear: External ear normal.     Left Ear: External ear normal.  Eyes:     General: No scleral icterus.       Right eye: No discharge.        Left eye: No discharge.     Conjunctiva/sclera: Conjunctivae normal.  Neck:     Trachea: No tracheal deviation.  Cardiovascular:     Rate and Rhythm: Normal rate and regular rhythm.  Pulmonary:     Effort: Pulmonary effort is normal. No respiratory distress.     Breath sounds: Normal breath sounds. No stridor. No wheezing or rales.  Abdominal:     General: Bowel sounds are normal. There is no distension.     Palpations: Abdomen is soft.     Tenderness: There is no abdominal tenderness. There is no guarding or rebound.  Musculoskeletal:        General: Swelling and tenderness present. No deformity.     Cervical back: Neck supple.     Comments: Increased warmth, erythema and tenderness of the left foot and lower leg, hyperpigmentation of the right lower extremity compared to left, strong dorsalis pedal pulses bilaterally, ulcerative wound of the right lower leg down to subcutaneous tissue, no bone or tendon  visualized at the base of the wound  Skin:    General: Skin is warm and dry.     Findings: No rash.  Neurological:     General: No focal deficit present.     Mental Status: She is alert.     Cranial Nerves: No cranial nerve deficit (no facial droop, extraocular movements intact, no slurred speech).     Sensory: No sensory deficit.     Motor: No abnormal muscle tone or seizure activity.     Coordination: Coordination normal.  Psychiatric:        Mood and Affect: Mood normal.     ED Results / Procedures / Treatments   Labs (all labs ordered are listed, but only abnormal results are displayed) Labs Reviewed  LACTIC ACID, PLASMA - Abnormal; Notable for the following components:      Result Value   Lactic Acid, Venous 2.9 (*)    All other components within normal limits  COMPREHENSIVE METABOLIC PANEL - Abnormal; Notable for the following components:   CO2 21 (*)    All other components within normal limits  CBC WITH DIFFERENTIAL/PLATELET - Abnormal; Notable for the following components:   Hemoglobin 11.8 (*)    All other components within normal limits  CULTURE, BLOOD (ROUTINE X 2)  CULTURE, BLOOD (ROUTINE X 2)  LACTIC ACID, PLASMA  PROTIME-INR  APTT  URINALYSIS, ROUTINE W REFLEX MICROSCOPIC  CBG MONITORING, ED    EKG EKG Interpretation  Date/Time:  Tuesday September 07 2022 19:44:30 EDT Ventricular Rate:  92 PR Interval:  181 QRS Duration: 86 QT Interval:  347 QTC Calculation: 430 R Axis:   83 Text Interpretation: Sinus rhythm Borderline right axis deviation Minimal ST elevation, inferior leads No old tracing to compare Confirmed by Dorie Rank [phone]) on 09/07/2022 7:58:57 PM  Radiology DG Chest Port 1 View  Result Date: 09/07/2022 CLINICAL DATA:  Questionable sepsis; evaluate for abnormality EXAM: PORTABLE CHEST 1 VIEW COMPARISON:  None Available. FINDINGS: Borderline cardiomegaly. Aortic atherosclerotic calcification. No focal consolidation, pleural effusion, or  pneumothorax. No acute osseous abnormality. IMPRESSION: No active disease. Electronically Signed   By: Placido Sou M.D.   On: 09/07/2022 20:01   VAS [place] LOWER EXTREMITY VENOUS (DVT) (7a-7p)  Result Date: 09/07/2022  Lower Venous DVT Study Patient Name:  Diana Sanchez  Date of Exam:   09/07/2022 Medical Rec #: [id number]                   Accession #:    [id number] Date of Birth: 04-Sep-1956                   Patient Gender: F Patient Age:   73 years Exam [facility]:  [facility] Procedure:      VAS [place] LOWER EXTREMITY VENOUS (DVT) Referring Phys: Wille Glaser Arleny Kruger --------------------------------------------------------------------------------  Indications: Pain & warmth on LLE - chronic wound present.  Risk Factors: HX of DVT. Limitations: Poor ultrasound/tissue interface. Comparison Study: Previous exam on 07/01/22 was negative for DVT. Performing Technologist: Rogelia Rohrer RVT, RDMS  Examination Guidelines: A complete evaluation includes B-mode imaging, spectral Doppler, color Doppler, and power Doppler as needed of all accessible portions of each vessel. Bilateral testing is considered an integral part of a complete examination. Limited examinations for reoccurring indications may be performed as noted. The reflux portion of the exam is performed with the patient in reverse Trendelenburg.  +-----+---------------+---------+-----------+----------+--------------+ RIGHTCompressibilityPhasicitySpontaneityPropertiesThrombus Aging +-----+---------------+---------+-----------+----------+--------------+ CFV  Full           Yes      Yes                                 +-----+---------------+---------+-----------+----------+--------------+   +---------+---------------+---------+-----------+----------+-------------------+ LEFT     CompressibilityPhasicitySpontaneityPropertiesThrombus Aging      +---------+---------------+---------+-----------+----------+-------------------+ CFV      Full            Yes      Yes                                      +---------+---------------+---------+-----------+----------+-------------------+ SFJ      Full                                                             +---------+---------------+---------+-----------+----------+-------------------+  FV Prox  Full           Yes      Yes                                      +---------+---------------+---------+-----------+----------+-------------------+ FV Mid   Full           Yes      Yes                                      +---------+---------------+---------+-----------+----------+-------------------+ FV DistalFull           Yes      Yes                                      +---------+---------------+---------+-----------+----------+-------------------+ PFV      Full                                                             +---------+---------------+---------+-----------+----------+-------------------+ POP      Full           Yes      Yes                                      +---------+---------------+---------+-----------+----------+-------------------+ PTV                                                   Not well visualized +---------+---------------+---------+-----------+----------+-------------------+ PERO     Full                                                             +---------+---------------+---------+-----------+----------+-------------------+    Summary: RIGHT: - No evidence of common femoral vein obstruction.  LEFT: - There is no evidence of deep vein thrombosis in the lower extremity.  - Ultrasound characteristics of enlarged lymph nodes noted in the groin. Subcutaneous edema in area of the calf.  *See table(s) above for measurements and observations.    Preliminary     Procedures Procedures    Medications Ordered in ED Medications  cefTRIAXone (ROCEPHIN) 2 g in sodium chloride 0.9 % 100 mL IVPB (0 g Intravenous Stopped 09/07/22 2244)     And  metroNIDAZOLE (FLAGYL) tablet 500 mg (500 mg Oral Given 09/07/22 2018)  morphine (PF) 4 MG/ML injection 4 mg (4 mg Intravenous Given 09/07/22 2003)  lactated ringers bolus 1,000 mL (0 mLs Intravenous Stopped 09/07/22 2244)    ED Course/ Medical Decision Making/ A&P Clinical Course as of 09/07/22 2314  Tue Sep 07, 2022  1930 Doppler study negative for dvt.   [JK]  2042 CBC with Differential(!) CBC normal [  JK]  2043 Comprehensive metabolic panel(!) Metabolic panel normal [JK]  2105 Lactic acid level elevated 2.9.  Previously elevated 2.5 [JK]    Clinical Course User Index [JK] Dorie Rank, MD                           Medical Decision Making Differential diagnosis includes but not limited to, acute vascular occlusion, DVT, cellulitis  Amount and/or Complexity of Data Reviewed Labs: ordered. Decision-making details documented in ED Course. Radiology: ordered. ECG/medicine tests: ordered and independent interpretation performed.  Risk Prescription drug management. Parenteral controlled substances. Drug therapy requiring intensive monitoring for toxicity.   Patient has a long history of ulcerative lesion on her leg.  Outpatient records were reviewed.  She has been seen by vascular surgery as well as dermatology as an outpatient.  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  Patient's exam today was concerning for the possible infection.  She did have increased warmth and tenderness.  Doppler study was performed and there is no evidence of deep venous thrombosis.  Patient also has good arterial blood flow.  No signs of acute vascular occlusion.  Patient's lactic acid level slightly elevated initially.  Otherwise she appeared afebrile nontoxic with normal white count.  Suspicion for evolving sepsis is low.  Patient was given IV antibiotics.  She was given IV fluids.  Repeat lactic acid level was normal.  Think she is  appropriate for outpatient antibiotic regimen with close follow-up.  Evaluation and diagnostic testing in the emergency department does not suggest an emergent condition requiring admission or immediate intervention beyond what has been performed at this time.  The patient is safe for discharge and has been instructed to return immediately for worsening symptoms, change in symptoms or any other concerns.        Final Clinical Impression(s) / ED Diagnoses Final diagnoses:  Cellulitis of left lower extremity    Rx / DC Orders ED Discharge Orders          Ordered    amoxicillin-clavulanate (AUGMENTIN) 875-125 MG tablet  Every 12 hours        09/07/22 2313              Dorie Rank, MD 09/07/22 2317

## 2022-09-07 NOTE — ED Notes (Signed)
Ultrasound at bedside

## 2022-09-07 NOTE — Progress Notes (Signed)
LLE venous duplex has been completed.  Preliminary results given to Dr. Tomi Bamberger.   Results can be found under chart review under CV PROC. 09/07/2022 7:52 PM Tequisha Maahs RVT, RDMS

## 2022-09-12 LAB — CULTURE, BLOOD (ROUTINE X 2)
Culture: NO GROWTH
Culture: NO GROWTH
Special Requests: ADEQUATE
Special Requests: ADEQUATE

## 2022-10-27 ENCOUNTER — Ambulatory Visit: Payer: Medicare Other | Admitting: Podiatry

## 2023-01-06 ENCOUNTER — Encounter (HOSPITAL_COMMUNITY): Payer: Self-pay

## 2023-01-06 ENCOUNTER — Emergency Department (HOSPITAL_COMMUNITY)
Admission: EM | Admit: 2023-01-06 | Discharge: 2023-01-10 | Disposition: A | Discharge status: Home / Self Care | Payer: 59 | Attending: Emergency Medicine | Admitting: Emergency Medicine

## 2023-01-06 ENCOUNTER — Emergency Department (HOSPITAL_BASED_OUTPATIENT_CLINIC_OR_DEPARTMENT_OTHER): Admit: 2023-01-06 | Discharge: 2023-01-06 | Disposition: A | Discharge status: Home / Self Care | Payer: 59

## 2023-01-06 ENCOUNTER — Other Ambulatory Visit: Payer: Self-pay

## 2023-01-06 ENCOUNTER — Emergency Department (HOSPITAL_COMMUNITY): Payer: 59

## 2023-01-06 DIAGNOSIS — S8991XA Unspecified injury of right lower leg, initial encounter: Secondary | ICD-10-CM | POA: Diagnosis present

## 2023-01-06 DIAGNOSIS — M79604 Pain in right leg: Secondary | ICD-10-CM

## 2023-01-06 DIAGNOSIS — R799 Abnormal finding of blood chemistry, unspecified: Secondary | ICD-10-CM | POA: Diagnosis not present

## 2023-01-06 DIAGNOSIS — S81801A Unspecified open wound, right lower leg, initial encounter: Secondary | ICD-10-CM | POA: Diagnosis not present

## 2023-01-06 DIAGNOSIS — I10 Essential (primary) hypertension: Secondary | ICD-10-CM | POA: Diagnosis not present

## 2023-01-06 DIAGNOSIS — R52 Pain, unspecified: Secondary | ICD-10-CM | POA: Diagnosis not present

## 2023-01-06 DIAGNOSIS — Z7982 Long term (current) use of aspirin: Secondary | ICD-10-CM | POA: Diagnosis not present

## 2023-01-06 DIAGNOSIS — S81802A Unspecified open wound, left lower leg, initial encounter: Secondary | ICD-10-CM | POA: Diagnosis not present

## 2023-01-06 DIAGNOSIS — X58XXXA Exposure to other specified factors, initial encounter: Secondary | ICD-10-CM | POA: Insufficient documentation

## 2023-01-06 DIAGNOSIS — T148XXA Other injury of unspecified body region, initial encounter: Secondary | ICD-10-CM

## 2023-01-06 LAB — CBC WITH DIFFERENTIAL/PLATELET
Abs Immature Granulocytes: 0.02 10*3/uL (ref 0.00–0.07)
Basophils Absolute: 0 10*3/uL (ref 0.0–0.1)
Basophils Relative: 0 %
Eosinophils Absolute: 0.1 10*3/uL (ref 0.0–0.5)
Eosinophils Relative: 1 %
HCT: 34.5 % — ABNORMAL LOW (ref 36.0–46.0)
Hemoglobin: 10.7 g/dL — ABNORMAL LOW (ref 12.0–15.0)
Immature Granulocytes: 0 %
Lymphocytes Relative: 22 %
Lymphs Abs: 1.8 10*3/uL (ref 0.7–4.0)
MCH: 25.9 pg — ABNORMAL LOW (ref 26.0–34.0)
MCHC: 31 g/dL (ref 30.0–36.0)
MCV: 83.5 fL (ref 80.0–100.0)
Monocytes Absolute: 0.7 10*3/uL (ref 0.1–1.0)
Monocytes Relative: 8 %
Neutro Abs: 5.6 10*3/uL (ref 1.7–7.7)
Neutrophils Relative %: 69 %
Platelets: 301 10*3/uL (ref 150–400)
RBC: 4.13 MIL/uL (ref 3.87–5.11)
RDW: 15.3 % (ref 11.5–15.5)
WBC: 8.2 10*3/uL (ref 4.0–10.5)
nRBC: 0 % (ref 0.0–0.2)

## 2023-01-06 LAB — BASIC METABOLIC PANEL
Anion gap: 8 (ref 5–15)
BUN: 27 mg/dL — ABNORMAL HIGH (ref 8–23)
CO2: 23 mmol/L (ref 22–32)
Calcium: 9.6 mg/dL (ref 8.9–10.3)
Chloride: 103 mmol/L (ref 98–111)
Creatinine, Ser: 0.78 mg/dL (ref 0.44–1.00)
GFR, Estimated: >60 mL/min (ref >60)
Glucose, Bld: 96 mg/dL (ref 70–99)
Potassium: 3.7 mmol/L (ref 3.5–5.1)
Sodium: 134 mmol/L — ABNORMAL LOW (ref 135–145)

## 2023-01-06 LAB — URINALYSIS, ROUTINE W REFLEX MICROSCOPIC
Bacteria, UA: NONE SEEN
Bilirubin Urine: NEGATIVE
Glucose, UA: NEGATIVE mg/dL
Hgb urine dipstick: NEGATIVE
Ketones, ur: NEGATIVE mg/dL
Leukocytes,Ua: NEGATIVE
Nitrite: NEGATIVE
Protein, ur: NEGATIVE mg/dL
Specific Gravity, Urine: 1.034 — ABNORMAL HIGH (ref 1.005–1.030)
pH: 5 (ref 5.0–8.0)

## 2023-01-06 LAB — CBG MONITORING, ED: Glucose-Capillary: 75 mg/dL (ref 70–99)

## 2023-01-06 LAB — LACTIC ACID, PLASMA: Lactic Acid, Venous: 0.9 mmol/L (ref 0.5–1.9)

## 2023-01-06 MED ORDER — OXYCODONE-ACETAMINOPHEN 10-325 MG PO TABS: 1.0000 | ORAL_TABLET | Freq: Three times a day (TID) | ORAL | Status: DC | PRN

## 2023-01-06 MED ORDER — OXYCODONE HCL 5 MG PO TABS
5.0000 mg | ORAL_TABLET | Freq: Three times a day (TID) | ORAL | Status: DC | PRN
  Administered 2023-01-08 – 2023-01-09 (×5): 5 mg via ORAL
  Filled 2023-01-06 (×5): qty 1

## 2023-01-06 MED ORDER — FUROSEMIDE 40 MG PO TABS: 40.0000 mg | ORAL_TABLET | Freq: Every day | ORAL | Status: DC | PRN

## 2023-01-06 MED ORDER — HYDROMORPHONE HCL 1 MG/ML IJ SOLN
0.5000 mg | Freq: Once | INTRAMUSCULAR | Status: AC
  Administered 2023-01-06: 0.5 mg via INTRAVENOUS
  Filled 2023-01-06: qty 1

## 2023-01-06 MED ORDER — IOHEXOL 350 MG/ML SOLN
100.0000 mL | Freq: Once | INTRAVENOUS | Status: AC | PRN
  Administered 2023-01-06: 100 mL via INTRAVENOUS

## 2023-01-06 MED ORDER — ALBUTEROL SULFATE HFA 108 (90 BASE) MCG/ACT IN AERS: 1.0000 | INHALATION_SPRAY | Freq: Four times a day (QID) | RESPIRATORY_TRACT | Status: DC | PRN

## 2023-01-06 MED ORDER — CYCLOBENZAPRINE HCL 10 MG PO TABS
10.0000 mg | ORAL_TABLET | Freq: Three times a day (TID) | ORAL | Status: DC | PRN
  Administered 2023-01-08 (×2): 10 mg via ORAL
  Filled 2023-01-06 (×2): qty 1

## 2023-01-06 MED ORDER — LINACLOTIDE 72 MCG PO CAPS
72.0000 ug | ORAL_CAPSULE | Freq: Every day | ORAL | Status: DC
  Administered 2023-01-07 – 2023-01-10 (×4): 72 ug via ORAL
  Filled 2023-01-06 (×4): qty 1

## 2023-01-06 MED ORDER — ROSUVASTATIN CALCIUM 20 MG PO TABS
20.0000 mg | ORAL_TABLET | Freq: Every day | ORAL | Status: DC
  Administered 2023-01-06 – 2023-01-09 (×4): 20 mg via ORAL
  Filled 2023-01-06 (×4): qty 1

## 2023-01-06 MED ORDER — ASPIRIN 81 MG PO TBEC
81.0000 mg | DELAYED_RELEASE_TABLET | Freq: Every day | ORAL | Status: DC
  Administered 2023-01-06 – 2023-01-10 (×5): 81 mg via ORAL
  Filled 2023-01-06 (×5): qty 1

## 2023-01-06 MED ORDER — PENTOXIFYLLINE ER 400 MG PO TBCR
400.0000 mg | EXTENDED_RELEASE_TABLET | Freq: Two times a day (BID) | ORAL | Status: DC
  Administered 2023-01-08 – 2023-01-10 (×5): 400 mg via ORAL
  Filled 2023-01-06 (×8): qty 1

## 2023-01-06 MED ORDER — VITAMIN C 500 MG PO TABS
500.0000 mg | ORAL_TABLET | Freq: Every day | ORAL | Status: DC
  Administered 2023-01-06 – 2023-01-10 (×5): 500 mg via ORAL
  Filled 2023-01-06 (×6): qty 1

## 2023-01-06 MED ORDER — ALBUTEROL SULFATE (2.5 MG/3ML) 0.083% IN NEBU: 2.5000 mg | INHALATION_SOLUTION | Freq: Four times a day (QID) | RESPIRATORY_TRACT | Status: DC | PRN

## 2023-01-06 MED ORDER — MORPHINE SULFATE (PF) 4 MG/ML IV SOLN
4.0000 mg | Freq: Once | INTRAVENOUS | Status: AC
  Administered 2023-01-06: 4 mg via INTRAVENOUS
  Filled 2023-01-06: qty 1

## 2023-01-06 MED ORDER — GABAPENTIN 100 MG PO CAPS
100.0000 mg | ORAL_CAPSULE | Freq: Two times a day (BID) | ORAL | Status: DC
  Administered 2023-01-06 – 2023-01-10 (×8): 100 mg via ORAL
  Filled 2023-01-06 (×8): qty 1

## 2023-01-06 MED ORDER — METFORMIN HCL 500 MG PO TABS
500.0000 mg | ORAL_TABLET | Freq: Two times a day (BID) | ORAL | Status: DC
  Administered 2023-01-09 – 2023-01-10 (×3): 500 mg via ORAL
  Filled 2023-01-06 (×3): qty 1

## 2023-01-06 MED ORDER — ADULT MULTIVITAMIN W/MINERALS CH
1.0000 | ORAL_TABLET | Freq: Every day | ORAL | Status: DC
  Administered 2023-01-07 – 2023-01-10 (×4): 1 via ORAL
  Filled 2023-01-06 (×4): qty 1

## 2023-01-06 MED ORDER — ONDANSETRON HCL 4 MG/2ML IJ SOLN
4.0000 mg | Freq: Once | INTRAMUSCULAR | Status: AC
  Administered 2023-01-06: 4 mg via INTRAVENOUS
  Filled 2023-01-06: qty 2

## 2023-01-06 MED ORDER — OXYCODONE-ACETAMINOPHEN 5-325 MG PO TABS
1.0000 | ORAL_TABLET | Freq: Three times a day (TID) | ORAL | Status: DC | PRN
  Administered 2023-01-07 – 2023-01-10 (×6): 1 via ORAL
  Filled 2023-01-06 (×6): qty 1

## 2023-01-06 MED ORDER — METFORMIN HCL 500 MG PO TABS: 500.0000 mg | ORAL_TABLET | Freq: Two times a day (BID) | ORAL | Status: DC

## 2023-01-06 NOTE — ED Notes (Signed)
Attempted to get pt up to the bathroom. Pt could not bear weight on legs. Pt was rearful and c/o severe pain.

## 2023-01-06 NOTE — Progress Notes (Signed)
Right lower extremity venous duplex has been completed. Preliminary results can be found in CV Proc through chart review.  Results were given to Dorise Bullion PA.  01/06/23 2:29 PM Carlos Levering RVT

## 2023-01-06 NOTE — ED Notes (Signed)
I attempted to collect labs twice and was unsuccessful.  I made RN aware.

## 2023-01-06 NOTE — ED Notes (Signed)
I sent down blue blood culture.

## 2023-01-06 NOTE — ED Triage Notes (Signed)
Pt arrived via EMS, from home. Wounds on bilateral legs, pain worsening. States hx of DVTs and cellulitis, states feels similar.

## 2023-01-06 NOTE — Progress Notes (Signed)
PASRR # [id number]

## 2023-01-06 NOTE — ED Provider Triage Note (Signed)
Emergency Medicine Provider Triage Evaluation Note  Diana Sanchez , a 67 y.o. female  was evaluated in triage.  Pt complains of worsening leg pain/wounds.  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  Chronic leg wound of the left leg continues to be painful, no new changes.  However new developing wound of the right lower leg started about a month ago.  Has a home health nurse that comes out changes her bandage twice a week.  Also now noticing warmth and pain of the right hip, which has a total joint replacement.  Reports subjective fevers and chills.  Hx of lupus, polymyalgia rheumatica, chronic pain syndrome, prior DVT, prior MRSA infection.  Review of Systems  Positive:  Negative: See above  Physical Exam  BP (!) 151/83   Pulse (!) 112   Temp 98.7 F (37.1 C) (Oral)   Resp 19   SpO2 99%  Gen:   Awake, no distress   Resp:  Normal effort  MSK:   Moves extremities without difficulty  Other:  Appears uncomfortable. Both legs wrapped in bandages.  Right lower leg wound inspected, tender to touch, without active drainage.  Mild tenderness and some warmth of right hip, none of right knee, difficult to discern swelling due to body habitus.  Medical Decision Making  Medically screening exam initiated at 1:19 PM.  Appropriate orders placed.  E[place]be was informed that the remainder of the evaluation will be completed by another provider, this initial triage assessment does not replace that evaluation, and the importance of remaining in the ED until their evaluation is complete.  Labs and imaging ordered.   Prince Rome, PA-C 30/16/01 1328

## 2023-01-06 NOTE — Progress Notes (Signed)
Transition of Care [facility]) - Emergency Department Mini Assessment   Patient Details  Name: Diana Sanchez MRN: [id number] Date of Birth: [birth year]  [facility] CM/SW Contact:    Rodney Booze, LCSW Phone Number: 01/06/2023, 8:15 PM   Clinical Narrative: CSW spoke to the son as well the patient. The family would like the mom to go to a SNF to get her strength back.   The patient also gave this CSW verbal permission to speak with her son about any updates. The son would like TOC to keep him posted through this process. CSW touched briefly on how the process would work.  CSW explained to the son and the patient that we will update on progress as it comes to [facility]. CSW has started the process received the patient's PASRR and made sure that the PT orders were in. TOC will continue to follow at this time.    ED Mini Assessment: What brought you to the Emergency Department? : (P) Patient has wounds and in is much pain  Barriers to Discharge: (P)  (Patient has to be seen by PT awaiting eval)  Barrier interventions: (P) Patient needs SNF awaiting EVAL     Interventions which prevented an admission or readmission: SNF Placement    Patient Contact and Communications     Spoke with: (P) Kyan Contact Date: (P) 01/06/23,   Contact time: (P) 2014      Patient states their goals for this hospitalization and ongoing recovery are:: (P) The patient wants to get better.      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 03/30/2022   DNR (do not resuscitate)/DNI(Do Not Intubate) 03/30/2022   Leukocytosis 03/30/2022   Lymphedema, not elsewhere classified 12/13/2021   Long term (current) use of oral hypoglycemic drugs 12/13/2021    Bilateral primary osteoarthritis of knee 12/13/2021   Iron deficiency anemia 11/30/2021   Palpitations 08/27/2021   Constipation 05/09/2020   Intrinsic eczema 05/09/2020   Memory changes 05/09/2020   Hypercholesterolemia 03/23/2020   Prediabetes 03/23/2020   Lower abdominal pain 08/29/2019   Left leg swelling 08/27/2019   Abnormal mammogram 08/24/2019   Skin ulcer of left ankle with fat layer exposed ([place]) 07/03/2019   Venous insufficiency 02/20/2019   Venous stasis dermatitis of both lower extremities 02/20/2019   Venous stasis ulcer with varicose veins ([place]) 02/20/2019   Depression 01/26/2019   GERD (gastroesophageal reflux disease) 01/26/2019   Venous stasis ulcer of left lower leg with edema of left lower leg ([place]) 01/10/2019   Bilateral hearing loss 08/01/2018   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   Personal history of MRSA (methicillin resistant Staphylococcus aureus) 06/13/2018   Migraine without status migrainosus, not intractable 04/13/2017   DDD (degenerative disc disease), lumbar 10/19/2016   Lumbar facet arthropathy 10/19/2016   Osteoarthritis of both knees 10/19/2016   Polyarthropathy, multiple sites 10/19/2016   Chronic pain syndrome 10/19/2016   Cataract of both eyes 07/30/2016   Anemia of chronic disease 09/04/2014   History of DVT (deep vein thrombosis) 08/12/2014   Wound cellulitis 08/12/2014   S/P revision of total knee, 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

## 2023-01-06 NOTE — ED Provider Notes (Signed)
[place] EMERGENCY DEPARTMENT AT [facility] Provider Note   CSN: [id number] Arrival date & time: 01/06/23  1225     History  No chief complaint on file.   Diana Sanchez is a 67 y.o. female.  Pt is a 67 yo female with a pmhx significant for DVTs, arthritis, HLD, HTN, and chronic wounds in lower legs.  Pt does go to wound care at [facility] and significant progress has been made on her wounds.  Pt woke up today and and had severe pain in her legs.  Pt said she has not been able to walk due to the pain.  Pt took her percocet in the waiting room and pain has improved, but it is still there.         Home Medications Prior to Admission medications   Medication Sig Start Date End Date Taking? Authorizing Provider  albuterol (PROVENTIL HFA;VENTOLIN HFA) 108 (90 Base) MCG/ACT inhaler Inhale 1-2 puffs into the lungs every 6 (six) hours as needed for wheezing or shortness of breath. 02/14/12   [provider]  Alcohol Swabs (ALCOHOL WIPES) 70 % PADS by Does not apply route. 04/04/20   [provider]  amoxicillin-clavulanate (AUGMENTIN) 875-125 MG tablet Take 1 tablet by mouth every 12 (twelve) hours. 09/07/22   Linwood Dibbles, MD  Ascorbic Acid (VITAMIN C PO) Take 1 tablet by mouth daily.    [provider]  aspirin EC 81 MG tablet Take 81 mg by mouth daily.    [provider]  Blood Glucose Monitoring Suppl (ACCU-CHEK GUIDE) w/Device KIT by Does not apply route. 04/04/20   [provider]  clobetasol ointment (TEMOVATE) 0.05 % Apply 1 application. topically every other day. Patient not taking: Reported on 09/07/2022 03/09/22   [provider]  [employer] ACETAMINOPHEN EX ST 500 MG tablet Take 500 mg by mouth 2 (two) times daily as needed for moderate pain. 03/06/22   [provider]  cyclobenzaprine (FLEXERIL) 10 MG tablet Take 10 mg by mouth 3 (three) times daily as needed for muscle spasms.    [provider]   doxycycline (VIBRAMYCIN) 100 MG capsule Take 1 capsule by mouth 2 (two) times daily. 09/02/22   [provider]  furosemide (LASIX) 40 MG tablet Take 1 tablet (40 mg total) by mouth daily as needed for fluid or edema. 07/02/22 07/02/23  Almon Hercules, MD  gabapentin (NEURONTIN) 100 MG capsule Take 100 mg by mouth 2 (two) times daily. 01/15/22   [provider]  lidocaine (XYLOCAINE) 5 % ointment Apply 1 Application topically 2 (two) times daily as needed for mild pain. 09/03/22   [provider]  LINZESS 72 MCG capsule Take 72 mcg by mouth daily before breakfast. 01/12/19   [provider]  metFORMIN (GLUCOPHAGE) 500 MG tablet Take 500 mg by mouth 2 (two) times daily. 06/17/22   [provider]  Multiple Vitamin (MULTIVITAMIN) tablet Take 1 tablet by mouth daily.    [provider]  mycophenolate (CELLCEPT) 500 MG tablet Take 1,000 mg by mouth 2 (two) times daily. Patient not taking: Reported on 09/07/2022 06/10/22   [provider]  NARCAN 4 MG/0.1ML LIQD nasal spray kit Place 1 spray into the nose daily as needed (For overdose). 08/09/18   [provider]  oxyCODONE-acetaminophen (PERCOCET) 10-325 MG tablet Take 1 tablet by mouth every 8 (eight) hours as needed for pain. for pain 03/16/22   [provider]  pentoxifylline (TRENTAL) 400 MG CR  tablet Take 400 mg by mouth 2 (two) times daily. 07/30/22   [provider]  predniSONE (DELTASONE) 20 MG tablet Take 40 mg by mouth daily. Patient not taking: Reported on 09/07/2022 03/09/22   [provider]  rizatriptan (MAXALT) 5 MG tablet Take 10 mg by mouth daily as needed for migraine.    [provider]  rosuvastatin (CRESTOR) 20 MG tablet Take 20 mg by mouth at bedtime. 10/07/21   [provider]  triamcinolone cream (KENALOG) 0.1 % Apply 1 application. topically 2 (two) times daily as needed (for itching). 11/19/21   [provider]       Allergies    Tramadol    Review of Systems   Review of Systems  Musculoskeletal:        BLE pain  Skin:  Positive for wound.  All other systems reviewed and are negative.   Physical Exam Updated Vital Signs BP (!) 141/81   Pulse 100   Temp 98 F (36.7 C) (Oral)   Resp 20   SpO2 98%  Physical Exam Vitals and nursing note reviewed.  Constitutional:      Appearance: Normal appearance.  HENT:     Head: Normocephalic and atraumatic.     Right Ear: External ear normal.     Left Ear: External ear normal.     Nose: Nose normal.     Mouth/Throat:     Mouth: Mucous membranes are moist.     Pharynx: Oropharynx is clear.  Eyes:     Extraocular Movements: Extraocular movements intact.     Conjunctiva/sclera: Conjunctivae normal.     Pupils: Pupils are equal, round, and reactive to light.  Cardiovascular:     Rate and Rhythm: Normal rate and regular rhythm.     Heart sounds: Normal heart sounds.  Pulmonary:     Effort: Pulmonary effort is normal.     Breath sounds: Normal breath sounds.  Abdominal:     General: Abdomen is flat. Bowel sounds are normal.     Palpations: Abdomen is soft.  Musculoskeletal:        General: Normal range of motion.     Cervical back: Normal range of motion and neck supple.  Skin:    General: Skin is warm.     Capillary Refill: Capillary refill takes less than 2 seconds.     Comments: Wounds to BLE; see pictures; no evidence of cellulitis or swelling.  Dressing to left leg was extremely tight and difficult to cut off.  Pt reports wounds look good.  Neurological:     General: No focal deficit present.     Mental Status: She is alert and oriented to person, place, and time.  Psychiatric:        Mood and Affect: Mood normal.        Behavior: Behavior normal.        ED Results / Procedures / Treatments   Labs (all labs ordered are listed, but only abnormal results are displayed) Labs Reviewed  BASIC METABOLIC PANEL - Abnormal; Notable  for the following components:      Result Value   Sodium 134 (*)    BUN 27 (*)    All other components within normal limits  CBC WITH DIFFERENTIAL/PLATELET - Abnormal; Notable for the following components:   Hemoglobin 10.7 (*)    HCT 34.5 (*)    MCH 25.9 (*)    All other components within normal limits  CULTURE, BLOOD (ROUTINE X 2)  LACTIC ACID, PLASMA  LACTIC ACID, PLASMA  URINALYSIS, ROUTINE W REFLEX MICROSCOPIC    EKG None  Radiology CT Angio Aortobifemoral W and/or Wo Contrast  Result Date: 01/06/2023 CLINICAL DATA:  Claudication.  Worsening bilateral leg wounds. EXAM: CT ANGIOGRAPHY AOBIFEM WITHOUT AND WITH CONTRAST TECHNIQUE: After the uneventful administration of IV contrast, thin section CT images were obtained in the arterial phase from the lung bases through the lower extremities. Multiplanar and three-dimensional reformatted images were generated to better define the arterial anatomy. CONTRAST:  OMNIPAQUE IOHEXOL 350 MG/ML SOLN COMPARISON:  None Available. FINDINGS: The abdominal aorta has some scattered atherosclerotic calcified plaque without dissection or aneurysm formation. Patent origin to the celiac axis. Standard anatomy. The SMA origin is patent. Mild atherosclerotic disease. Single bilateral main renal arteries without significant ostial stenosis. The inferior mesenteric artery is patent. There is some mild atherosclerotic calcified plaque along the common iliac arteries without significant stenosis. Mild plaque as well which is calcified along the internal and external iliac arteries. No significant external iliac artery stenosis. The left lower extremity demonstrates some mild eccentric calcified plaque at the common femoral artery. Deep femoral artery is grossly patent. There is variant with 2 separate origins of the branches of the deep femoral artery 1 medial and 1 posterolateral. Proximal portions of these vessels are intact. The SFA has some mild  atherosclerotic calcified plaque. No significant stenosis. Popliteal artery is preserved. Portions of the popliteal artery obscured by the extensive streak artifact from the patient's bilateral knee arthroplasties. What is seen is preserved. The trifurcation has some mild atherosclerotic disease. There is a three-vessel runoff noted to the ankle. Dominant vessel appears to be the posterior tibial artery. Right common femoral artery has some mild eccentric calcified plaque. Just as on the left there are 2 separate origins to branches of the deep femoral artery which are grossly patent. The right SFA has some mild eccentric calcified plaque without significant stenosis. Popliteal artery has plaque and calcification. Again portions are obscured by the streak artifact from the arthroplasty. Trifurcation has a three-vessel runoff noted to the ankle. The anterior tibial in the posteriorly are codominant. Of note there is early venous return in the right distal lower leg compared to left. Please correlate with an inflammatory process. Note is made of some skin thickening and subcutaneous fat stranding along both distal legs. The skin thickening on the left appears more than the right particularly towards the ankle where there may be some skin irregularities. There are also significant scattered subcutaneous fat calcifications bilaterally. Elsewhere, there is some breathing motion lung bases with some mild atelectasis. No pleural effusion. With the limits of the early phase of the contrast bolus, grossly the liver, pancreas, spleen, adrenal glands are unremarkable. Kidneys are mildly atrophic without obvious mass lesion. Gallbladder contains dependent stones. Preserved contours of the urinary bladder.  The uterus is present. The large bowel on this non oral contrast exam has a normal course and caliber with scattered stool. Overall moderate stool burden. Normal appendix extends medial and posterior to the cecum in the right  hemipelvis. The stomach and small bowel are nondilated. Preserved grossly central venous system of the abdomen and pelvis. There are some enlarged lymph nodes identified along the pelvic sidewalls bilaterally. Such as left on series 5, image 108 measuring 18 by 12 mm. Right side external iliac chain on image 111 measuring 2.9 by 1.9 cm. There are abnormal nodes extending proximal to the level of the aortic bifurcation. Prominent inguinal  nodes as well. Some smaller upper retroperitoneal nodes as well. These are nonspecific. Based on the findings of the legs these could be reactive. Please correlate for any known history or follow up is recommended. Small fat containing umbilical hernia.  No ascites. Significant streak artifact related to the patient's bilateral hip arthroplasties. Scattered degenerative changes noted along the lumbar spine. Osteopenia. Multiple areas of muscle atrophy along the lower extremities. Again bilateral knee arthroplasties are identified. Skin thickening identified along the distal lower extremities, left-greater-than-right extending out to the feet. Stranding. Review of the MIP images confirms the above findings. IMPRESSION: Relatively mild atherosclerotic disease diffusely. No significant stenosis or vessel occlusion of the aorta or iliac vessels. No significant inflow disease. Outflow has scattered mild atherosclerotic calcified plaque with a three-vessel runoff noted to the ankles. Of note however portions of the popliteal arteries are poorly seen related to the dense streak artifact from the patient's bilateral knee arthroplasties. There is significant skin thickening identified along the distal lower extremities with subcutaneous fat stranding and areas of skin irregularity particularly of the left lower extremity. Please correlate for soft tissue abnormality. There are several enlarged pelvic and lower retroperitoneal nodes. Although in principle these have a differential these could  be reactive with the findings of the lower extremities. Please correlate for any known history. Additional evaluation or follow-up as clinically directed. Fatty liver infiltration. Gallstones. Bilateral hip arthroplasties. Electronically Signed   By: Jill Side M.D.   On: 01/06/2023 18:46   VAS [place] LOWER EXTREMITY VENOUS (DVT) (7a-7p)  Result Date: 01/06/2023  Lower Venous DVT Study Patient Name:  MARGIA WIESEN [facility]  Date of Exam:   01/06/2023 Medical Rec #: [id number]                   Accession #:    [id number] Date of Birth: 1956-09-19                   Patient Gender: F Patient Age:   34 years Exam Location:  [facility] Procedure:      VAS [place] LOWER EXTREMITY VENOUS (DVT) Referring Phys: Dorise Bullion --------------------------------------------------------------------------------  Indications: Pain.  Risk Factors: None identified. Limitations: Poor ultrasound/tissue interface, bandages and open wound. Comparison Study: No prior studies. Performing Technologist: Oliver Hum RVT  Examination Guidelines: A complete evaluation includes B-mode imaging, spectral Doppler, color Doppler, and power Doppler as needed of all accessible portions of each vessel. Bilateral testing is considered an integral part of a complete examination. Limited examinations for reoccurring indications may be performed as noted. The reflux portion of the exam is performed with the patient in reverse Trendelenburg.  +---------+---------------+---------+-----------+----------+--------------+ RIGHT    CompressibilityPhasicitySpontaneityPropertiesThrombus Aging +---------+---------------+---------+-----------+----------+--------------+ CFV      Full           Yes      Yes                                 +---------+---------------+---------+-----------+----------+--------------+ SFJ      Full                                                         +---------+---------------+---------+-----------+----------+--------------+ FV Prox  Full                                                        +---------+---------------+---------+-----------+----------+--------------+  FV Mid   Full                                                        +---------+---------------+---------+-----------+----------+--------------+ FV DistalFull           Yes      Yes                                 +---------+---------------+---------+-----------+----------+--------------+ PFV      Full                                                        +---------+---------------+---------+-----------+----------+--------------+ POP      Full           Yes      Yes                                 +---------+---------------+---------+-----------+----------+--------------+ PTV      Full                                                        +---------+---------------+---------+-----------+----------+--------------+ PERO     Full                                                        +---------+---------------+---------+-----------+----------+--------------+   +----+---------------+---------+-----------+----------+--------------+ LEFTCompressibilityPhasicitySpontaneityPropertiesThrombus Aging +----+---------------+---------+-----------+----------+--------------+ CFV Full           Yes      Yes                                 +----+---------------+---------+-----------+----------+--------------+    Summary: RIGHT: - There is no evidence of deep vein thrombosis in the lower extremity. However, portions of this examination were limited- see technologist comments above.  - No cystic structure found in the popliteal fossa.  LEFT: - No evidence of common femoral vein obstruction.  *See table(s) above for measurements and observations. Electronically signed by Sherald Hess MD on 01/06/2023 at 4:52:25 PM.    Final    DG Ankle Complete  Right  Result Date: 01/06/2023 CLINICAL DATA:  Chronic hip pain. Previous bilateral total hip arthroplasty. Lower leg wounds. EXAM: RIGHT ANKLE - COMPLETE 3+ VIEW; DG HIP (WITH OR WITHOUT PELVIS) 2-3V RIGHT COMPARISON:  Limited correlation made with left femur radiographs 09/03/2022. FINDINGS: Right hip: AP pelvis with AP and frog-leg lateral views of the right hip. Status post bilateral total hip arthroplasty. On the right, there are intact cerclage wires. The distal end of the right femoral prosthesis is not imaged. There is no evidence of hardware loosening, acute fracture or dislocation. Mild sacroiliac degenerative changes are present bilaterally. Right ankle: The  bones are demineralized. No evidence of acute fracture, dislocation or osteomyelitis. The subtalar joint appears poorly defined on the lateral view, suspicious for underlying subtalar coalition. There are midfoot degenerative changes. Diffuse soft tissue calcifications are noted within the distal lower leg. No evidence of foreign body or soft tissue emphysema. IMPRESSION: 1. No acute osseous findings or evidence of osteomyelitis in the right hip or ankle. 2. 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 Electronically Signed   By: Carey BullocksWilliam  Veazey M.D.   On: 01/06/2023 14:12   DG Hip Unilat With Pelvis 2-3 Views Right  Result Date: 01/06/2023 CLINICAL DATA:  Chronic hip pain. Previous bilateral total hip arthroplasty. Lower leg wounds. EXAM: RIGHT ANKLE - COMPLETE 3+ VIEW; DG HIP (WITH OR WITHOUT PELVIS) 2-3V RIGHT COMPARISON:  Limited correlation made with left femur radiographs 09/03/2022. FINDINGS: Right hip: AP pelvis with AP and frog-leg lateral views of the right hip. Status post bilateral total hip arthroplasty. On the right, there are intact cerclage wires. The distal end of the right femoral prosthesis is not imaged. There is no evidence of hardware loosening, acute  fracture or dislocation. Mild sacroiliac degenerative changes are present bilaterally. Right ankle: The bones are demineralized. No evidence of acute fracture, dislocation or osteomyelitis. The subtalar joint appears poorly defined on the lateral view, suspicious for underlying subtalar coalition. There are midfoot degenerative changes. Diffuse soft tissue calcifications are noted within the distal lower leg. No evidence of foreign body or soft tissue emphysema. IMPRESSION: 1. No acute osseous findings or evidence of osteomyelitis in the right hip or ankle. 2. 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 Electronically Signed   By: Carey BullocksWilliam  Veazey M.D.   On: 01/06/2023 14:12   DG Ankle Complete Left  Result Date: 01/06/2023 CLINICAL DATA:  Pain EXAM: LEFT ANKLE COMPLETE - 3 VIEW COMPARISON:  09/03/2022 FINDINGS: Evaluation for bone detail obscured by overlying artifact. No gross bony destructive process. No acute traumatic abnormalities are seen. IMPRESSION: Negative limited evaluation. Electronically Signed   By: Layla MawJoshua  Pleasure M.D.   On: 01/06/2023 14:09    Procedures Procedures    Medications Ordered in ED Medications  HYDROmorphone (DILAUDID) injection 0.5 mg (has no administration in time range)  albuterol (VENTOLIN HFA) 108 (90 Base) MCG/ACT inhaler 1-2 puff (has no administration in time range)  ascorbic acid (VITAMIN C) tablet 500 mg (has no administration in time range)  aspirin EC tablet 81 mg (has no administration in time range)  cyclobenzaprine (FLEXERIL) tablet 10 mg (has no administration in time range)  furosemide (LASIX) tablet 40 mg (has no administration in time range)  gabapentin (NEURONTIN) capsule 100 mg (has no administration in time range)  linaclotide (LINZESS) capsule 72 mcg (has no administration in time range)  metFORMIN (GLUCOPHAGE) tablet 500 mg (has no administration in time range)  multivitamin  tablet 1 tablet (has no administration in time range)  oxyCODONE-acetaminophen (PERCOCET) 10-325 MG per tablet 1 tablet (has no administration in time range)  pentoxifylline (TRENTAL) CR tablet 400 mg (has no administration in time range)  rosuvastatin (CRESTOR) tablet 20 mg (has no administration in time range)  morphine (PF) 4 MG/ML injection 4 mg (4 mg Intravenous Given 01/06/23 1711)  ondansetron (ZOFRAN) injection 4 mg (4 mg Intravenous Given 01/06/23 1711)  iohexol (OMNIPAQUE) 350 MG/ML injection 100 mL (100 mLs Intravenous Contrast Given 01/06/23 1801)  morphine (PF) 4 MG/ML injection 4 mg (4 mg Intravenous Given  by Other 01/06/23 Portia.Staggers)    ED Course/ Medical Decision Making/ A&P                             Medical Decision Making Amount and/or Complexity of Data Reviewed Labs: ordered. Radiology: ordered.  Risk OTC drugs. Prescription drug management.   This patient presents to the ED for concern of leg pain, this involves an extensive number of treatment options, and is a complaint that carries with it a high risk of complications and morbidity.  The differential diagnosis includes dvt, cellulitis, pad, fx   Co morbidities that complicate the patient evaluation  DVTs, arthritis, HLD, HTN, and chronic wounds in lower legs   Additional history obtained:  Additional history obtained from epic chart review    Lab Tests:  I Ordered, and personally interpreted labs.  The pertinent results include:  cbc with hgb 10.7 (hgb 11.8 4 months ago, but she has been in the 9s about 9 mo ago); bmp nl; lactic nl   Imaging Studies ordered:  I ordered imaging studies including DVT US and CTA and xray right hip and right ankle  I independently visualized and interpreted imaging which showed  [place]: No DVT L ankle: Negative limited evaluation.  R hip and ankle: 1. No acute osseous findings or evidence of osteomyelitis in the  right hip or ankle.  2. Status post bilateral total hip  arthroplasty without demonstrated  complication.  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  CT Relatively mild atherosclerotic disease diffusely. No significant  stenosis or vessel occlusion of the aorta or iliac vessels. No  significant inflow disease.    Outflow has scattered mild atherosclerotic calcified plaque with a  three-vessel runoff noted to the ankles. Of note however portions of  the popliteal arteries are poorly seen related to the dense streak  artifact from the patient's bilateral knee arthroplasties.    There is significant skin thickening identified along the distal  lower extremities with subcutaneous fat stranding and areas of skin  irregularity particularly of the left lower extremity. Please  correlate for soft tissue abnormality.    There are several enlarged pelvic and lower retroperitoneal nodes.  Although in principle these have a differential these could be  reactive with the findings of the lower extremities. Please  correlate for any known history. Additional evaluation or follow-up  as clinically directed.    Fatty liver infiltration.    Gallstones.    Bilateral hip arthroplasties.   I agree with the radiologist interpretation   Cardiac Monitoring:  The patient was maintained on a cardiac monitor.  I personally viewed and interpreted the cardiac monitored which showed an underlying rhythm of: nsr   Medicines ordered and prescription drug management:  I ordered medication including morphine and zofran  for pain  Reevaluation of the patient after these medicines showed that the patient improved I have reviewed the patients home medicines and have made adjustments as needed   Test Considered:  [place], ct   Critical Interventions:  Pain control   Problem List / ED Course:  BLE Pain:  this is likely chronic.  Pt has no dvt, cellulitis, or arterial occlusion.  She reports her wounds looks good.  I don't see any purulent drainage.  No cellulitis.  Pt  is still c/o pain to her legs and said she can't put any weight on them.  Pt said her home health nurse told her that she needs to go  to rehab as this pain has been going on for awhile.  I don't have any indication for admission, so I will put her in TOC boarder status.  I will consult PT for eval and wound care for wound recommendations while here.  Reevaluation:  After the interventions noted above, I reevaluated the patient and found that they have :improved   Social Determinants of Health:  Lives at home alone   Dispostion:  After consideration of the diagnostic results and the patients response to treatment, I feel that the patent would benefit from rehab       Final Clinical Impression(s) / ED Diagnoses Final diagnoses:  Bilateral leg pain  Chronic wound    Rx / DC Orders ED Discharge Orders     None         Isla Pence, MD 01/06/23 [phone]

## 2023-01-07 DIAGNOSIS — S81801A Unspecified open wound, right lower leg, initial encounter: Secondary | ICD-10-CM | POA: Diagnosis not present

## 2023-01-07 NOTE — Evaluation (Signed)
Physical Therapy Evaluation Patient Details Name: Diana Sanchez MRN: [id number] DOB: 1956/02/25 Today's Date: 01/07/2023  History of Present Illness  Pt is 67 yo female presented on 01/06/23 with inability to walk due to leg pain.  Pt with bil LE wounds and goes to wound care at [facility].  Pt with hx including DVTs, arthritis with bil hip and knee replacements (pt reports complications requiring multiple surgeries), HLD, HTN, and chronic leg wounds.  Clinical Impression  Pt admitted with above diagnosis. At baseline pt resides alone and is independent.  She reports for the last year she has had increased difficulty due to LE wounds with son driving from [place] to assist and check on her frequently.  She reports significant decline in her health and mobility after her total joint replacements due to multiple complications.  Today, pt requiring min-mod A for transfers and to ambulate 64' with RW.  She demonstrated poor balance and very slow gait speed indicative of fall risk. She does report pain has decreased since admission.  Due decreased mobility, functional limitation, and lives alone recommend SNF at d/c.  Pt currently with functional limitations due to the deficits listed below (see PT Problem List). Pt will benefit from skilled PT to increase their independence and safety with mobility to allow discharge to the venue listed below.          Recommendations for follow up therapy are one component of a multi-disciplinary discharge planning process, led by the attending physician.  Recommendations may be updated based on patient status, additional functional criteria and insurance authorization.  Follow Up Recommendations Skilled nursing-short term rehab (<3 hours/day) Can patient physically be transported by private vehicle: Yes    Assistance Recommended at Discharge Frequent or constant Supervision/Assistance  Patient can return home with the following  A lot of help with walking  and/or transfers;A lot of help with bathing/dressing/bathroom;Assistance with cooking/housework;Help with stairs or ramp for entrance    Equipment Recommendations Rolling walker (2 wheels);BSC/3in1  Recommendations for Other Services       Functional Status Assessment Patient has had a recent decline in their functional status and demonstrates the ability to make significant improvements in function in a reasonable and predictable amount of time.     Precautions / Restrictions Precautions Precautions: Fall      Mobility  Bed Mobility Overal bed mobility: Needs Assistance Bed Mobility: Supine to Sit, Sit to Supine     Supine to sit: Mod assist Sit to supine: Min assist        Transfers Overall transfer level: Needs assistance Equipment used: Rolling walker (2 wheels) Transfers: Sit to/from Stand Sit to Stand: Mod assist           General transfer comment: had assist of 2 for safety but able to do with increased time and mod A; performed x 2    Ambulation/Gait Ambulation/Gait assistance: Min assist Gait Distance (Feet): 16 Feet Assistive device: Rolling walker (2 wheels) Gait Pattern/deviations: Step-to pattern, Decreased stride length, Decreased dorsiflexion - right, Shuffle Gait velocity: decreased Gait velocity interpretation: <1.8 ft/sec, indicate of risk for recurrent falls   General Gait Details: Genu valgus on L; very slow gait speed with labored effort and min A to balance and direct RW  Stairs            Wheelchair Mobility    Modified Rankin (Stroke Patients Only)       Balance Overall balance assessment: Needs assistance Sitting-balance support: No upper extremity supported Sitting  balance-Leahy Scale: Good     Standing balance support: Bilateral upper extremity supported Standing balance-Leahy Scale: Poor Standing balance comment: Requring RW and min A                             Pertinent Vitals/Pain Pain  Assessment Pain Assessment: 0-10 Pain Score: 3  Pain Location: Bil LE Pain Descriptors / Indicators: Burning Pain Intervention(s): Limited activity within patient's tolerance, Monitored during session    Home Living Family/patient expects to be discharged to:: Private residence Living Arrangements: Alone Available Help at Discharge: Family;Available PRN/intermittently (Son living in [place] and drive back and forth to check on pt) Type of Home: House Home Access: Stairs to enter   [employer] of Steps: 4 Alternate Level Stairs-Number of Steps: flight Home Layout: Two level;1/2 bath on main level;Bed/bath upstairs Home Equipment: Cane - single point Additional Comments: Pt reports tried to get aide for last year but only had for a couple weeks.  Pt reports supposedly she was getting aide and insurance was billed but she only had aide a couple of weeks.    Prior Function Prior Level of Function : Independent/Modified Independent             Mobility Comments: Reports ambulatory with cane ADLs Comments: Reports she was independent with adls up until last few weeks; was having to do sponge baths     Hand Dominance        Extremity/Trunk Assessment   Upper Extremity Assessment Upper Extremity Assessment: Generalized weakness    Lower Extremity Assessment Lower Extremity Assessment: RLE deficits/detail;LLE deficits/detail RLE Deficits / Details: R ankle ROM limited to neutral dorsiflexion reports chronic after all of the issues she had with her total joint replacements (hip and knees); MMT: 4/5 throughotu LLE Deficits / Details: ROM: genu valgus otherwise WFL; MMT: 4/5 throughout    Cervical / Trunk Assessment Cervical / Trunk Assessment: Kyphotic  Communication   Communication: Other (comment) (soft spoken)  Cognition Arousal/Alertness: Awake/alert Behavior During Therapy: WFL for tasks assessed/performed Overall Cognitive Status: Within Functional Limits  for tasks assessed                                 General Comments: Overall cognition WNL but tangential at times requiring redirecting        General Comments      Exercises     Assessment/Plan    PT Assessment Patient needs continued PT services  PT Problem List Decreased strength;Decreased range of motion;Decreased knowledge of use of DME;Decreased activity tolerance;Decreased balance;Decreased knowledge of precautions;Decreased mobility;Decreased coordination       PT Treatment Interventions DME instruction;Gait training;Functional mobility training;Patient/family education;Therapeutic activities;Therapeutic exercise;Balance training;Modalities;Stair training    PT Goals (Current goals can be found in the Care Plan section)  Acute Rehab PT Goals Patient Stated Goal: improve pain; rehab at d/c PT Goal Formulation: With patient Time For Goal Achievement: 01/21/23 Potential to Achieve Goals: Good    Frequency Min 2X/week     Co-evaluation               AM-PAC PT "6 Clicks" Mobility  Outcome Measure Help needed turning from your back to your side while in a flat bed without using bedrails?: A Little Help needed moving from lying on your back to sitting on the side of a flat bed without using bedrails?: A Lot Help needed  moving to and from a bed to a chair (including a wheelchair)?: A Lot Help needed standing up from a chair using your arms (e.g., wheelchair or bedside chair)?: A Lot Help needed to walk in hospital room?: Total Help needed climbing 3-5 steps with a railing? : Total 6 Click Score: 11    End of Session Equipment Utilized During Treatment: Gait belt Activity Tolerance: Patient tolerated treatment well Patient left: in bed;with call bell/phone within reach;with bed alarm set Nurse Communication: Mobility status PT Visit Diagnosis: Other abnormalities of gait and mobility (R26.89);Muscle weakness (generalized) (M62.81)    Time:  6160-7371 PT Time Calculation (min) (ACUTE ONLY): 27 min   Charges:   PT Evaluation $PT Eval Low Complexity: 1 Low PT Treatments $Gait Training: 8-22 mins        Abran Richard, PT Acute Rehab [facility] [phone]   Karlton Lemon 01/07/2023, 1:59 PM

## 2023-01-07 NOTE — Progress Notes (Addendum)
This CSW called the son again and was able to reach the son this time. This CSW provided the son with the names of the facilities that accepted his mom/patient.   The son was not able to make a decision on the preferred choice as he could not remember the name. CSW offered the son all the names of the facilities that accepted the patient/ mom. The son states that he will call me and let me know after he looked into the facilities.  CSW made him aware that a choice would need to be made tonight, or by the early AM. The son did inquire about ratings to the facilities CSW explained that medicare.gov would be the best place to do his research.  CSW informed the son that he had until 11 pm if he choose tonight to contact this CSW to inform of his decision on the bed offers.TOC will continue to follow as the family makes a decision.

## 2023-01-07 NOTE — ED Notes (Signed)
BLE Wound dressing done per [facility] orders. Unna Boots applied. Patient denies pain.

## 2023-01-07 NOTE — Progress Notes (Signed)
The patients son has picked a facility, [facility].

## 2023-01-07 NOTE — Progress Notes (Signed)
At this time the patients son has not called back this CSW has called and left a HIPAA VM. TOC will continue to update as the patient has several bed offers.

## 2023-01-07 NOTE — Progress Notes (Signed)
This CSW has attempted to call the son back, the phone still is going to VM. CSW will continue to try through the duration of the shift.

## 2023-01-07 NOTE — NC FL2 (Signed)
[place] LEVEL OF CARE FORM     IDENTIFICATION  Patient Name: Diana Sanchez Birthdate: 1956-11-29 Sex: female Admission Date (Current Location): 01/06/2023  [facility] and [place] Number:  Herbalist and Address:  [facility],  [place] [address], [place]      Provider Number: [id number]  Attending Physician Name and Address:  Default, Provider, MD  Relative Name and Phone Number:  Nancy Nordmann    Current Level of Care: Hospital Recommended Level of Care: [place] Prior Approval Number:    Date Approved/Denied:   PASRR Number: [id number]  Discharge Plan: SNF    Current Diagnoses: Patient Active Problem List   Diagnosis Date Noted   Cellulitis of left foot 07/01/2022   Type 2 diabetes mellitus ([place]) 07/01/2022   Chronic skin ulcer of lower leg (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 03/30/2022   DNR (do not resuscitate)/DNI(Do Not Intubate) 03/30/2022   Leukocytosis 03/30/2022   Lymphedema, not elsewhere classified 12/13/2021   Long term (current) use of oral hypoglycemic drugs 12/13/2021   Bilateral primary osteoarthritis of knee 12/13/2021   Iron deficiency anemia 11/30/2021   Palpitations 08/27/2021   Constipation 05/09/2020   Intrinsic eczema 05/09/2020   Memory changes 05/09/2020   Hypercholesterolemia 03/23/2020   Prediabetes 03/23/2020   Lower abdominal pain 08/29/2019   Left leg swelling 08/27/2019   Abnormal mammogram 08/24/2019   Skin ulcer of left ankle with fat layer exposed ([place]) 07/03/2019   Venous insufficiency 02/20/2019   Venous stasis dermatitis of both lower extremities 02/20/2019   Venous stasis ulcer with varicose veins ([place]) 02/20/2019   Depression 01/26/2019   GERD (gastroesophageal reflux disease) 01/26/2019   Venous stasis ulcer of left lower leg with edema of left lower leg ([place]) 01/10/2019   Bilateral hearing loss  08/01/2018   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   Personal history of MRSA (methicillin resistant Staphylococcus aureus) 06/13/2018   Migraine without status migrainosus, not intractable 04/13/2017   DDD (degenerative disc disease), lumbar 10/19/2016   Lumbar facet arthropathy 10/19/2016   Osteoarthritis of both knees 10/19/2016   Polyarthropathy, multiple sites 10/19/2016   Chronic pain syndrome 10/19/2016   Cataract of both eyes 07/30/2016   Anemia of chronic disease 09/04/2014   History of DVT (deep vein thrombosis) 08/12/2014   Wound cellulitis 08/12/2014   S/P revision of total knee, 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   Painful total knee replacement ([place]) 05/31/2014   History of Clostridium difficile colitis 04/09/2014   DOE (dyspnea on exertion) 12/07/2013   Generalized weakness 12/06/2013   Hypokalemia 12/06/2013   SLE (systemic lupus erythematosus) ([place]) 11/13/2013   Asthma 02/14/2012   Rheumatoid arthritis ([place]) 12/17/2011   Polymyalgia rheumatica ([place]) 12/17/2011    Orientation RESPIRATION BLADDER Height & Weight     Self, Time, Situation, Place  Normal Continent Weight:   Height:     BEHAVIORAL SYMPTOMS/MOOD NEUROLOGICAL BOWEL NUTRITION STATUS      Continent Diet (Regular)  AMBULATORY STATUS COMMUNICATION OF NEEDS Skin   Limited Assist (Rolling Walker) Verbally Skin abrasions, Other (Comment) (Wounds on leg)                       Personal Care Assistance Level of Assistance  Bathing, Feeding, Dressing Bathing Assistance: Maximum assistance Feeding assistance: Independent Dressing Assistance: Maximum assistance     Functional Limitations Info  Sight, Hearing, Speech Sight Info:  Adequate Hearing Info: Impaired Speech Info: Adequate    SPECIAL CARE FACTORS FREQUENCY  PT (By licensed PT), OT (By licensed OT)     PT Frequency: x5/week OT Frequency: x5/week            Contractures Contractures Info: Not present     Additional Factors Info  Code Status, Allergies, Psychotropic Code Status Info: Full Allergies Info: Tramadol Psychotropic Info: None         Current Medications (01/07/2023):  This is the current hospital active medication list Current Facility-Administered Medications  Medication Dose Route Frequency Provider Last Rate Last Admin   albuterol (PROVENTIL) (2.5 MG/3ML) 0.083% nebulizer solution 2.5 mg  2.5 mg Nebulization Q6H PRN Suzzanne Cloud, RPH       ascorbic acid (VITAMIN C) tablet 500 mg  500 mg Oral Daily Isla Pence, MD   500 mg at 01/07/23 0910   aspirin EC tablet 81 mg  81 mg Oral Daily Isla Pence, MD   81 mg at 01/07/23 0910   cyclobenzaprine (FLEXERIL) tablet 10 mg  10 mg Oral TID PRN Isla Pence, MD       furosemide (LASIX) tablet 40 mg  40 mg Oral Daily PRN Isla Pence, MD       gabapentin (NEURONTIN) capsule 100 mg  100 mg Oral BID Isla Pence, MD   100 mg at 01/07/23 9381   linaclotide (LINZESS) capsule 72 mcg  72 mcg Oral QAC breakfast Isla Pence, MD   72 mcg at 01/07/23 0910   [START ON 01/09/2023] metFORMIN (GLUCOPHAGE) tablet 500 mg  500 mg Oral BID WC Isla Pence, MD       multivitamin with minerals tablet 1 tablet  1 tablet Oral Daily Isla Pence, MD   1 tablet at 01/07/23 0910   oxyCODONE-acetaminophen (PERCOCET/ROXICET) 5-325 MG per tablet 1 tablet  1 tablet Oral Q8H PRN Suzzanne Cloud, [facility]       And   oxyCODONE (Oxy IR/ROXICODONE) immediate release tablet 5 mg  5 mg Oral Q8H PRN Suzzanne Cloud, RPH       pentoxifylline (TRENTAL) CR tablet 400 mg  400 mg Oral BID Isla Pence, MD       rosuvastatin (CRESTOR) tablet 20 mg  20 mg Oral QHS Isla Pence, MD   20 mg at 01/06/23 2132   Current Outpatient Medications  Medication Sig Dispense Refill   albuterol (PROVENTIL HFA;VENTOLIN HFA) 108 (90 Base) MCG/ACT inhaler Inhale 1-2 puffs into the lungs every 6 (six) hours as needed for wheezing or shortness of breath.     Ascorbic  Acid (VITAMIN C PO) Take 1 tablet by mouth daily.     aspirin EC 81 MG tablet Take 81 mg by mouth daily.     [employer] ACETAMINOPHEN EX ST 500 MG tablet Take 500 mg by mouth 2 (two) times daily as needed for moderate pain.     cyclobenzaprine (FLEXERIL) 10 MG tablet Take 10 mg by mouth 3 (three) times daily as needed for muscle spasms.     furosemide (LASIX) 40 MG tablet Take 1 tablet (40 mg total) by mouth daily as needed for fluid or edema. 30 tablet 1   gabapentin (NEURONTIN) 100 MG capsule Take 100 mg by mouth 2 (two) times daily.     LINZESS 72 MCG capsule Take 72 mcg by mouth daily before breakfast.     Multiple Vitamin (MULTIVITAMIN) tablet Take 1 tablet by mouth daily.     NARCAN 4 MG/0.1ML LIQD nasal spray kit Place 1  spray into the nose daily as needed (For overdose).     oxyCODONE-acetaminophen (PERCOCET) 10-325 MG tablet Take 1 tablet by mouth every 8 (eight) hours as needed for pain. for pain     PRESCRIPTION MEDICATION Take 1 capsule by mouth 2 (two) times daily. Medication: Sodium Thiosulfate 750 mg     rizatriptan (MAXALT) 5 MG tablet Take 10 mg by mouth daily as needed for migraine.     rosuvastatin (CRESTOR) 20 MG tablet Take 20 mg by mouth daily.     Alcohol Swabs (ALCOHOL WIPES) 70 % PADS by Does not apply route.     amoxicillin-clavulanate (AUGMENTIN) 875-125 MG tablet Take 1 tablet by mouth every 12 (twelve) hours. (Patient not taking: Reported on 01/06/2023) 14 tablet 0   Blood Glucose Monitoring Suppl (ACCU-CHEK GUIDE) w/Device KIT by Does not apply route.     clobetasol ointment (TEMOVATE) 0.05 % Apply 1 application. topically every other day. (Patient not taking: Reported on 09/07/2022)     doxycycline (VIBRAMYCIN) 100 MG capsule Take 1 capsule by mouth 2 (two) times daily. (Patient not taking: Reported on 01/06/2023)     predniSONE (DELTASONE) 20 MG tablet Take 40 mg by mouth daily. (Patient not taking: Reported on 09/07/2022)       Discharge Medications: Please see discharge  summary for a list of discharge medications.  Relevant Imaging Results:  Relevant Lab Results:   Additional Information SSN: [id number]  Princella Ion, LCSW

## 2023-01-07 NOTE — Progress Notes (Addendum)
This CSW spoke with the pt's son to provide an update. Diana Sanchez states there is a facillity that he and his mother are interested in her going to but does not remember the name at this time. He is going to give TOC a call back. In the mean time, Diana Sanchez is agreeable to pt being faxed out.  Addend @ 2:37 PM Bed offers pending.

## 2023-01-07 NOTE — ED Notes (Signed)
PT at bedside.

## 2023-01-07 NOTE — Consult Note (Signed)
[place] Nurse Consult Note: Reason for Consult:Bilateral LE venous insufficiency wounds. Followed by the outpatient [facility] at [facility]. Last seen by Provider Sammuel Bailiff on 12/28/22. I will continue the POC for topical care but will refrain from placement of Unna's boots ([facility] uses [employer]) as patient is having sudden onset extreme pain in bilateral LEs. Wound type:Venous insufficiency, phlebolymphedema, Clinical CEAP 6 Pressure Injury POA: N/A Measurement:To be measured by Bedside RN with next dressing change. Measurements by Ms. Jones on 12/28/22 are listed in her notes from 1/16. Please see documentation from that Encounter Wound bed:See photographs provided to EHR by EDP Drainage (amount, consistency, odor) moderate serous from LLE calf and malleolus, small from RLE Periwound:with evidence of previous wound healing Dressing procedure/placement/frequency: I have provided guidance for topical care using a NS cleanse (we do not carry Vashe) followed by placement of calcium alginate dressings topped with DrawTex (consistent with [place] POC). As indicated above, rather than place an [employer], I will secure with Kerlix roll gauze/paper tape. Feet are to be placed into Prevalon Boots for pressure redistribution and heel off loading/PI prevention. A sacral foam is placed for PI prevention in that area.  Patient is to follow up at [facility] outpatient [facility] as directed post discharge.  [place] nursing team will not follow, but will remain available to this patient, the nursing and medical teams.  Please re-consult if needed.  Thank you for inviting [place] to participate in this patient's Plan of Care.  Maudie Flakes, MSN, RN, CNS, [place], Serita Grammes, [employer], [employer] phone:  [phone]

## 2023-01-08 DIAGNOSIS — S81801A Unspecified open wound, right lower leg, initial encounter: Secondary | ICD-10-CM | POA: Diagnosis not present

## 2023-01-08 LAB — CBG MONITORING, ED: Glucose-Capillary: 105 mg/dL — ABNORMAL HIGH (ref 70–99)

## 2023-01-08 MED ORDER — SODIUM THIOSULFATE POWD
750.0000 mg | Freq: Two times a day (BID) | Status: DC
  Administered 2023-01-08 – 2023-01-09 (×4): 750 mg via ORAL
  Filled 2023-01-08 (×10): qty 1

## 2023-01-08 MED ORDER — POLYETHYLENE GLYCOL 3350 17 G PO PACK
17.0000 g | PACK | Freq: Every day | ORAL | Status: DC
  Administered 2023-01-08 – 2023-01-09 (×2): 17 g via ORAL
  Filled 2023-01-08 (×2): qty 1

## 2023-01-08 NOTE — ED Provider Notes (Signed)
Emergency Medicine Observation Re-evaluation Note  Diana Sanchez is a 67 y.o. female, seen on rounds today.  Pt initially presented to the ED for complaints of No chief complaint on file. Currently, the patient is sleeping, no concerns overnight per nursing.  Physical Exam  BP (!) 148/88   Pulse 88   Temp (!) 97.5 F (36.4 C) (Oral)   Resp 17   SpO2 100%  Physical Exam General: NAD Cardiac: RR Lungs: even, unlabored Psych: n/a  ED Course / MDM  EKG:EKG Interpretation  Date/Time:  Thursday January 06 2023 17:35:07 EST Ventricular Rate:  92 PR Interval:  196 QRS Duration: 88 QT Interval:  363 QTC Calculation: 449 R Axis:   74 Text Interpretation: Sinus rhythm Minimal ST elevation, inferior leads similar to prior no STEMI Confirmed by Antony Blackbird [phone]) on 01/07/2023 10:14:49 AM  I have reviewed the labs performed to date as well as medications administered while in observation.  Recent changes in the last 24 hours include none.  Plan  Current plan is for awaiting rehab placement with chronic pain and bilateral lower extremity wounds.    Gareth Morgan, MD 01/08/23 2137

## 2023-01-08 NOTE — ED Notes (Signed)
Pt c/o leg pain, given flexeril

## 2023-01-08 NOTE — Progress Notes (Signed)
CSW has submitted AUTH, TOC will update as needed.  The patient will DC to [facility].

## 2023-01-08 NOTE — Progress Notes (Signed)
Auth still pending

## 2023-01-09 DIAGNOSIS — S81801A Unspecified open wound, right lower leg, initial encounter: Secondary | ICD-10-CM | POA: Diagnosis not present

## 2023-01-09 NOTE — ED Notes (Signed)
Patient reports increasing pain to BLE.  Medicated with PRN medications.  Assisted to use bedpan by NA.

## 2023-01-09 NOTE — ED Notes (Signed)
Visitor at bedside.

## 2023-01-09 NOTE — ED Provider Notes (Signed)
Emergency Medicine Observation Re-evaluation Note  Diana Sanchez is a 67 y.o. female, seen on rounds today.  Pt initially presented to the ED for complaints of No chief complaint on file. Currently, the patient is awaiting placement. Denies concerns this AM. Is eating breakfast and very grateful towards staff.  Physical Exam  BP 125/75   Pulse 84   Temp (!) 97.5 F (36.4 C) (Oral)   Resp 18   SpO2 100%  Physical Exam General: NAD Cardiac: RR Lungs: even, unlabored Psych: normal affect  ED Course / MDM  EKG:EKG Interpretation  Date/Time:  Thursday January 06 2023 17:35:07 EST Ventricular Rate:  92 PR Interval:  196 QRS Duration: 88 QT Interval:  363 QTC Calculation: 449 R Axis:   74 Text Interpretation: Sinus rhythm Minimal ST elevation, inferior leads similar to prior no STEMI Confirmed by Antony Blackbird [phone]) on 01/07/2023 10:14:49 AM  I have reviewed the labs performed to date as well as medications administered while in observation.  Recent changes in the last 24 hours include none.  Plan  Current plan is for awaiting rehab placement with chronic pain and bilateral lower extremity wounds.      Gareth Morgan, MD 01/09/23 2250

## 2023-01-09 NOTE — Progress Notes (Signed)
Pt auth still pending. TOC to follow.  Marland KitchenArlie Solomons.Ramello Cordial, MSW, [place]  Transitions of Care Clinical Social Worker I Direct Dial: [phone]  Fax: [phone] [email or web address]

## 2023-01-09 NOTE — ED Notes (Signed)
Pt given recliner chair to sit in and elevate her legs.

## 2023-01-09 NOTE — ED Notes (Signed)
Pt bandages changed on bilateral lower legs, pt given ice packs per request.

## 2023-01-10 DIAGNOSIS — S81801A Unspecified open wound, right lower leg, initial encounter: Secondary | ICD-10-CM | POA: Diagnosis not present

## 2023-01-10 NOTE — ED Provider Notes (Signed)
Emergency Medicine Observation Re-evaluation Note  Diana Sanchez is a 67 y.o. female, seen on rounds today.  Pt initially presented to the ED for complaints of No chief complaint on file. Currently, the patient is no acute distress.  Physical Exam  BP 128/77 (BP Location: Left Arm)   Pulse 93   Temp 98.9 F (37.2 C) (Oral)   Resp 16   SpO2 100%  Physical Exam   ED Course / MDM  EKG:EKG Interpretation  Date/Time:  Thursday January 06 2023 17:35:07 EST Ventricular Rate:  92 PR Interval:  196 QRS Duration: 88 QT Interval:  363 QTC Calculation: 449 R Axis:   74 Text Interpretation: Sinus rhythm Minimal ST elevation, inferior leads similar to prior no STEMI Confirmed by Antony Blackbird [phone]) on 01/07/2023 10:14:49 AM  I have reviewed the labs performed to date as well as medications administered while in observation.  Recent changes in the last 24 hours include placement.  Plan  Current plan is for placement.    Lacretia Leigh, MD 01/10/23 [phone]

## 2023-01-10 NOTE — ED Notes (Signed)
[facility]  Here for transport, attempted to call [facility] at [phone] unsuccessful

## 2023-01-10 NOTE — Progress Notes (Addendum)
Pt's Auth was approved. TOC to coordinate d/c to [facility]. Awaiting room assignment and time the pt is able to admit. Will notify EDP and RN via secure chat. [facility] to transport.   Addend @ 9:09 AM Pt can d/c to SNF at 12 PM. EDP and RN notified via secure chat. Report info given to RN. Attempted to contact pt's son at 43:11 AM without success - left HIPAA Compliant voicemail.   Addend @ 10:20 AM [facility] called and transport scheduled for 12 noon.  Addend @ 10:40 AM Faxed AVS to [facility], admissions rep.   Addend @ 1:16 PM This CSW spoke to the pt's son who thanked this Probation officer for the help. He has spoken to his mother since she arrived at [facility].

## 2023-01-11 LAB — CULTURE, BLOOD (ROUTINE X 2): Culture: NO GROWTH

## 2023-02-14 ENCOUNTER — Ambulatory Visit: Payer: Medicare HMO | Admitting: Podiatry

## 2024-01-11 ENCOUNTER — Other Ambulatory Visit: Payer: Self-pay | Admitting: Nurse Practitioner

## 2024-01-11 DIAGNOSIS — R109 Unspecified abdominal pain: Secondary | ICD-10-CM

## 2024-01-12 ENCOUNTER — Other Ambulatory Visit: Payer: Self-pay | Admitting: Nurse Practitioner

## 2024-01-12 DIAGNOSIS — Z1231 Encounter for screening mammogram for malignant neoplasm of breast: Secondary | ICD-10-CM

## 2024-02-26 IMAGING — DX DG TIBIA/FIBULA 2V*L*
4 series · 4 of 4 positions shown · non-contrast
Comparison: None.

CLINICAL DATA: Nonhealing distal foreleg wound, left foreleg.

EXAM:
LEFT TIBIA AND FIBULA - 2 VIEW

[tibia ap (1 of 2)]
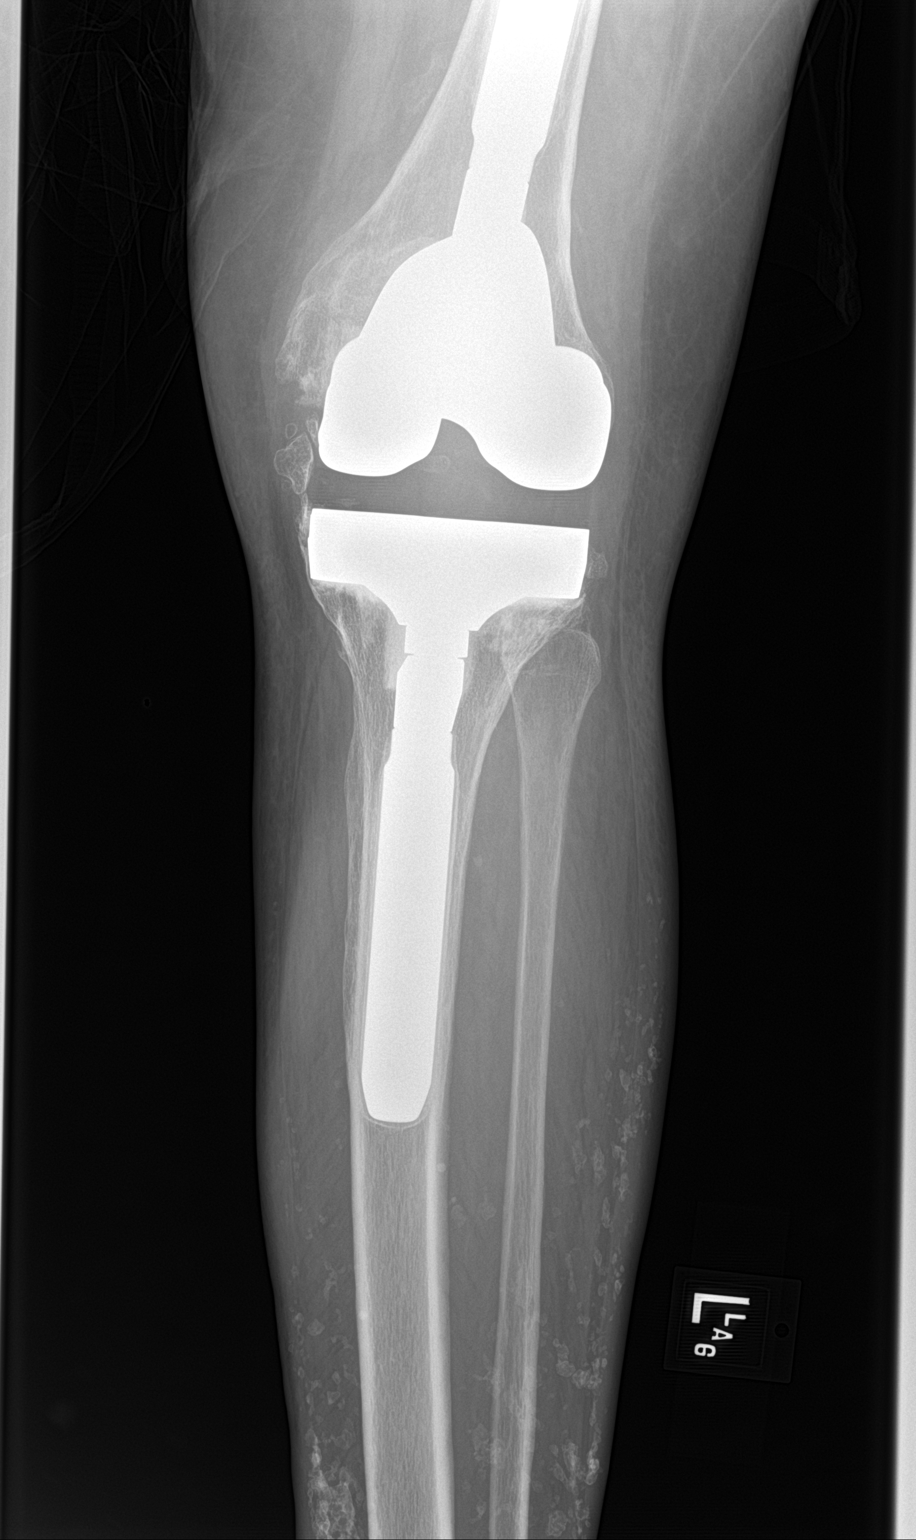

[tibia ap (2 of 2)]
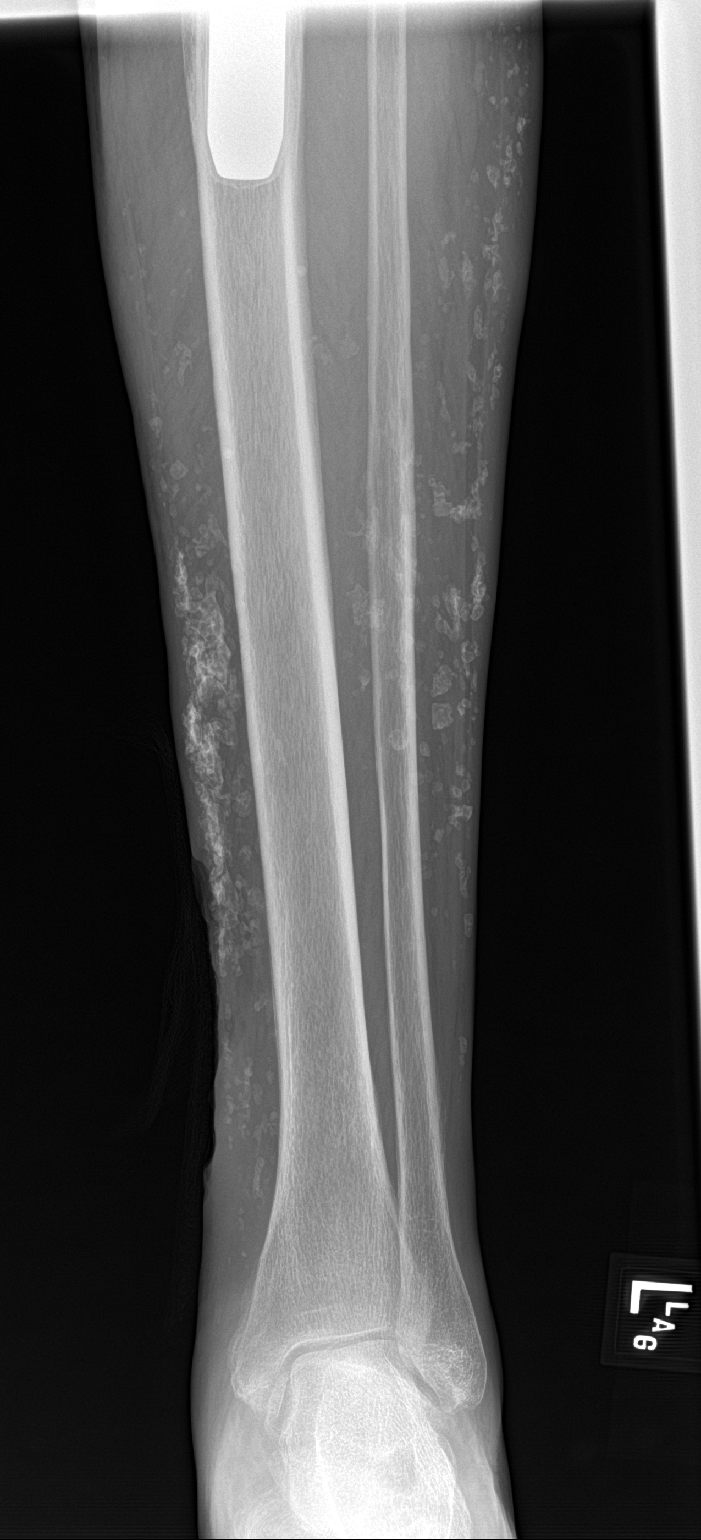

[tibia lat (1 of 2)]
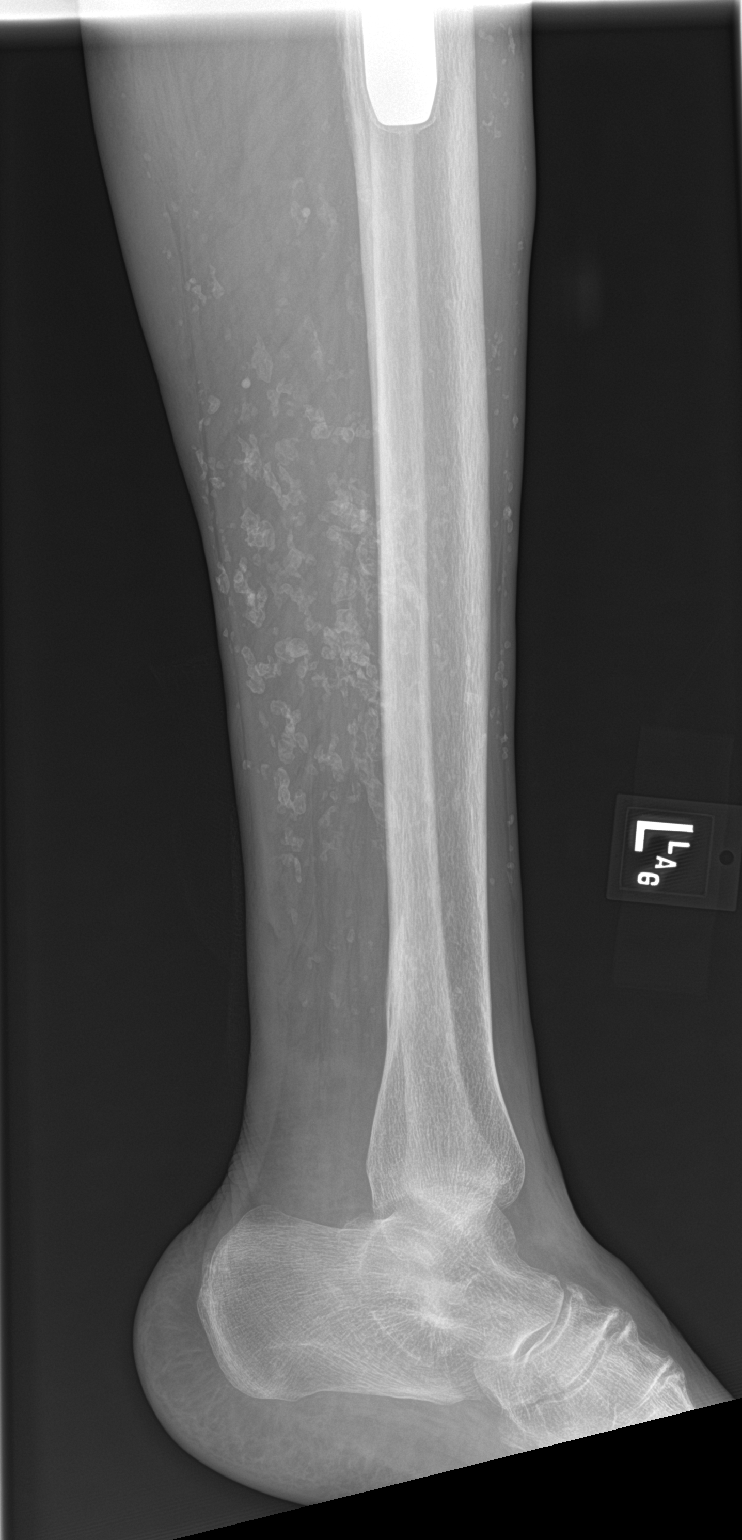

[tibia lat (2 of 2)]
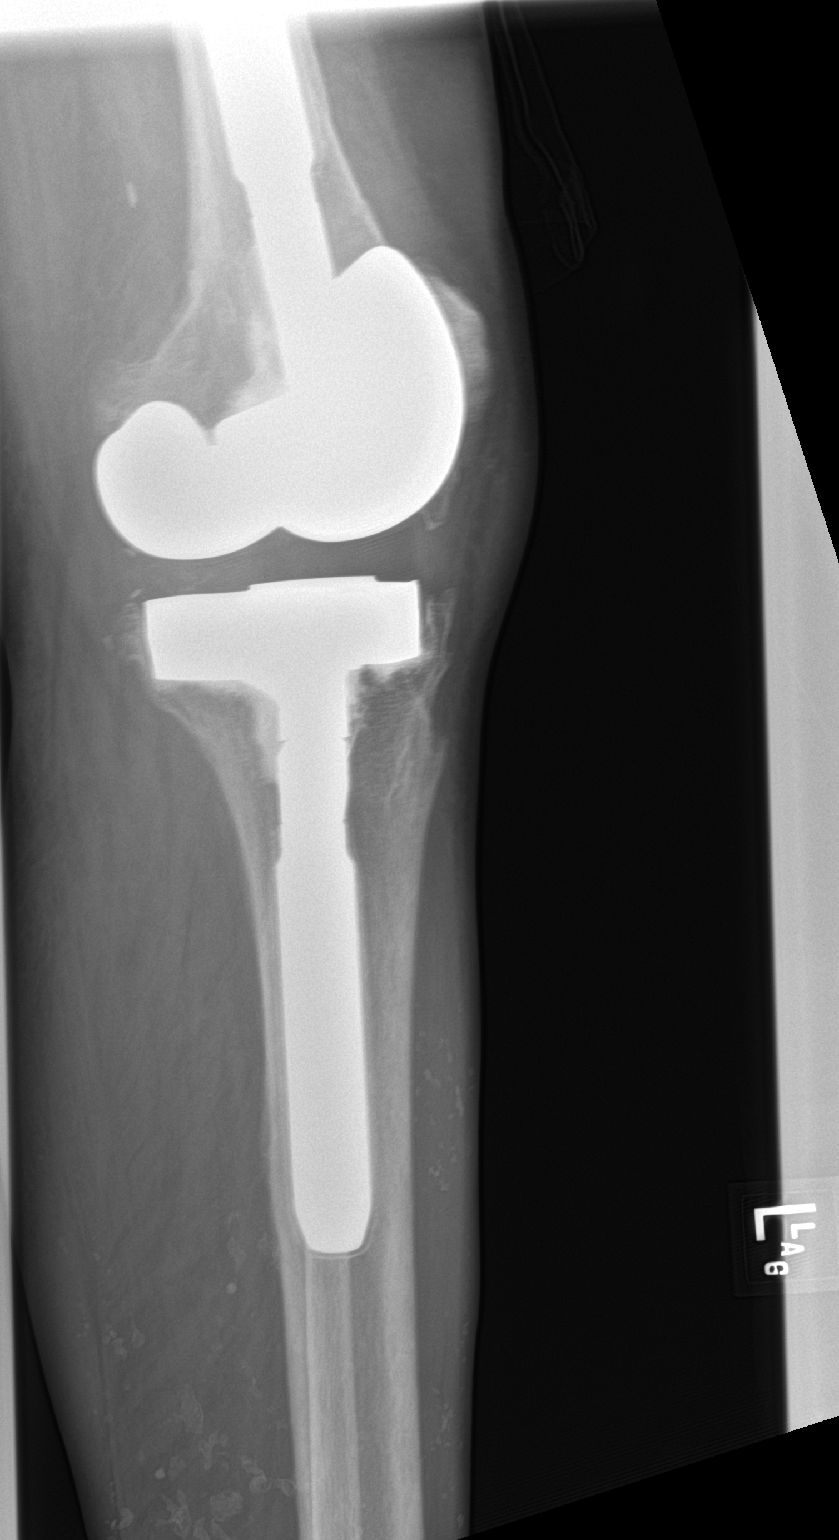

[4 of 4 positions shown; findings below may reference images not displayed]

FINDINGS: Generalized osteopenia. No fracture is evident. There is a revision
total knee arthroplasty, with dystrophic calcifications along the
medial aspect of the prosthetic joint space and no evidence of
loosening in the visualized portion.

There are numerous subcutaneous calcifications in the subcutaneous
plane in the proximal, mid and distal foreleg greatest in the mid
foreleg and could relate to dystrophic calcifications such as due to
prior infection, burn injury, trauma or dermatomyositis.

There is a broad-based ulcer in the medial aspect of the distal
foreleg measuring up to 8 cm across and up to 6 mm in depth.

No underlying erosive bone lesion is seen. There is mild generalized
edema in the distal foreleg and ankle. The ankle mortise appears
symmetric.
IMPRESSION: 1. Broad-based ulcer in the medial distal foreleg, without acute
underlying bony abnormality. Osteopenia.
2. Partially visible revision total knee arthroplasty, visualized
portion without evidence of loosening.
3. Soft tissue calcifications, most numerous in the mid to distal
foreleg with differential diagnosis as above.
4. Mild edema in the distal foreleg and ankle.

## 2024-03-15 ENCOUNTER — Ambulatory Visit
Admission: RE | Admit: 2024-03-15 | Discharge: 2024-03-15 | Disposition: A | Discharge status: Home / Self Care | Source: Ambulatory Visit | Attending: Nurse Practitioner

## 2024-03-15 DIAGNOSIS — Z1231 Encounter for screening mammogram for malignant neoplasm of breast: Secondary | ICD-10-CM

## 2024-03-29 ENCOUNTER — Ambulatory Visit
Admission: RE | Admit: 2024-03-29 | Discharge: 2024-03-29 | Disposition: A | Discharge status: Home / Self Care | Source: Ambulatory Visit | Attending: Nurse Practitioner | Admitting: Nurse Practitioner

## 2024-03-29 DIAGNOSIS — R109 Unspecified abdominal pain: Secondary | ICD-10-CM

## 2024-05-10 ENCOUNTER — Other Ambulatory Visit: Payer: Self-pay | Admitting: Nurse Practitioner

## 2024-05-10 DIAGNOSIS — K8020 Calculus of gallbladder without cholecystitis without obstruction: Secondary | ICD-10-CM

## 2024-05-17 ENCOUNTER — Other Ambulatory Visit

## 2024-08-03 LAB — COLOGUARD: COLOGUARD: NEGATIVE

## 2024-09-19 ENCOUNTER — Ambulatory Visit (INDEPENDENT_AMBULATORY_CARE_PROVIDER_SITE_OTHER): Admitting: Podiatry

## 2024-09-19 DIAGNOSIS — Z91198 Patient's noncompliance with other medical treatment and regimen for other reason: Secondary | ICD-10-CM

## 2024-09-24 NOTE — Progress Notes (Signed)
 1. Failure to attend appointment with reason given    Appointment rescheduled by patient.

## 2024-09-25 ENCOUNTER — Encounter: Payer: Self-pay | Admitting: Podiatry

## 2024-09-25 ENCOUNTER — Ambulatory Visit: Admitting: Podiatry

## 2024-09-25 ENCOUNTER — Ambulatory Visit (INDEPENDENT_AMBULATORY_CARE_PROVIDER_SITE_OTHER): Admitting: Podiatry

## 2024-09-25 DIAGNOSIS — M79675 Pain in left toe(s): Secondary | ICD-10-CM

## 2024-09-25 DIAGNOSIS — M79674 Pain in right toe(s): Secondary | ICD-10-CM | POA: Diagnosis not present

## 2024-09-25 DIAGNOSIS — Z91198 Patient's noncompliance with other medical treatment and regimen for other reason: Secondary | ICD-10-CM

## 2024-09-25 DIAGNOSIS — B351 Tinea unguium: Secondary | ICD-10-CM | POA: Diagnosis not present

## 2024-09-25 NOTE — Progress Notes (Signed)
 1. Failure to attend appointment with reason given    Rescheduled to earlier time slot today.

## 2024-09-30 NOTE — Progress Notes (Signed)
  Subjective:  Patient ID: Diana Sanchez, female    DOB: June 25, 1956,  MRN: [id number]  Diana Sanchez presents to clinic today for painful mycotic toenails of both feet that are difficult to trim. Pain interferes with daily activities and wearing enclosed shoe gear comfortably.  Chief Complaint  Patient presents with   Diabetes    Patient states that she does not remember her A1c, but Patient stated that she last saw her PCP in    New problem(s): None.   PCP is Delores Rojelio Jenkins, NP.  Allergies  Allergen Reactions   Tramadol     nausea Other reaction(s): Unknown    Review of Systems: Negative except as noted in the HPI.  Objective: No changes noted in today's physical examination. There were no vitals filed for this visit. Diana Sanchez is a pleasant 68 y.o. female WD, WN in NAD. AAO x 3.  Vascular Examination: Capillary refill time immediate b/l. Palpable pedal pulses. Pedal hair present b/l. Pedal edema trace LLE. No pain with calf compression b/l. Skin temperature gradient WNL b/l. No cyanosis or clubbing b/l. No ischemia or gangrene noted b/l. Lower extremity compression wrap noted LLE which is clean, dry and intact. Evidence of skin changes consistent with long term venous stasis BLE.  Neurological Examination: Sensation grossly intact b/l with 10 gram monofilament. Vibratory sensation intact b/l.   Dermatological Examination: Pedal skin with normal turgor, texture and tone b/l.  No open wounds. No interdigital macerations.   Toenails 1-5 b/l thick, discolored, elongated with subungual debris and pain on dorsal palpation.   No hyperkeratotic nor porokeratotic lesions.  Musculoskeletal Examination: Muscle strength 5/5 to all lower extremity muscle groups bilaterally. No pain, crepitus or joint limitation noted with ROM bilateral LE. HAV with bunion deformity noted b/l LE. Hammertoe deformity noted 2-5 b/l.  Radiographs:  None  Assessment/Plan: 1. Pain due to onychomycosis of toenails of both feet   Patient was evaluated and treated. All patient's and/or POA's questions/concerns addressed on today's visit. Mycotic toenails 1-5 b/l debrided in length and girth without incident. Continue soft, supportive shoe gear daily. Report any pedal injuries to medical professional. Call office if there are any quesitons/concerns. -Patient/POA to call should there be question/concern in the interim.   Return in about 3 months (around 12/26/2024).  Delon LITTIE Merlin, DPM      [facility] LOCATION: [address] [place] [address]                   Office [phone]   [facility] LOCATION: [address] [place], [place] [address] Office [phone]

## 2024-10-16 ENCOUNTER — Other Ambulatory Visit: Payer: Self-pay | Admitting: Nurse Practitioner

## 2024-10-16 DIAGNOSIS — K8020 Calculus of gallbladder without cholecystitis without obstruction: Secondary | ICD-10-CM

## 2024-10-18 ENCOUNTER — Emergency Department (HOSPITAL_COMMUNITY)

## 2024-10-18 ENCOUNTER — Other Ambulatory Visit: Payer: Self-pay

## 2024-10-18 ENCOUNTER — Encounter (HOSPITAL_COMMUNITY): Payer: Self-pay

## 2024-10-18 ENCOUNTER — Inpatient Hospital Stay (HOSPITAL_COMMUNITY)
Admission: EM | Admit: 2024-10-18 | Discharge: 2024-10-23 | DRG: 690 | Disposition: A | Discharge status: Skilled Nursing Facility | Attending: Internal Medicine | Admitting: Internal Medicine

## 2024-10-18 DIAGNOSIS — I83892 Varicose veins of left lower extremities with other complications: Secondary | ICD-10-CM | POA: Diagnosis present

## 2024-10-18 DIAGNOSIS — K5900 Constipation, unspecified: Secondary | ICD-10-CM | POA: Diagnosis present

## 2024-10-18 DIAGNOSIS — R32 Unspecified urinary incontinence: Secondary | ICD-10-CM | POA: Diagnosis present

## 2024-10-18 DIAGNOSIS — L88 Pyoderma gangrenosum: Secondary | ICD-10-CM | POA: Diagnosis present

## 2024-10-18 DIAGNOSIS — Z86718 Personal history of other venous thrombosis and embolism: Secondary | ICD-10-CM

## 2024-10-18 DIAGNOSIS — M159 Polyosteoarthritis, unspecified: Secondary | ICD-10-CM | POA: Diagnosis present

## 2024-10-18 DIAGNOSIS — L97322 Non-pressure chronic ulcer of left ankle with fat layer exposed: Secondary | ICD-10-CM | POA: Diagnosis present

## 2024-10-18 DIAGNOSIS — K8070 Calculus of gallbladder and bile duct without cholecystitis without obstruction: Secondary | ICD-10-CM | POA: Diagnosis present

## 2024-10-18 DIAGNOSIS — Z8614 Personal history of Methicillin resistant Staphylococcus aureus infection: Secondary | ICD-10-CM

## 2024-10-18 DIAGNOSIS — M130 Polyarthritis, unspecified: Secondary | ICD-10-CM

## 2024-10-18 DIAGNOSIS — R52 Pain, unspecified: Secondary | ICD-10-CM

## 2024-10-18 DIAGNOSIS — Z96641 Presence of right artificial hip joint: Secondary | ICD-10-CM | POA: Diagnosis present

## 2024-10-18 DIAGNOSIS — M353 Polymyalgia rheumatica: Secondary | ICD-10-CM | POA: Diagnosis present

## 2024-10-18 DIAGNOSIS — M069 Rheumatoid arthritis, unspecified: Secondary | ICD-10-CM | POA: Diagnosis present

## 2024-10-18 DIAGNOSIS — I10 Essential (primary) hypertension: Secondary | ICD-10-CM | POA: Diagnosis present

## 2024-10-18 DIAGNOSIS — L97909 Non-pressure chronic ulcer of unspecified part of unspecified lower leg with unspecified severity: Secondary | ICD-10-CM | POA: Diagnosis present

## 2024-10-18 DIAGNOSIS — Z8619 Personal history of other infectious and parasitic diseases: Secondary | ICD-10-CM

## 2024-10-18 DIAGNOSIS — R7881 Bacteremia: Secondary | ICD-10-CM | POA: Diagnosis present

## 2024-10-18 DIAGNOSIS — I878 Other specified disorders of veins: Secondary | ICD-10-CM | POA: Diagnosis present

## 2024-10-18 DIAGNOSIS — Z66 Do not resuscitate: Secondary | ICD-10-CM | POA: Diagnosis present

## 2024-10-18 DIAGNOSIS — Z7982 Long term (current) use of aspirin: Secondary | ICD-10-CM

## 2024-10-18 DIAGNOSIS — D638 Anemia in other chronic diseases classified elsewhere: Secondary | ICD-10-CM | POA: Diagnosis not present

## 2024-10-18 DIAGNOSIS — N10 Acute tubulo-interstitial nephritis: Secondary | ICD-10-CM | POA: Diagnosis not present

## 2024-10-18 DIAGNOSIS — R531 Weakness: Secondary | ICD-10-CM | POA: Diagnosis present

## 2024-10-18 DIAGNOSIS — E7800 Pure hypercholesterolemia, unspecified: Secondary | ICD-10-CM | POA: Diagnosis present

## 2024-10-18 DIAGNOSIS — L97929 Non-pressure chronic ulcer of unspecified part of left lower leg with unspecified severity: Secondary | ICD-10-CM | POA: Diagnosis present

## 2024-10-18 DIAGNOSIS — Z7952 Long term (current) use of systemic steroids: Secondary | ICD-10-CM

## 2024-10-18 DIAGNOSIS — G8929 Other chronic pain: Secondary | ICD-10-CM | POA: Diagnosis present

## 2024-10-18 DIAGNOSIS — M329 Systemic lupus erythematosus, unspecified: Secondary | ICD-10-CM | POA: Diagnosis present

## 2024-10-18 DIAGNOSIS — Z885 Allergy status to narcotic agent status: Secondary | ICD-10-CM

## 2024-10-18 DIAGNOSIS — E119 Type 2 diabetes mellitus without complications: Secondary | ICD-10-CM

## 2024-10-18 DIAGNOSIS — Z1152 Encounter for screening for COVID-19: Secondary | ICD-10-CM

## 2024-10-18 DIAGNOSIS — Z5941 Food insecurity: Secondary | ICD-10-CM

## 2024-10-18 DIAGNOSIS — B9620 Unspecified Escherichia coli [E. coli] as the cause of diseases classified elsewhere: Secondary | ICD-10-CM | POA: Diagnosis present

## 2024-10-18 DIAGNOSIS — R7303 Prediabetes: Secondary | ICD-10-CM | POA: Diagnosis present

## 2024-10-18 DIAGNOSIS — N390 Urinary tract infection, site not specified: Secondary | ICD-10-CM | POA: Diagnosis present

## 2024-10-18 DIAGNOSIS — N12 Tubulo-interstitial nephritis, not specified as acute or chronic: Principal | ICD-10-CM | POA: Diagnosis present

## 2024-10-18 DIAGNOSIS — M3219 Other organ or system involvement in systemic lupus erythematosus: Secondary | ICD-10-CM

## 2024-10-18 DIAGNOSIS — I700 Atherosclerosis of aorta: Secondary | ICD-10-CM | POA: Diagnosis present

## 2024-10-18 DIAGNOSIS — G43909 Migraine, unspecified, not intractable, without status migrainosus: Secondary | ICD-10-CM | POA: Diagnosis present

## 2024-10-18 DIAGNOSIS — Z8249 Family history of ischemic heart disease and other diseases of the circulatory system: Secondary | ICD-10-CM

## 2024-10-18 DIAGNOSIS — R590 Localized enlarged lymph nodes: Secondary | ICD-10-CM | POA: Diagnosis present

## 2024-10-18 DIAGNOSIS — Z95820 Peripheral vascular angioplasty status with implants and grafts: Secondary | ICD-10-CM

## 2024-10-18 DIAGNOSIS — Z79899 Other long term (current) drug therapy: Secondary | ICD-10-CM

## 2024-10-18 LAB — URINALYSIS, W/ REFLEX TO CULTURE (INFECTION SUSPECTED)
Bilirubin Urine: NEGATIVE
Glucose, UA: NEGATIVE mg/dL
Hgb urine dipstick: NEGATIVE
Ketones, ur: NEGATIVE mg/dL
Nitrite: NEGATIVE
Protein, ur: NEGATIVE mg/dL
Specific Gravity, Urine: 1.011 (ref 1.005–1.030)
pH: 8 (ref 5.0–8.0)

## 2024-10-18 LAB — COMPREHENSIVE METABOLIC PANEL WITH GFR
ALT: 9 U/L (ref 0–44)
AST: 23 U/L (ref 15–41)
Albumin: 3.9 g/dL (ref 3.5–5.0)
Alkaline Phosphatase: 93 U/L (ref 38–126)
Anion gap: 11 (ref 5–15)
BUN: 14 mg/dL (ref 8–23)
CO2: 23 mmol/L (ref 22–32)
Calcium: 9.9 mg/dL (ref 8.9–10.3)
Chloride: 99 mmol/L (ref 98–111)
Creatinine, Ser: 0.79 mg/dL (ref 0.44–1.00)
GFR, Estimated: >60 mL/min (ref >60)
Glucose, Bld: 100 mg/dL — ABNORMAL HIGH (ref 70–99)
Potassium: 4.2 mmol/L (ref 3.5–5.1)
Sodium: 134 mmol/L — ABNORMAL LOW (ref 135–145)
Total Bilirubin: 0.5 mg/dL (ref 0.0–1.2)
Total Protein: 8.5 g/dL — ABNORMAL HIGH (ref 6.5–8.1)

## 2024-10-18 LAB — RESP PANEL BY RT-PCR (RSV, FLU A&B, COVID)  RVPGX2
Influenza A by PCR: NEGATIVE
Influenza B by PCR: NEGATIVE
Resp Syncytial Virus by PCR: NEGATIVE
SARS Coronavirus 2 by RT PCR: NEGATIVE

## 2024-10-18 LAB — CBC WITH DIFFERENTIAL/PLATELET
Abs Immature Granulocytes: 0.04 K/uL (ref 0.00–0.07)
Basophils Absolute: 0 K/uL (ref 0.0–0.1)
Basophils Relative: 0 %
Eosinophils Absolute: 0 K/uL (ref 0.0–0.5)
Eosinophils Relative: 0 %
HCT: 31.8 % — ABNORMAL LOW (ref 36.0–46.0)
Hemoglobin: 10.1 g/dL — ABNORMAL LOW (ref 12.0–15.0)
Immature Granulocytes: 0 %
Lymphocytes Relative: 12 %
Lymphs Abs: 1.1 K/uL (ref 0.7–4.0)
MCH: 27 pg (ref 26.0–34.0)
MCHC: 31.8 g/dL (ref 30.0–36.0)
MCV: 85 fL (ref 80.0–100.0)
Monocytes Absolute: 0.6 K/uL (ref 0.1–1.0)
Monocytes Relative: 7 %
Neutro Abs: 7.4 K/uL (ref 1.7–7.7)
Neutrophils Relative %: 81 %
Platelets: 307 K/uL (ref 150–400)
RBC: 3.74 MIL/uL — ABNORMAL LOW (ref 3.87–5.11)
RDW: 14.7 % (ref 11.5–15.5)
WBC: 9.2 K/uL (ref 4.0–10.5)
nRBC: 0 % (ref 0.0–0.2)

## 2024-10-18 LAB — CK: Total CK: 50 U/L (ref 38–234)

## 2024-10-18 LAB — PROTIME-INR
INR: 1 (ref 0.8–1.2)
Prothrombin Time: 13.8 s (ref 11.4–15.2)

## 2024-10-18 LAB — TSH: TSH: 0.448 u[IU]/mL (ref 0.350–4.500)

## 2024-10-18 LAB — I-STAT CG4 LACTIC ACID, ED: Lactic Acid, Venous: 1.2 mmol/L (ref 0.5–1.9)

## 2024-10-18 MED ORDER — ENSURE PLUS HIGH PROTEIN PO LIQD
237.0000 mL | Freq: Two times a day (BID) | ORAL | Status: DC
  Administered 2024-10-19 – 2024-10-23 (×9): 237 mL via ORAL

## 2024-10-18 MED ORDER — BISACODYL 5 MG PO TBEC
5.0000 mg | DELAYED_RELEASE_TABLET | Freq: Every day | ORAL | Status: DC | PRN
  Administered 2024-10-20 – 2024-10-21 (×2): 5 mg via ORAL
  Filled 2024-10-18 (×2): qty 1

## 2024-10-18 MED ORDER — ACETAMINOPHEN 650 MG RE SUPP: 650.0000 mg | Freq: Four times a day (QID) | RECTAL | Status: DC | PRN

## 2024-10-18 MED ORDER — MELATONIN 5 MG PO TABS
5.0000 mg | ORAL_TABLET | Freq: Every day | ORAL | Status: DC
  Administered 2024-10-18 – 2024-10-22 (×5): 5 mg via ORAL
  Filled 2024-10-18 (×5): qty 1

## 2024-10-18 MED ORDER — ONDANSETRON HCL 4 MG/2ML IJ SOLN: 4.0000 mg | Freq: Four times a day (QID) | INTRAMUSCULAR | Status: DC | PRN

## 2024-10-18 MED ORDER — ONDANSETRON HCL 4 MG PO TABS: 4.0000 mg | ORAL_TABLET | Freq: Four times a day (QID) | ORAL | Status: DC | PRN

## 2024-10-18 MED ORDER — SODIUM CHLORIDE 0.9 % IV SOLN
2.0000 g | Freq: Once | INTRAVENOUS | Status: AC
  Administered 2024-10-18: 2 g via INTRAVENOUS
  Filled 2024-10-18: qty 12.5

## 2024-10-18 MED ORDER — ALBUTEROL SULFATE HFA 108 (90 BASE) MCG/ACT IN AERS: 1.0000 | INHALATION_SPRAY | Freq: Four times a day (QID) | RESPIRATORY_TRACT | Status: DC | PRN

## 2024-10-18 MED ORDER — ACETAMINOPHEN 325 MG PO TABS
650.0000 mg | ORAL_TABLET | Freq: Four times a day (QID) | ORAL | Status: DC | PRN
  Administered 2024-10-19 (×2): 650 mg via ORAL
  Filled 2024-10-18 (×2): qty 2

## 2024-10-18 MED ORDER — SODIUM CHLORIDE 0.9 % IV SOLN
2.0000 g | INTRAVENOUS | Status: DC
  Administered 2024-10-19 – 2024-10-22 (×4): 2 g via INTRAVENOUS
  Filled 2024-10-18 (×4): qty 20

## 2024-10-18 MED ORDER — OXYCODONE HCL 5 MG PO TABS
5.0000 mg | ORAL_TABLET | Freq: Once | ORAL | Status: AC
  Administered 2024-10-18: 5 mg via ORAL
  Filled 2024-10-18: qty 1

## 2024-10-18 MED ORDER — ALBUTEROL SULFATE (2.5 MG/3ML) 0.083% IN NEBU: 2.5000 mg | INHALATION_SOLUTION | Freq: Four times a day (QID) | RESPIRATORY_TRACT | Status: DC | PRN

## 2024-10-18 MED ORDER — SODIUM CHLORIDE 0.9 % IV BOLUS
500.0000 mL | Freq: Once | INTRAVENOUS | Status: AC
  Administered 2024-10-18: 500 mL via INTRAVENOUS

## 2024-10-18 MED ORDER — IOHEXOL 350 MG/ML SOLN
100.0000 mL | Freq: Once | INTRAVENOUS | Status: AC | PRN
  Administered 2024-10-18: 100 mL via INTRAVENOUS

## 2024-10-18 MED ORDER — VITAMIN C 500 MG PO TABS
500.0000 mg | ORAL_TABLET | Freq: Every day | ORAL | Status: DC
  Administered 2024-10-19 – 2024-10-23 (×5): 500 mg via ORAL
  Filled 2024-10-18 (×5): qty 1

## 2024-10-18 NOTE — H&P (Addendum)
 History and Physical    Patient: Diana Sanchez FMW:[id number] DOB: 06-01-1956 DOA: 10/18/2024 DOS: the patient was seen and examined on 10/18/2024 PCP: Delores Rojelio Jenkins, NP  Patient coming from: Home  Chief Complaint:  Chief Complaint  Patient presents with   Fever   Generalized Body Aches   HPI: Diana Sanchez is a 68 y.o. 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  She follows with plastic surgery at [facility] for this.  The patient tells me she had a fish scale skin graft placed the week before last.  Chart review from the plastic surgeons note reports Marigan skin substitute on 10/14 .  The patient says this was an outpatient procedure.  She did not require a Foley catheter and that her wound has been doing beautifully since the procedure.  7 days ago she developed fevers and chills and whole body aches.  She has had dysuria and increased urinary frequency and been incontinent of urine for the last week which is not her baseline. She really has felt terrible and is not wanting to get out of bed.  She denies nausea or vomiting just no appetite.  She is only been drinking liquids.  She does not remember ever having a kidney infection.  She maybe had a UTI when she was a young girl.  She does have pain on her right side.  She did not want to come to the hospital but she was just feeling so bad she really had no choice. In the emergency department the patient's symptoms were vague but the report of fevers and shaking chills were concerning for infection.  She was afebrile in the ED she did not have an elevated white count but she did have CT scan of her chest and abdomen and pelvis because of her complaints of pain.  The CT revealed pyelonephritis on the right.  Interestingly her UA did not appear infected.  The  patient was started on Maxipime initially because of concerns of overall sepsis.  She will be admitted to the hospitalist service for further treatment.   Review of Systems: As mentioned in the history of present illness. All other systems reviewed and are negative. Past Medical History:  Diagnosis Date   Arthritis    DVT of lower limb, acute (HCC)    FH: total knee replacement    H/O fracture of hip 03/21/2020   Hypercholesteremia    Hypertension    Infection    in knee   Migraines    Past Surgical History:  Procedure Laterality Date   BREAST BIOPSY Right 12/20/2019   axilla u/s bx   CESAREAN SECTION     HIP SURGERY     TOTAL KNEE ARTHROPLASTY     Social History:  reports that she has never smoked. She has never used smokeless tobacco. She reports that she does not drink alcohol. No history on file for drug use.  Allergies  Allergen Reactions   Tramadol Other (See Comments)    nausea  Other reaction(s): Unknown  Other Reaction(s): Dizziness, Unknown  Other reaction(s): Nausea And Vomiting, nausea, makes a really bad headache too    Family History  Problem Relation Age of Onset   Breast cancer Neg Hx    BRCA 1/2 Neg Hx     Prior to Admission medications   Medication Sig  Start Date End Date Taking? Authorizing Provider  albuterol  (PROVENTIL  HFA;VENTOLIN  HFA) 108 (90 Base) MCG/ACT inhaler Inhale 1-2 puffs into the lungs every 6 (six) hours as needed for wheezing or shortness of breath. 02/14/12   [provider]  Alcohol Swabs (ALCOHOL WIPES) 70 % PADS by Does not apply route. 04/04/20   [provider]  amoxicillin -clavulanate (AUGMENTIN ) 875-125 MG tablet Take 1 tablet by mouth every 12 (twelve) hours. 09/07/22   Randol Simmonds, MD  Ascorbic Acid  (VITAMIN C  PO) Take 1 tablet by mouth daily.    [provider]  aspirin  EC 81 MG tablet Take 81 mg by mouth daily.    [provider]  Blood Glucose Monitoring Suppl (ACCU-CHEK GUIDE) w/Device  KIT by Does not apply route. 04/04/20   [provider]  clobetasol ointment (TEMOVATE) 0.05 % Apply 1 application  topically every other day. 03/09/22   [provider]  [employer] ACETAMINOPHEN  EX ST 500 MG tablet Take 500 mg by mouth 2 (two) times daily as needed for moderate pain. 03/06/22   [provider]  cyclobenzaprine  (FLEXERIL ) 10 MG tablet Take 10 mg by mouth 3 (three) times daily as needed for muscle spasms.    [provider]  doxycycline  (VIBRAMYCIN ) 100 MG capsule Take 1 capsule by mouth 2 (two) times daily. 09/02/22   [provider]  furosemide  (LASIX ) 40 MG tablet Take 1 tablet (40 mg total) by mouth daily as needed for fluid or edema. 07/02/22 07/02/23  Gonfa, Taye T, MD  gabapentin  (NEURONTIN ) 100 MG capsule Take 100 mg by mouth 2 (two) times daily. 01/15/22   [provider]  LINZESS  72 MCG capsule Take 72 mcg by mouth daily before breakfast. 01/12/19   [provider]  Multiple Vitamin (MULTIVITAMIN) tablet Take 1 tablet by mouth daily.    [provider]  NARCAN  4 MG/0.1ML LIQD nasal spray kit Place 1 spray into the nose daily as needed (For overdose). 08/09/18   [provider]  oxyCODONE -acetaminophen  (PERCOCET) 10-325 MG tablet Take 1 tablet by mouth every 8 (eight) hours as needed for pain. for pain 03/16/22   [provider]  predniSONE  (DELTASONE ) 20 MG tablet Take 40 mg by mouth daily. Patient not taking: Reported on 09/07/2022 03/09/22   [provider]  PRESCRIPTION MEDICATION Take 1 capsule by mouth 2 (two) times daily. Medication: Sodium Thiosulfate  750 mg    [provider]  rizatriptan (MAXALT) 5 MG tablet Take 10 mg by mouth daily as needed for migraine.    [provider]  rosuvastatin  (CRESTOR ) 20 MG tablet Take 20 mg by mouth daily. 10/07/21   [provider]    Physical Exam: Vitals:   10/18/24 1600 10/18/24 1900 10/18/24 1921 10/18/24 2030  BP:  (!)  147/71  135/69  Pulse: 88 95  88  Resp: (!) 22 17  (!) 26  Temp:   99.9 F (37.7 C)   TempSrc:   Oral   SpO2: 100% 100%  98%  Weight:      Height:       Physical Exam:  General: No acute distress, well developed HEENT: Normocephalic, atraumatic, PERRL Cardiovascular: Normal rate and rhythm. Distal pulses not palpable Pulmonary: Normal pulmonary effort, normal breath sounds Gastrointestinal: Nondistended abdomen, soft, tender bilateral flanks roght more than left, normoactive bowel sounds, no organomegaly Musculoskeletal:left lower medial leg hag beefy red wound w intact healthy margins.  Shkin is hyperpigmented and glossy in foot and lower leg.  No  clear rheumatoid nodules or ulnar deviation seen. MCPs are boggy. Lymphadenopathy: No cervical LAD. Skin: Skin is warm and dry. Neuro: Moves all extremities, AAOx3. PSYCH: Attentive and cooperative  Data Reviewed:  Results for orders placed or performed during the hospital encounter of 10/18/24 (from the past 24 hours)  Comprehensive metabolic panel     Status: Abnormal   Collection Time: 10/18/24  3:57 PM  Result Value Ref Range   Sodium 134 (L) 135 - 145 mmol/L   Potassium 4.2 3.5 - 5.1 mmol/L   Chloride 99 98 - 111 mmol/L   CO2 23 22 - 32 mmol/L   Glucose, Bld 100 (H) 70 - 99 mg/dL   BUN 14 8 - 23 mg/dL   Creatinine, Ser 9.20 0.44 - 1.00 mg/dL   Calcium  9.9 8.9 - 10.3 mg/dL   Total Protein 8.5 (H) 6.5 - 8.1 g/dL   Albumin 3.9 3.5 - 5.0 g/dL   AST 23 15 - 41 U/L   ALT 9 0 - 44 U/L   Alkaline Phosphatase 93 38 - 126 U/L   Total Bilirubin 0.5 0.0 - 1.2 mg/dL   GFR, Estimated >39 >39 mL/min   Anion gap 11 5 - 15  CBC with Differential     Status: Abnormal   Collection Time: 10/18/24  3:57 PM  Result Value Ref Range   WBC 9.2 4.0 - 10.5 K/uL   RBC 3.74 (L) 3.87 - 5.11 MIL/uL   Hemoglobin 10.1 (L) 12.0 - 15.0 g/dL   HCT 68.1 (L) 63.9 - 53.9 %   MCV 85.0 80.0 - 100.0 fL   MCH 27.0 26.0 - 34.0 pg   MCHC 31.8 30.0 - 36.0  g/dL   RDW 85.2 88.4 - 84.4 %   Platelets 307 150 - 400 K/uL   nRBC 0.0 0.0 - 0.2 %   Neutrophils Relative % 81 %   Neutro Abs 7.4 1.7 - 7.7 K/uL   Lymphocytes Relative 12 %   Lymphs Abs 1.1 0.7 - 4.0 K/uL   Monocytes Relative 7 %   Monocytes Absolute 0.6 0.1 - 1.0 K/uL   Eosinophils Relative 0 %   Eosinophils Absolute 0.0 0.0 - 0.5 K/uL   Basophils Relative 0 %   Basophils Absolute 0.0 0.0 - 0.1 K/uL   Immature Granulocytes 0 %   Abs Immature Granulocytes 0.04 0.00 - 0.07 K/uL  Protime-INR     Status: None   Collection Time: 10/18/24  3:57 PM  Result Value Ref Range   Prothrombin Time 13.8 11.4 - 15.2 seconds   INR 1.0 0.8 - 1.2  Blood Culture (routine x 2)     Status: None (Preliminary result)   Collection Time: 10/18/24  3:57 PM   Specimen: BLOOD RIGHT ARM  Result Value Ref Range   Specimen Description      BLOOD RIGHT ARM Performed at [facility], [address] [place], [place] [address]    Special Requests      BOTTLES DRAWN AEROBIC AND ANAEROBIC Blood Culture adequate volume Performed at [facility], [address] [place], [place] [address]    Culture PENDING    Report Status PENDING   Urinalysis, w/ Reflex to Culture (Infection Suspected) -Urine, Clean Catch     Status: Abnormal   Collection Time: 10/18/24  3:57 PM  Result Value Ref Range   Specimen Source URINE, CLEAN CATCH    Color, Urine YELLOW YELLOW   APPearance CLEAR CLEAR   Specific Gravity, Urine  1.011 1.005 - 1.030   pH 8.0 5.0 - 8.0   Glucose, UA NEGATIVE NEGATIVE mg/dL   Hgb urine dipstick NEGATIVE NEGATIVE   Bilirubin Urine NEGATIVE NEGATIVE   Ketones, ur NEGATIVE NEGATIVE mg/dL   Protein, ur NEGATIVE NEGATIVE mg/dL   Nitrite NEGATIVE NEGATIVE   Leukocytes,Ua TRACE (A) NEGATIVE   RBC / HPF 0-5 0 - 5 RBC/hpf   WBC, UA 21-50 0 - 5 WBC/hpf   Bacteria, UA FEW (A) NONE SEEN   Squamous Epithelial / HPF 0-5 0 - 5 /HPF  CK     Status: None   Collection Time: 10/18/24   3:57 PM  Result Value Ref Range   Total CK 50 38 - 234 U/L  Urine Culture     Status: None (Preliminary result)   Collection Time: 10/18/24  3:57 PM   Specimen: Urine, Clean Catch  Result Value Ref Range   Specimen Description      URINE, CLEAN CATCH Performed at [facility], [address] [place], [place] [address]    Special Requests      NONE Reflexed from [id number] Performed at [facility], [address], [place], [place] [address]    Culture PENDING    Report Status PENDING   I-Stat Lactic Acid, ED     Status: None   Collection Time: 10/18/24  4:09 PM  Result Value Ref Range   Lactic Acid, Venous 1.2 0.5 - 1.9 mmol/L  TSH     Status: None   Collection Time: 10/18/24  4:55 PM  Result Value Ref Range   TSH 0.448 0.350 - 4.500 uIU/mL  Resp panel by RT-PCR (RSV, Flu A&B, Covid) Anterior Nasal Swab     Status: None   Collection Time: 10/18/24  4:55 PM   Specimen: Anterior Nasal Swab  Result Value Ref Range   SARS Coronavirus 2 by RT PCR NEGATIVE NEGATIVE   Influenza A by PCR NEGATIVE NEGATIVE   Influenza B by PCR NEGATIVE NEGATIVE   Resp Syncytial Virus by PCR NEGATIVE NEGATIVE     Assessment and Plan: 68 year old with lupus and polymyalgia rheumatica who presents with right-sided pyelonephritis.  She is status post skin graft 2 weeks ago and she does have a history of MRSA. - The patient will be started on Rocephin  for for pyelonephritis.  Urine and blood cultures are pending.  The left leg wound looks good.  2.  Left leg wound -wound care consult  3. polymyalgia rheumatica/lupus -the patient says she used to have these medical problems but does not have them any longer.  4.  Rheumatoid arthritis - she does currently have rheumatoid arthritis but is not taking any medication for this and is not seeing any specialist.  5. DMT2 is also mentioned in her medical history -she is on no medication check hemoglobin A1c in a.m. will would like to  confirm this diagnosis before ordering Accu-Cheks.  6.  CT of abdomen -choledocholithiasis without evidence of cholecystitis.    Advance Care Planning:   Code Status: Prior the patient names her daughter Randine as her surrogate decision maker and she wants to be DNR.  Consults: none  Family Communication: none  Severity of Illness: The appropriate patient status for this patient is INPATIENT. Inpatient status is judged to be reasonable and necessary in order to provide the required intensity of service to ensure the patient's safety. The patient's presenting symptoms, physical exam findings, and initial radiographic and laboratory data in the context  of their chronic comorbidities is felt to place them at high risk for further clinical deterioration. Furthermore, it is not anticipated that the patient will be medically stable for discharge from the hospital within 2 midnights of admission.   * I certify that at the point of admission it is my clinical judgment that the patient will require inpatient hospital care spanning beyond 2 midnights from the point of admission due to high intensity of service, high risk for further deterioration and high frequency of surveillance required.*  Author: ARTHEA CHILD, MD 10/18/2024 8:51 PM  For on call review [email or web address].

## 2024-10-18 NOTE — Plan of Care (Signed)

## 2024-10-18 NOTE — ED Provider Notes (Signed)
 [place] EMERGENCY DEPARTMENT AT [facility] Provider Note   CSN: [id number] Arrival date & time: 10/18/24  1511     Patient presents with: Blood Infection   Diana Sanchez is a 68 y.o. female.   HPI    Patient presents because of full body aches.  Concern for possible infection.  Endorsing chills.  Is been present since approximately last Thursday.  Has been having increasing abdominal pain as well.  Mostly on the right side.  Endorsing nausea but no vomiting.  Bowel moods been regular.  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  Patient states that she has been following up with wound clinic for this.  Denies any discharge from this area.  No redness.  Endorses just generalized bodyaches.  Usually is able to walk on her own at home but currently not able to walk because of generalized bodyaches and fatigue.  No obvious dysuria.  No sick contacts that she is aware of.   Previous medical history reviewed : Patient was last admitted 2023.  Patient was admitted due to chronic skin ulcer of lower leg.  Patient follows up with acute for venous stasis ulcer of left lower leg with edema of left lower leg.   Prior to Admission medications   Medication Sig Start Date End Date Taking? Authorizing Provider  albuterol  (PROVENTIL  HFA;VENTOLIN  HFA) 108 (90 Base) MCG/ACT inhaler Inhale 1-2 puffs into the lungs every 6 (six) hours as needed for wheezing or shortness of breath. 02/14/12   [provider]  Alcohol Swabs (ALCOHOL WIPES) 70 % PADS by Does not apply route. 04/04/20   [provider]  amoxicillin -clavulanate (AUGMENTIN ) 875-125 MG tablet Take 1 tablet by mouth every 12 (twelve) hours. 09/07/22   Randol Simmonds, MD  Ascorbic Acid  (VITAMIN C  PO) Take 1 tablet by mouth daily.    [provider]  aspirin  EC 81 MG tablet Take 81 mg by mouth daily.    [provider]  Blood Glucose Monitoring Suppl (ACCU-CHEK GUIDE) w/Device KIT  by Does not apply route. 04/04/20   [provider]  clobetasol ointment (TEMOVATE) 0.05 % Apply 1 application  topically every other day. 03/09/22   [provider]  [employer] ACETAMINOPHEN  EX ST 500 MG tablet Take 500 mg by mouth 2 (two) times daily as needed for moderate pain. 03/06/22   [provider]  cyclobenzaprine  (FLEXERIL ) 10 MG tablet Take 10 mg by mouth 3 (three) times daily as needed for muscle spasms.    [provider]  doxycycline  (VIBRAMYCIN ) 100 MG capsule Take 1 capsule by mouth 2 (two) times daily. 09/02/22   [provider]  furosemide  (LASIX ) 40 MG tablet Take 1 tablet (40 mg total) by mouth daily as needed for fluid or edema. 07/02/22 07/02/23  Gonfa, Taye T, MD  gabapentin  (NEURONTIN ) 100 MG capsule Take 100 mg by mouth 2 (two) times daily. 01/15/22   [provider]  LINZESS  72 MCG capsule Take 72 mcg by mouth daily before breakfast. 01/12/19   [provider]  Multiple Vitamin (MULTIVITAMIN) tablet Take 1 tablet by mouth daily.    [provider]  NARCAN  4 MG/0.1ML LIQD nasal spray kit Place 1 spray into the nose daily as needed (For overdose). 08/09/18   [provider]  oxyCODONE -acetaminophen  (PERCOCET) 10-325 MG tablet Take 1 tablet by mouth every 8 (eight) hours as needed for pain. for pain 03/16/22   [provider]  predniSONE  (DELTASONE )  20 MG tablet Take 40 mg by mouth daily. Patient not taking: Reported on 09/07/2022 03/09/22   [provider]  PRESCRIPTION MEDICATION Take 1 capsule by mouth 2 (two) times daily. Medication: Sodium Thiosulfate  750 mg    [provider]  rizatriptan (MAXALT) 5 MG tablet Take 10 mg by mouth daily as needed for migraine.    [provider]  rosuvastatin  (CRESTOR ) 20 MG tablet Take 20 mg by mouth daily. 10/07/21   [provider]    Allergies: Tramadol    Review of Systems  Constitutional:  Negative for chills and fever.   HENT:  Negative for ear pain and sore throat.   Eyes:  Negative for pain and visual disturbance.  Respiratory:  Negative for cough and shortness of breath.   Cardiovascular:  Negative for chest pain and palpitations.  Gastrointestinal:  Negative for abdominal pain and vomiting.  Genitourinary:  Negative for dysuria and hematuria.  Musculoskeletal:  Negative for arthralgias and back pain.  Skin:  Negative for color change and rash.  Neurological:  Negative for seizures and syncope.  All other systems reviewed and are negative.   Updated Vital Signs BP (!) 147/71   Pulse 95   Temp 99.9 F (37.7 C) (Oral)   Resp 17   Ht 5' 11 (1.803 m)   Wt 86.2 kg   SpO2 100%   BMI 26.50 kg/m   Physical Exam Vitals and nursing note reviewed.  Constitutional:      General: She is not in acute distress.    Appearance: She is well-developed.  HENT:     Head: Normocephalic and atraumatic.  Eyes:     Conjunctiva/sclera: Conjunctivae normal.  Cardiovascular:     Rate and Rhythm: Normal rate and regular rhythm.     Heart sounds: No murmur heard. Pulmonary:     Effort: Pulmonary effort is normal. No respiratory distress.     Breath sounds: Normal breath sounds.  Abdominal:     Palpations: Abdomen is soft.     Tenderness: There is no abdominal tenderness.  Musculoskeletal:        General: No swelling.     Cervical back: Neck supple.  Skin:    General: Skin is warm and dry.     Capillary Refill: Capillary refill takes less than 2 seconds.      Neurological:     Mental Status: She is alert.  Psychiatric:        Mood and Affect: Mood normal.     (all labs ordered are listed, but only abnormal results are displayed) Labs Reviewed  COMPREHENSIVE METABOLIC PANEL WITH GFR - Abnormal; Notable for the following components:      Result Value   Sodium 134 (*)    Glucose, Bld 100 (*)    Total Protein 8.5 (*)    All other components within normal limits  CBC WITH DIFFERENTIAL/PLATELET -  Abnormal; Notable for the following components:   RBC 3.74 (*)    Hemoglobin 10.1 (*)    HCT 31.8 (*)    All other components within normal limits  URINALYSIS, W/ REFLEX TO CULTURE (INFECTION SUSPECTED) - Abnormal; Notable for the following components:   Leukocytes,Ua TRACE (*)    Bacteria, UA FEW (*)    All other components within normal limits  RESP PANEL BY RT-PCR (RSV, FLU A&B, COVID)  RVPGX2  CULTURE, BLOOD (ROUTINE X 2)  CULTURE, BLOOD (ROUTINE X 2)  URINE CULTURE  PROTIME-INR  CK  TSH  I-STAT  CG4 LACTIC ACID, ED    EKG: EKG Interpretation Date/Time:  Thursday October 18 2024 15:27:59 EST Ventricular Rate:  93 PR Interval:  201 QRS Duration:  86 QT Interval:  335 QTC Calculation: 417 R Axis:   75  Text Interpretation: Sinus rhythm Probable left atrial enlargement Confirmed by Simon Rea [phone]) on 10/18/2024 3:31:34 PM  Radiology: CT ABDOMEN PELVIS W CONTRAST Result Date: 10/18/2024 CLINICAL DATA:  Right lower quadrant pain, right upper quadrant pain, fever and chills, questionable sepsis, concern for pulmonary embolus EXAM: CT ANGIOGRAPHY CHEST CT ABDOMEN AND PELVIS WITH CONTRAST TECHNIQUE: Multidetector CT imaging of the chest was performed using the standard protocol during bolus administration of intravenous contrast. Multiplanar CT image reconstructions and MIPs were obtained to evaluate the vascular anatomy. Multidetector CT imaging of the abdomen and pelvis was performed using the standard protocol during bolus administration of intravenous contrast. RADIATION DOSE REDUCTION: This exam was performed according to the departmental dose-optimization program which includes automated exposure control, adjustment of the mA and/or kV according to patient size and/or use of iterative reconstruction technique. CONTRAST:  OMNIPAQUE  IOHEXOL  350 MG/ML SOLN COMPARISON:  10/18/2024, 03/29/2024 FINDINGS: CTA CHEST FINDINGS Cardiovascular: This is a technically adequate  evaluation of the pulmonary vasculature. No filling defects or pulmonary emboli. The heart is unremarkable without pericardial effusion. No evidence of thoracic aortic aneurysm or dissection. Atherosclerosis of the aorta and coronary vasculature. Mediastinum/Nodes: Thyroid, trachea, and esophagus are unremarkable. Bilateral axillary lymphadenopathy, measuring up to 13 mm on the left and 10 mm on the right. No mediastinal or hilar adenopathy. Lungs/Pleura: No acute airspace disease, effusion, or pneumothorax. The central airways are patent. Musculoskeletal: No acute or destructive bony abnormalities. Reconstructed images demonstrate no additional findings. Review of the MIP images confirms the above findings. CT ABDOMEN and PELVIS FINDINGS Hepatobiliary: Multiple calcified gallstones layer dependently within the gallbladder. No evidence of acute cholecystitis. Stable appearance of the liver. No biliary duct dilation or choledocholithiasis. Pancreas: Unremarkable. No pancreatic ductal dilatation or surrounding inflammatory changes. Spleen: Normal in size without focal abnormality. Adrenals/Urinary Tract: Assessment of the distal ureters and bladder is limited by streak artifact from bilateral hip arthroplasties. The bladder is decompressed. There is fat stranding surrounding the left renal pelvis and proximal left ureter, with proximal left ureteral wall thickening and mucosal hyperenhancement, consistent with urinary tract infection. Slightly heterogeneous enhancement of the left renal parenchyma, most pronounced on delayed imaging, consistent with pyelonephritis. No renal abscess. The right kidney is unremarkable.  The adrenals are normal. Stomach/Bowel: No bowel obstruction or ileus. Normal appendix right lower quadrant. No bowel wall thickening or inflammatory change. Vascular/Lymphatic: Stable atherosclerosis of the abdominal aorta. There is continued lymphadenopathy throughout the abdomen and pelvis, not  appreciably changed since prior study. Index right external iliac chain lymph node measures 11 mm image 54/2 and left external iliac chain lymph node 19 mm image 57/2, stable. Reproductive: Uterus and bilateral adnexa are unremarkable. Other: No free fluid or free intraperitoneal gas. Small fat containing umbilical hernia. No bowel herniation. Musculoskeletal: Bilateral hip arthroplasties. No acute or destructive bony abnormalities. Reconstructed images demonstrate no additional findings. Review of the MIP images confirms the above findings. IMPRESSION: 1. Findings consistent with left-sided pyelonephritis and ascending urinary tract infection. No evidence of renal abscess. 2. Lymphadenopathy within the bilateral axillary regions, abdomen, and pelvis, concerning for lymphoproliferative disorder. No significant change in the abdominal and pelvic adenopathy since prior study. 3. Cholelithiasis without evidence of cholecystitis. 4. No evidence of pulmonary embolus. 5.  Aortic Atherosclerosis (ICD10-I70.0). Electronically Signed   By: Ozell Daring M.D.   On: 10/18/2024 17:44   CT Angio Chest PE W and/or Wo Contrast Result Date: 10/18/2024 CLINICAL DATA:  Right lower quadrant pain, right upper quadrant pain, fever and chills, questionable sepsis, concern for pulmonary embolus EXAM: CT ANGIOGRAPHY CHEST CT ABDOMEN AND PELVIS WITH CONTRAST TECHNIQUE: Multidetector CT imaging of the chest was performed using the standard protocol during bolus administration of intravenous contrast. Multiplanar CT image reconstructions and MIPs were obtained to evaluate the vascular anatomy. Multidetector CT imaging of the abdomen and pelvis was performed using the standard protocol during bolus administration of intravenous contrast. RADIATION DOSE REDUCTION: This exam was performed according to the departmental dose-optimization program which includes automated exposure control, adjustment of the mA and/or kV according to patient size  and/or use of iterative reconstruction technique. CONTRAST:  OMNIPAQUE  IOHEXOL  350 MG/ML SOLN COMPARISON:  10/18/2024, 03/29/2024 FINDINGS: CTA CHEST FINDINGS Cardiovascular: This is a technically adequate evaluation of the pulmonary vasculature. No filling defects or pulmonary emboli. The heart is unremarkable without pericardial effusion. No evidence of thoracic aortic aneurysm or dissection. Atherosclerosis of the aorta and coronary vasculature. Mediastinum/Nodes: Thyroid, trachea, and esophagus are unremarkable. Bilateral axillary lymphadenopathy, measuring up to 13 mm on the left and 10 mm on the right. No mediastinal or hilar adenopathy. Lungs/Pleura: No acute airspace disease, effusion, or pneumothorax. The central airways are patent. Musculoskeletal: No acute or destructive bony abnormalities. Reconstructed images demonstrate no additional findings. Review of the MIP images confirms the above findings. CT ABDOMEN and PELVIS FINDINGS Hepatobiliary: Multiple calcified gallstones layer dependently within the gallbladder. No evidence of acute cholecystitis. Stable appearance of the liver. No biliary duct dilation or choledocholithiasis. Pancreas: Unremarkable. No pancreatic ductal dilatation or surrounding inflammatory changes. Spleen: Normal in size without focal abnormality. Adrenals/Urinary Tract: Assessment of the distal ureters and bladder is limited by streak artifact from bilateral hip arthroplasties. The bladder is decompressed. There is fat stranding surrounding the left renal pelvis and proximal left ureter, with proximal left ureteral wall thickening and mucosal hyperenhancement, consistent with urinary tract infection. Slightly heterogeneous enhancement of the left renal parenchyma, most pronounced on delayed imaging, consistent with pyelonephritis. No renal abscess. The right kidney is unremarkable.  The adrenals are normal. Stomach/Bowel: No bowel obstruction or ileus. Normal appendix right  lower quadrant. No bowel wall thickening or inflammatory change. Vascular/Lymphatic: Stable atherosclerosis of the abdominal aorta. There is continued lymphadenopathy throughout the abdomen and pelvis, not appreciably changed since prior study. Index right external iliac chain lymph node measures 11 mm image 54/2 and left external iliac chain lymph node 19 mm image 57/2, stable. Reproductive: Uterus and bilateral adnexa are unremarkable. Other: No free fluid or free intraperitoneal gas. Small fat containing umbilical hernia. No bowel herniation. Musculoskeletal: Bilateral hip arthroplasties. No acute or destructive bony abnormalities. Reconstructed images demonstrate no additional findings. Review of the MIP images confirms the above findings. IMPRESSION: 1. Findings consistent with left-sided pyelonephritis and ascending urinary tract infection. No evidence of renal abscess. 2. Lymphadenopathy within the bilateral axillary regions, abdomen, and pelvis, concerning for lymphoproliferative disorder. No significant change in the abdominal and pelvic adenopathy since prior study. 3. Cholelithiasis without evidence of cholecystitis. 4. No evidence of pulmonary embolus. 5.  Aortic Atherosclerosis (ICD10-I70.0). Electronically Signed   By: Ozell Daring M.D.   On: 10/18/2024 17:44   DG Chest Port 1 View Result Date: 10/18/2024 CLINICAL DATA:  Questionable sepsis. EXAM: PORTABLE CHEST 1 VIEW COMPARISON:  Chest radiograph dated 09/07/2022. FINDINGS: No focal consolidation, pleural effusion or pneumothorax. The cardiac silhouette is within normal limits. No acute osseous pathology. IMPRESSION: No active disease. Electronically Signed   By: Vanetta Chou M.D.   On: 10/18/2024 16:43     Procedures   Medications Ordered in the ED  sodium chloride  0.9 % bolus 500 mL (has no administration in time range)  iohexol  (OMNIPAQUE ) 350 MG/ML injection 100 mL (100 mLs Intravenous Contrast Given 10/18/24 1707)  ceFEPIme  (MAXIPIME) 2 g in sodium chloride  0.9 % 100 mL IVPB (0 g Intravenous Stopped 10/18/24 1933)  oxyCODONE  (Oxy IR/ROXICODONE ) immediate release tablet 5 mg (5 mg Oral Given 10/18/24 1933)                                    Medical Decision Making Amount and/or Complexity of Data Reviewed Labs: ordered. Radiology: ordered.  Risk Prescription drug management.     HPI:     Patient presents because of full body aches.  Concern for possible infection.  Endorsing chills.  Is been present since approximately last Thursday.  Has been having increasing abdominal pain as well.  Mostly on the right side.  Endorsing nausea but no vomiting.  Bowel moods been regular.  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  Patient states that she has been following up with wound clinic for this.  Denies any discharge from this area.  No redness.  Endorses just generalized bodyaches.  Usually is able to walk on her own at home but currently not able to walk because of generalized bodyaches and fatigue.  No obvious dysuria.  No sick contacts that she is aware of.   Previous medical history reviewed : Patient was last admitted 2023.  Patient was admitted due to chronic skin ulcer of lower leg.  Patient follows up with acute for venous stasis ulcer of left lower leg with edema of left lower leg.   MDM:   Upon exam, patient ANO x 3 GCS 15.  Patient endorsing chills.  Patient vital signs show slight tachypnea.  Maps appropriate.  Afebrile.  Patient does not have a history of DVT.  Therefore, will obtain CTA chest rule out PE.  Also obtain CT abdomen to rule out any kind evidence of cholecystitis or appendicitis based off of right-sided abdominal pain.  Obtain CK given diffuse pain.  Rule out rhabdo. Patient skin wound does not look like it is infected this time.  No obvious surrounding erythema or cellulitic changes or any discharge.   Reevaluation:   Upon reexamination, patient hemodynamically  stable.  Remains A&O x 3 with GCS 15.  No leukocytosis.  Negative lactic acid.  No large electrolyte derangements.   CT scan is consistent for pyelonephritis on the left.  No abscess.  UA shows leuk esterase and few bacteria.  Urine culture sent.  Patient has been started on cefepime already.  500 cc bolus ordered.  Ck Normal.  Otherwise, patient remained stable.  No concern for pyelonephritis, patient be admitted for further care.  Interventions: cefepime   EKG Interpreted by Me: nsr   Cardiac Tele Interpreted by Me: nsr   I have independently interpreted the CT  images and agree with the radiologist finding   Social Determinant of Health: chronic pain   Disposition and Follow Up: admit       Final diagnoses:  Pyelonephritis  Acute  generalized body pain    ED Discharge Orders     None          Simon Lavonia SAILOR, MD 10/18/24 1941

## 2024-10-18 NOTE — ED Triage Notes (Signed)
 Pt BIB [facility] from home for R calf warmth, fever, chills.  CBG 120 102HR 122/53

## 2024-10-18 NOTE — ED Notes (Signed)
 Difficult IV stick. US  IV in progress.

## 2024-10-19 DIAGNOSIS — N12 Tubulo-interstitial nephritis, not specified as acute or chronic: Secondary | ICD-10-CM

## 2024-10-19 DIAGNOSIS — R52 Pain, unspecified: Secondary | ICD-10-CM

## 2024-10-19 DIAGNOSIS — N10 Acute tubulo-interstitial nephritis: Secondary | ICD-10-CM | POA: Diagnosis not present

## 2024-10-19 LAB — BLOOD CULTURE ID PANEL (REFLEXED) - BCID2

## 2024-10-19 LAB — BASIC METABOLIC PANEL WITH GFR
Anion gap: 14 (ref 5–15)
BUN: 12 mg/dL (ref 8–23)
CO2: 18 mmol/L — ABNORMAL LOW (ref 22–32)
Calcium: 9.1 mg/dL (ref 8.9–10.3)
Chloride: 100 mmol/L (ref 98–111)
Creatinine, Ser: 0.77 mg/dL (ref 0.44–1.00)
GFR, Estimated: >60 mL/min (ref >60)
Glucose, Bld: 90 mg/dL (ref 70–99)
Potassium: 3.9 mmol/L (ref 3.5–5.1)
Sodium: 132 mmol/L — ABNORMAL LOW (ref 135–145)

## 2024-10-19 LAB — CBC
HCT: 34.6 % — ABNORMAL LOW (ref 36.0–46.0)
Hemoglobin: 10.2 g/dL — ABNORMAL LOW (ref 12.0–15.0)
MCH: 26.2 pg (ref 26.0–34.0)
MCHC: 29.5 g/dL — ABNORMAL LOW (ref 30.0–36.0)
MCV: 88.7 fL (ref 80.0–100.0)
Platelets: 305 K/uL (ref 150–400)
RBC: 3.9 MIL/uL (ref 3.87–5.11)
RDW: 15.1 % (ref 11.5–15.5)
WBC: 9.3 K/uL (ref 4.0–10.5)
nRBC: 0 % (ref 0.0–0.2)

## 2024-10-19 LAB — HIV ANTIBODY (ROUTINE TESTING W REFLEX): HIV Screen 4th Generation wRfx: NONREACTIVE

## 2024-10-19 LAB — HEMOGLOBIN A1C
Hgb A1c MFr Bld: 5.7 % — ABNORMAL HIGH (ref 4.8–5.6)
Mean Plasma Glucose: 116.89 mg/dL

## 2024-10-19 MED ORDER — TRIAMCINOLONE ACETONIDE 0.1 % EX OINT
TOPICAL_OINTMENT | CUTANEOUS | Status: DC
  Filled 2024-10-19: qty 80

## 2024-10-19 MED ORDER — ENOXAPARIN SODIUM 40 MG/0.4ML IJ SOSY
40.0000 mg | PREFILLED_SYRINGE | INTRAMUSCULAR | Status: DC
  Administered 2024-10-19 – 2024-10-22 (×4): 40 mg via SUBCUTANEOUS
  Filled 2024-10-19 (×4): qty 0.4

## 2024-10-19 MED ORDER — OXYCODONE HCL 5 MG PO TABS
5.0000 mg | ORAL_TABLET | Freq: Four times a day (QID) | ORAL | Status: DC | PRN
  Administered 2024-10-19 – 2024-10-22 (×3): 5 mg via ORAL
  Filled 2024-10-19 (×3): qty 1

## 2024-10-19 MED ORDER — TRIAMCINOLONE ACETONIDE 0.1 % EX LOTN: TOPICAL_LOTION | CUTANEOUS | Status: DC

## 2024-10-19 MED ORDER — TRIAMCINOLONE ACETONIDE 0.1 % EX LOTN: TOPICAL_LOTION | Freq: Every day | CUTANEOUS | Status: DC

## 2024-10-19 NOTE — Consult Note (Addendum)
 WOC Nurse Consult Note:  WOC consult performed remotely utilizing imaging and chart review   Reason for Consult:left lower leg wound s/p skin graft  Wound type: full thickness venous stasis ulcer, to left lower leg,  patient follows up with [facility] for wound management, documents reviewed, patient uses prisma AG, Hydrofera blue, and Urgo K2 wrap which are not on hospital formulary. Will substitute for closest equivalent on formulary. Pressure Injury POA: No Measurement: see nursing flow sheets Wound bed: 50 % pink 50% yellow Drainage (amount, consistency, odor) see nursing flow sheets Periwound: intact Dressing procedure/placement/frequency:  Cleanse wound with Vashe (lawson # [id number]) allow Vashe moistened gauze to soak in wound bed for 10 minutes.  Apply triamcinolone cream to intact skin of leg. Apply Aquacel AG (lawson # [id number]) to wound bed, cover with silicone foam dressing  and wrap with Kerlix and Coban.  Change twice weekly.     WOC team will not follow patient at this time.  Please re consult if new needs arise or wound deteriorates.   Thank you,  Doyal Polite, MSN, RN, [facility] WOC Team [phone] (Available Mon-Fri 0700-1500)

## 2024-10-19 NOTE — Care Management CC44 (Signed)
 Condition Code 44 Documentation Completed  Patient Details  Name: Diana Sanchez MRN: [id number] Date of Birth: 01/16/1956   Condition Code 44 given:  Yes Patient signature on Condition Code 44 notice:  Yes Documentation of 2 MD's agreement:  Yes Code 44 added to claim:  Yes    [name], LCSW 10/19/2024, 3:30 PM

## 2024-10-19 NOTE — Progress Notes (Signed)
 PHARMACY - PHYSICIAN COMMUNICATION CRITICAL VALUE ALERT - BLOOD CULTURE IDENTIFICATION (BCID)  Diana Sanchez is an 68 y.o. female who presented to [facility] on 10/18/2024 with a chief complaint of fever & chills.  Assessment:   Admit with pyelonephritis per CT scan.  Urine cx + Ecoli, BCx now also growing GNR, BCID + EColi, no resistance.    Name of physician (or Provider) Contacted: JINNY Kipper, NP  Current antibiotics: Rocephin  2gm IV q24h  Changes to prescribed antibiotics recommended:  Patient is on recommended antibiotics - No changes needed  Results for orders placed or performed during the hospital encounter of 10/18/24  Blood Culture ID Panel (Reflexed) (Collected: 10/18/2024  3:57 PM)  Result Value Ref Range   Enterococcus faecalis NOT DETECTED NOT DETECTED   Enterococcus Faecium NOT DETECTED NOT DETECTED   Listeria monocytogenes NOT DETECTED NOT DETECTED   Staphylococcus species NOT DETECTED NOT DETECTED   Staphylococcus aureus (BCID) NOT DETECTED NOT DETECTED   Staphylococcus epidermidis NOT DETECTED NOT DETECTED   Staphylococcus lugdunensis NOT DETECTED NOT DETECTED   Streptococcus species NOT DETECTED NOT DETECTED   Streptococcus agalactiae NOT DETECTED NOT DETECTED   Streptococcus pneumoniae NOT DETECTED NOT DETECTED   Streptococcus pyogenes NOT DETECTED NOT DETECTED   A.calcoaceticus-baumannii NOT DETECTED NOT DETECTED   Bacteroides fragilis NOT DETECTED NOT DETECTED   Enterobacterales DETECTED (A) NOT DETECTED   Enterobacter cloacae complex NOT DETECTED NOT DETECTED   Escherichia coli DETECTED (A) NOT DETECTED   Klebsiella aerogenes NOT DETECTED NOT DETECTED   Klebsiella oxytoca NOT DETECTED NOT DETECTED   Klebsiella pneumoniae NOT DETECTED NOT DETECTED   Proteus species NOT DETECTED NOT DETECTED   Salmonella species NOT DETECTED NOT DETECTED   Serratia marcescens NOT DETECTED NOT DETECTED   Haemophilus influenzae NOT DETECTED NOT DETECTED    Neisseria meningitidis NOT DETECTED NOT DETECTED   Pseudomonas aeruginosa NOT DETECTED NOT DETECTED   Stenotrophomonas maltophilia NOT DETECTED NOT DETECTED   Candida albicans NOT DETECTED NOT DETECTED   Candida auris NOT DETECTED NOT DETECTED   Candida glabrata NOT DETECTED NOT DETECTED   Candida krusei NOT DETECTED NOT DETECTED   Candida parapsilosis NOT DETECTED NOT DETECTED   Candida tropicalis NOT DETECTED NOT DETECTED   Cryptococcus neoformans/gattii NOT DETECTED NOT DETECTED   CTX-M ESBL NOT DETECTED NOT DETECTED   Carbapenem resistance IMP NOT DETECTED NOT DETECTED   Carbapenem resistance KPC NOT DETECTED NOT DETECTED   Carbapenem resistance NDM NOT DETECTED NOT DETECTED   Carbapenem resist OXA 48 LIKE NOT DETECTED NOT DETECTED   Carbapenem resistance VIM NOT DETECTED NOT DETECTED    Rosaline Millet PharmD 10/19/2024  10:42 PM

## 2024-10-19 NOTE — Progress Notes (Signed)
 [facility]                                                                              [place], is a 68 y.o. female, DOB - 27-Jul-1956, FMW:[id number] Admit date - 10/18/2024    Outpatient Primary MD for the patient is Delores Rojelio Caldron, NP  LOS - 1  days  Chief Complaint  Patient presents with   Fever   Generalized Body Aches       Brief summary   Patient is a 68 year old female with history of lupus, PMR, lymphedema, venous stasis with chronic venous stasis ulcer on the left lower leg for many years, follows with plastic surgery at [facility], had skin graft placed 2 weeks ago on 10/14.  Per patient, this was outpatient procedure and did not require Foley catheter.  A week ago she felt fever and chills with myalgias, dysuria, increased urinary frequency.  She has been incontinent of urine for the last week which is not her baseline.  No nausea vomiting but poor appetite.  Has been drinking only liquids.  CT chest abdomen pelvis in ED showed left-sided pyelonephritis and ascending UTI, no renal abscess.  Lymphadenopathy with bilateral axillary regions, abdominal pelvis concerning for lymphoproliferative disorder, no significant change in the abdominal and pelvic adenopathy since prior study.  Cholelithiasis without evidence of cholecystitis.  Assessment & Plan      Acute left-sided pyelonephritis, UTI -Follow urine culture sensitivities, blood cultures - Continue IV Rocephin   Chronic left leg wound, chronic venous stasis ulcer - Recent skin graft, follows [facility] plastic surgery - Wound care consulted   History of PMR, rheumatoid arthritis History of lupus -Outpatient follow-up with PCP, has followed [facility] rheumatology prior to us   History of diabetes mellitus type 2, diet controlled - Hemoglobin A1c 5.7, CBGs controlled, not on any oral hypoglycemics  Anemia of chronic disease - H&H appears close to baseline 10-11   Lymphadenopathy in the bilateral  axillary regions, abdomen and pelvis on CT abdomen  -CT findings concerning for lymphoproliferative disorder, no significant change in abdominal and pelvic adenopathy since prior study.   - Also previously seen on the CT abdomen on 03/29/2024 and 01/06/2023.  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  - Discussed with the patient, patient reports that she has never had any evaluation with hematology/oncology.  Discussed with on-call oncology, Dr. Lonn   Estimated body mass index is 26.5 kg/m as calculated from the following:   Height as of this encounter: 5' 11 (1.803 m).   Weight as of this encounter: 86.2 kg.  Code Status: DNR DVT Prophylaxis:     Level of Care: Level of care: Telemetry Family Communication: Updated patient Disposition Plan:      Remains inpatient appropriate:      Procedures:    Consultants:   Oncology  Antimicrobials:   Anti-infectives (From admission, onward)    Start     Dose/Rate Route Frequency Ordered Stop   10/19/24 0500  cefTRIAXone  (ROCEPHIN ) 2 g in sodium chloride  0.9 %  100 mL IVPB        2 g 200 mL/hr over 30 Minutes Intravenous Every 24 hours 10/18/24 2135     10/18/24 1815  ceFEPIme (MAXIPIME) 2 g in sodium chloride  0.9 % 100 mL IVPB        2 g 200 mL/hr over 30 Minutes Intravenous  Once 10/18/24 1812 10/18/24 1933          Medications  ascorbic acid   500 mg Oral Daily   feeding supplement  237 mL Oral BID BM   melatonin  5 mg Oral QHS      Subjective:   Derika Eckles was seen and examined today.  Feeling weak, no acute fevers, chest pain, shortness of breath, nausea or vomiting.    Objective:   Vitals:   10/18/24 2305 10/19/24 0303 10/19/24 0544 10/19/24 0856  BP: 138/72 138/74 123/70 129/61  Pulse: 82 83 78 82  Resp: 16 16 20 16   Temp: 97.9 F (36.6 C) 98.3 F (36.8 C)  98.3 F (36.8 C) 97.8 F (36.6 C)  TempSrc: Oral Oral Oral Oral  SpO2: 100% 98% 98% 100%  Weight:      Height:        Intake/Output Summary (Last 24 hours) at 10/19/2024 0933 Last data filed at 10/19/2024 0917 Gross per 24 hour  Intake 1154.94 ml  Output 200 ml  Net 954.94 ml     Wt Readings from Last 3 Encounters:  10/18/24 86.2 kg  09/07/22 86.2 kg  03/30/22 87.8 kg     Exam General: Alert and oriented x 3, NAD Cardiovascular: S1 S2 auscultated,  RRR Respiratory: Clear to auscultation bilaterally, no wheezing Gastrointestinal: Soft, nontender, nondistended, + bowel sounds Ext: no pedal edema bilaterally Neuro: No new deficits Skin: Dressing intact on the left lower extremity Psych: Normal affect     Data Reviewed:  I have personally reviewed following labs    CBC Lab Results  Component Value Date   WBC 9.3 10/19/2024   RBC 3.90 10/19/2024   HGB 10.2 (L) 10/19/2024   HCT 34.6 (L) 10/19/2024   MCV 88.7 10/19/2024   MCH 26.2 10/19/2024   PLT 305 10/19/2024   MCHC 29.5 (L) 10/19/2024   RDW 15.1 10/19/2024   LYMPHSABS 1.1 10/18/2024   MONOABS 0.6 10/18/2024   EOSABS 0.0 10/18/2024   BASOSABS 0.0 10/18/2024     Last metabolic panel Lab Results  Component Value Date   NA 132 (L) 10/19/2024   K 3.9 10/19/2024   CL 100 10/19/2024   CO2 18 (L) 10/19/2024   BUN 12 10/19/2024   CREATININE 0.77 10/19/2024   GLUCOSE 90 10/19/2024   GFRNONAA >60 10/19/2024   CALCIUM  9.1 10/19/2024   PROT 8.5 (H) 10/18/2024   ALBUMIN 3.9 10/18/2024   BILITOT 0.5 10/18/2024   ALKPHOS 93 10/18/2024   AST 23 10/18/2024   ALT 9 10/18/2024   ANIONGAP 14 10/19/2024    CBG (last 3)  No results for input(s): GLUCAP in the last 72 hours.    Coagulation Profile: Recent Labs  Lab 10/18/24 1557  INR 1.0     Radiology Studies: I have personally reviewed the imaging studies  CT ABDOMEN PELVIS W CONTRAST Result Date: 10/18/2024 CLINICAL DATA:  Right lower quadrant  pain, right upper quadrant pain, fever and chills, questionable sepsis, concern for pulmonary embolus EXAM: CT ANGIOGRAPHY CHEST CT ABDOMEN AND PELVIS WITH CONTRAST TECHNIQUE: Multidetector CT imaging of the chest was performed using the standard protocol during bolus administration  of intravenous contrast. Multiplanar CT image reconstructions and MIPs were obtained to evaluate the vascular anatomy. Multidetector CT imaging of the abdomen and pelvis was performed using the standard protocol during bolus administration of intravenous contrast. RADIATION DOSE REDUCTION: This exam was performed according to the departmental dose-optimization program which includes automated exposure control, adjustment of the mA and/or kV according to patient size and/or use of iterative reconstruction technique. CONTRAST:  OMNIPAQUE  IOHEXOL  350 MG/ML SOLN COMPARISON:  10/18/2024, 03/29/2024 FINDINGS: CTA CHEST FINDINGS Cardiovascular: This is a technically adequate evaluation of the pulmonary vasculature. No filling defects or pulmonary emboli. The heart is unremarkable without pericardial effusion. No evidence of thoracic aortic aneurysm or dissection. Atherosclerosis of the aorta and coronary vasculature. Mediastinum/Nodes: Thyroid, trachea, and esophagus are unremarkable. Bilateral axillary lymphadenopathy, measuring up to 13 mm on the left and 10 mm on the right. No mediastinal or hilar adenopathy. Lungs/Pleura: No acute airspace disease, effusion, or pneumothorax. The central airways are patent. Musculoskeletal: No acute or destructive bony abnormalities. Reconstructed images demonstrate no additional findings. Review of the MIP images confirms the above findings. CT ABDOMEN and PELVIS FINDINGS Hepatobiliary: Multiple calcified gallstones layer dependently within the gallbladder. No evidence of acute cholecystitis. Stable appearance of the liver. No biliary duct dilation or choledocholithiasis. Pancreas: Unremarkable. No  pancreatic ductal dilatation or surrounding inflammatory changes. Spleen: Normal in size without focal abnormality. Adrenals/Urinary Tract: Assessment of the distal ureters and bladder is limited by streak artifact from bilateral hip arthroplasties. The bladder is decompressed. There is fat stranding surrounding the left renal pelvis and proximal left ureter, with proximal left ureteral wall thickening and mucosal hyperenhancement, consistent with urinary tract infection. Slightly heterogeneous enhancement of the left renal parenchyma, most pronounced on delayed imaging, consistent with pyelonephritis. No renal abscess. The right kidney is unremarkable.  The adrenals are normal. Stomach/Bowel: No bowel obstruction or ileus. Normal appendix right lower quadrant. No bowel wall thickening or inflammatory change. Vascular/Lymphatic: Stable atherosclerosis of the abdominal aorta. There is continued lymphadenopathy throughout the abdomen and pelvis, not appreciably changed since prior study. Index right external iliac chain lymph node measures 11 mm image 54/2 and left external iliac chain lymph node 19 mm image 57/2, stable. Reproductive: Uterus and bilateral adnexa are unremarkable. Other: No free fluid or free intraperitoneal gas. Small fat containing umbilical hernia. No bowel herniation. Musculoskeletal: Bilateral hip arthroplasties. No acute or destructive bony abnormalities. Reconstructed images demonstrate no additional findings. Review of the MIP images confirms the above findings. IMPRESSION: 1. Findings consistent with left-sided pyelonephritis and ascending urinary tract infection. No evidence of renal abscess. 2. Lymphadenopathy within the bilateral axillary regions, abdomen, and pelvis, concerning for lymphoproliferative disorder. No significant change in the abdominal and pelvic adenopathy since prior study. 3. Cholelithiasis without evidence of cholecystitis. 4. No evidence of pulmonary embolus. 5.  Aortic  Atherosclerosis (ICD10-I70.0). Electronically Signed   By: Ozell Daring M.D.   On: 10/18/2024 17:44   CT Angio Chest PE W and/or Wo Contrast Result Date: 10/18/2024 CLINICAL DATA:  Right lower quadrant pain, right upper quadrant pain, fever and chills, questionable sepsis, concern for pulmonary embolus EXAM: CT ANGIOGRAPHY CHEST CT ABDOMEN AND PELVIS WITH CONTRAST TECHNIQUE: Multidetector CT imaging of the chest was performed using the standard protocol during bolus administration of intravenous contrast. Multiplanar CT image reconstructions and MIPs were obtained to evaluate the vascular anatomy. Multidetector CT imaging of the abdomen and pelvis was performed using the standard protocol during bolus administration of intravenous contrast. RADIATION DOSE REDUCTION:  This exam was performed according to the departmental dose-optimization program which includes automated exposure control, adjustment of the mA and/or kV according to patient size and/or use of iterative reconstruction technique. CONTRAST:  OMNIPAQUE  IOHEXOL  350 MG/ML SOLN COMPARISON:  10/18/2024, 03/29/2024 FINDINGS: CTA CHEST FINDINGS Cardiovascular: This is a technically adequate evaluation of the pulmonary vasculature. No filling defects or pulmonary emboli. The heart is unremarkable without pericardial effusion. No evidence of thoracic aortic aneurysm or dissection. Atherosclerosis of the aorta and coronary vasculature. Mediastinum/Nodes: Thyroid, trachea, and esophagus are unremarkable. Bilateral axillary lymphadenopathy, measuring up to 13 mm on the left and 10 mm on the right. No mediastinal or hilar adenopathy. Lungs/Pleura: No acute airspace disease, effusion, or pneumothorax. The central airways are patent. Musculoskeletal: No acute or destructive bony abnormalities. Reconstructed images demonstrate no additional findings. Review of the MIP images confirms the above findings. CT ABDOMEN and PELVIS FINDINGS Hepatobiliary: Multiple  calcified gallstones layer dependently within the gallbladder. No evidence of acute cholecystitis. Stable appearance of the liver. No biliary duct dilation or choledocholithiasis. Pancreas: Unremarkable. No pancreatic ductal dilatation or surrounding inflammatory changes. Spleen: Normal in size without focal abnormality. Adrenals/Urinary Tract: Assessment of the distal ureters and bladder is limited by streak artifact from bilateral hip arthroplasties. The bladder is decompressed. There is fat stranding surrounding the left renal pelvis and proximal left ureter, with proximal left ureteral wall thickening and mucosal hyperenhancement, consistent with urinary tract infection. Slightly heterogeneous enhancement of the left renal parenchyma, most pronounced on delayed imaging, consistent with pyelonephritis. No renal abscess. The right kidney is unremarkable.  The adrenals are normal. Stomach/Bowel: No bowel obstruction or ileus. Normal appendix right lower quadrant. No bowel wall thickening or inflammatory change. Vascular/Lymphatic: Stable atherosclerosis of the abdominal aorta. There is continued lymphadenopathy throughout the abdomen and pelvis, not appreciably changed since prior study. Index right external iliac chain lymph node measures 11 mm image 54/2 and left external iliac chain lymph node 19 mm image 57/2, stable. Reproductive: Uterus and bilateral adnexa are unremarkable. Other: No free fluid or free intraperitoneal gas. Small fat containing umbilical hernia. No bowel herniation. Musculoskeletal: Bilateral hip arthroplasties. No acute or destructive bony abnormalities. Reconstructed images demonstrate no additional findings. Review of the MIP images confirms the above findings. IMPRESSION: 1. Findings consistent with left-sided pyelonephritis and ascending urinary tract infection. No evidence of renal abscess. 2. Lymphadenopathy within the bilateral axillary regions, abdomen, and pelvis, concerning for  lymphoproliferative disorder. No significant change in the abdominal and pelvic adenopathy since prior study. 3. Cholelithiasis without evidence of cholecystitis. 4. No evidence of pulmonary embolus. 5.  Aortic Atherosclerosis (ICD10-I70.0). Electronically Signed   By: Ozell Daring M.D.   On: 10/18/2024 17:44   DG Chest Port 1 View Result Date: 10/18/2024 CLINICAL DATA:  Questionable sepsis. EXAM: PORTABLE CHEST 1 VIEW COMPARISON:  Chest radiograph dated 09/07/2022. FINDINGS: No focal consolidation, pleural effusion or pneumothorax. The cardiac silhouette is within normal limits. No acute osseous pathology. IMPRESSION: No active disease. Electronically Signed   By: Vanetta Chou M.D.   On: 10/18/2024 16:43       Yaneliz Radebaugh M.D. [facility] 10/19/2024, 9:33 AM  Available via Epic secure chat 7am-7pm After 7 pm, please refer to night coverage provider listed on amion.

## 2024-10-19 NOTE — Progress Notes (Signed)
   10/19/24 1600  Spiritual Encounters  Type of Visit Initial  Care provided to: Patient  Referral source Patient request  Reason for visit Religious ritual  Interventions  Spiritual Care Interventions Made Established relationship of care and support;Explored values/beliefs/practices/strengths;Prayer   I provided spiritual care support for [facility]  Ms Ashton expressed her Sherle[place] faith as a primary source of strength and meaning. She reflected on significant experiences and faith community that support and evidence this for her. Also, her family has background in ministry.  I provided compassionate presence and active listening. I affirmed Ms Epting's faith and invited her reflections and ways she makes meaning around her health experiences. I offered prayer and gratitude for what she shared and words of encouragement.  Shadell Brenn L. Delores HERO.Div

## 2024-10-19 NOTE — Evaluation (Signed)
 Physical Therapy Evaluation Patient Details Name: Diana Sanchez MRN: [id number] DOB: 11-Jul-1956 Today's Date: 10/19/2024  History of Present Illness  Diana Sanchez is a 68 y.o. female CT revealed pyelonephritis PMH: lupus, Polymyalgia rheumatica, lymphedema, calcinosis cutis, venous stasis with a chronic venous stasis ulcer of her left lower leg  Clinical Impression  Pt admitted as above and presenting with functional mobility limitations 2* generalized weakness/deconditioning, decreased activity tolerance and balance deficits.  Patient will benefit from continued inpatient follow up therapy, <3 hours/day to maximize IND and safety prior to return home with limited assist.         If plan is discharge home, recommend the following: A little help with walking and/or transfers;A little help with bathing/dressing/bathroom;Assistance with cooking/housework;Assist for transportation;Help with stairs or ramp for entrance   Can travel by private vehicle   Yes    Equipment Recommendations None recommended by PT  Recommendations for Other Services       Functional Status Assessment Patient has had a recent decline in their functional status and demonstrates the ability to make significant improvements in function in a reasonable and predictable amount of time.     Precautions / Restrictions Precautions Precautions: None Restrictions Weight Bearing Restrictions Per Provider Order: No      Mobility  Bed Mobility Overal bed mobility: Needs Assistance Bed Mobility: Rolling, Supine to Sit Rolling: Contact guard assist, Used rails   Supine to sit: Min assist, HOB elevated, Used rails     General bed mobility comments: increased time and cues for technique    Transfers Overall transfer level: Needs assistance Equipment used: Rolling walker (2 wheels) Transfers: Sit to/from Stand, Bed to chair/wheelchair/BSC Sit to Stand: Min assist, From elevated surface    Step pivot transfers: Min assist, From elevated surface       General transfer comment: increased time, elevated surface, cues for hand placement    Ambulation/Gait Ambulation/Gait assistance: Min assist Gait Distance (Feet): 22 Feet Assistive device: Rolling walker (2 wheels) Gait Pattern/deviations: Step-to pattern, Decreased step length - right, Decreased step length - left, Shuffle, Trunk flexed Gait velocity: decr     General Gait Details: INcreased time, Steady assist, cues for posture, position from RW; noted gait deviation 2* significant LLD with R LE shorter  Stairs            Wheelchair Mobility     Tilt Bed    Modified Rankin (Stroke Patients Only)       Balance Overall balance assessment: Needs assistance Sitting-balance support: No upper extremity supported, Feet supported Sitting balance-Leahy Scale: Good     Standing balance support: Reliant on assistive device for balance Standing balance-Leahy Scale: Poor Standing balance comment: LLD on R and flexed hips reliant on device                             Pertinent Vitals/Pain Pain Assessment Pain Assessment: Faces Faces Pain Scale: Hurts a little bit Pain Location: abdomen and L LE ulcer/graft site Pain Descriptors / Indicators: Discomfort Pain Intervention(s): Limited activity within patient's tolerance, Monitored during session    Home Living Family/patient expects to be discharged to:: Private residence Living Arrangements: Alone Available Help at Discharge: Family;Available PRN/intermittently;Other (Comment) Type of Home: House Home Access: Level entry     Alternate Level Stairs-Number of Steps: full flight Home Layout: Two level;1/2 bath on main level;Bed/bath upstairs Home Equipment: Cane - single point;Toilet riser;Shower seat;Other (comment);Rollator (  4 wheels) Additional Comments: patient not 100% sure if the walker she has is a rollator or standard since it is still  in the box, has been sponge bathing for a few years    Prior Function Prior Level of Function : Independent/Modified Independent             Mobility Comments: amb with a SPC, has tight spaces in home for walker use, was weak and not able to manage stairs a few days prior to hospital, drives and goes to [facility] wit limited community time spent to necessity ADLs Comments: had a pca but had discontinued, sponge bathes, needs a reacher     Extremity/Trunk Assessment   Upper Extremity Assessment Upper Extremity Assessment: Generalized weakness    Lower Extremity Assessment Lower Extremity Assessment: Generalized weakness (Noted dressing in place lateral L ankle - skin graft ~ 2 weeks ago at [facility])    Cervical / Trunk Assessment Cervical / Trunk Assessment: Kyphotic;Other exceptions Cervical / Trunk Exceptions: Has L LE leg length descrepancy 2* prior fx  Communication   Communication Communication: No apparent difficulties    Cognition Arousal: Alert Behavior During Therapy: WFL for tasks assessed/performed   PT - Cognitive impairments: No apparent impairments                         Following commands: Intact       Cueing Cueing Techniques: Verbal cues     General Comments General comments (skin integrity, edema, etc.): L LE intact anterior shin bandage covering recent skin graft; reported some lightheadedness post assisted amb with BP 133/72, HR 77 which resolved with rest, limited activity tolerance with chair follow needed    Exercises     Assessment/Plan    PT Assessment Patient needs continued PT services  PT Problem List Decreased strength;Decreased range of motion;Decreased activity tolerance;Decreased balance;Decreased mobility;Decreased knowledge of use of DME       PT Treatment Interventions DME instruction;Gait training;Stair training;Functional mobility training;Therapeutic activities;Therapeutic exercise;Patient/family education;Balance training     PT Goals (Current goals can be found in the Care Plan section)  Acute Rehab PT Goals Patient Stated Goal: Regain strength and IND PT Goal Formulation: With patient Time For Goal Achievement: 10/26/24 Potential to Achieve Goals: Good    Frequency Min 2X/week     Co-evaluation PT/OT/SLP Co-Evaluation/Treatment: Yes Reason for Co-Treatment: For patient/therapist safety;To address functional/ADL transfers PT goals addressed during session: Balance;Mobility/safety with mobility OT goals addressed during session: ADL's and self-care;Proper use of Adaptive equipment and DME       AM-PAC PT 6 Clicks Mobility  Outcome Measure Help needed turning from your back to your side while in a flat bed without using bedrails?: A Little Help needed moving from lying on your back to sitting on the side of a flat bed without using bedrails?: A Little Help needed moving to and from a bed to a chair (including a wheelchair)?: A Little Help needed standing up from a chair using your arms (e.g., wheelchair or bedside chair)?: A Little Help needed to walk in hospital room?: A Lot Help needed climbing 3-5 steps with a railing? : Total 6 Click Score: 15    End of Session Equipment Utilized During Treatment: Gait belt Activity Tolerance: Patient tolerated treatment well;Patient limited by fatigue Patient left: in chair;with call bell/phone within reach;with chair alarm set Nurse Communication: Mobility status PT Visit Diagnosis: Difficulty in walking, not elsewhere classified (R26.2);Muscle weakness (generalized) (M62.81)  Time: 8944-8862 PT Time Calculation (min) (ACUTE ONLY): 42 min   Charges:   PT Evaluation $PT Eval Low Complexity: 1 Low   PT General Charges $$ ACUTE PT VISIT: 1 Visit         [facility] PT Acute Rehabilitation Services Office [phone]   Byan Poplaski 10/19/2024, 1:23 PM

## 2024-10-19 NOTE — Plan of Care (Signed)
  Problem: Education: Goal: Knowledge of General Education information will improve Description: Including pain rating scale, medication(s)/side effects and non-pharmacologic comfort measures Outcome: Progressing   Problem: Health Behavior/Discharge Planning: Goal: Ability to manage health-related needs will improve Outcome: Progressing   Problem: Clinical Measurements: Goal: Ability to maintain clinical measurements within normal limits will improve Outcome: Progressing   Problem: Activity: Goal: Risk for activity intolerance will decrease Outcome: Progressing   Problem: Nutrition: Goal: Adequate nutrition will be maintained Outcome: Progressing   Problem: Coping: Goal: Level of anxiety will decrease Outcome: Progressing   Problem: Elimination: Goal: Will not experience complications related to bowel motility Outcome: Progressing   Problem: Pain Managment: Goal: General experience of comfort will improve and/or be controlled Outcome: Progressing   Problem: Safety: Goal: Ability to remain free from injury will improve Outcome: Progressing

## 2024-10-19 NOTE — Consult Note (Addendum)
 [facility]  [phone]) [phone] [phone]    HEMATOLOGY CONSULTATION  PURPOSE OF CONSULTATION/CHIEF COMPLAINT:  Lymphadenopathy in the bilateral axillary regions, abdomen and pelvis concerning for lymphoproliferative disorder.   Referring MD:  Dr. Davia   HPI: Diana Sanchez is a 68 year old female patient admitted on 10/18/2024 with complaints of fever and generalized bodyaches.  Workup including imaging concerning for lymphadenopathy in bilateral axillary, abdomen and pelvis.  Therefore oncology evaluation has been requested. Patient is seen and assessed today.  She is a very pleasant lady who details a very interesting history.  She confirms coming to the hospital because since last week Thursday (8 days ago) she felt sick, feverish, chills, progressively weak, and was unable to stand.  Admits to no appetite in the last 8 days and states that she lost 4 pounds. Medical history significant for arthritis for which she had surgery of her knees over 10 years ago with subsequent complications including DVT.  Developed bilateral leg wounds after surgery, right LE has healed however left LE has chronic wound for which she sees the wound team at [facility].  Wound is presently covered during this exam.  Patient also states that 1 PCP told her that she had diabetes although another 1 told her that she did not have diabetes.  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 Surgical history as stated includes knee replacement.  This was done at [facility] in [place] over 10 years ago.  She also had right hip replacement in [place]  several years ago. More recently she had 2 skin grafts of LLE at [facility] this October 2025. Social history is noncontributory: Denies tobacco use, denies alcohol use, denies recreational or illicit drug use.  Patient is originally from [place].  She lives alone in [place] and has multiple family  members in [place], wishes to be able to move back to [place] to be nearer to family.  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   ASSESSMENT AND PLAN:  Lymphadenopathy with concern for lymphoproliferative disorder - CT imaging done 10/18/2024 shows lymphadenopathy within bilateral axillary regions, abdomen, pelvis, concerning for lymphoproliferative disorder.  Of note she has had previous imaging done 03/29/2024 which showed same.  Findings were thought to be nonspecific and reactive at that time. - There was also another CT done 01/06/2023 that showed several enlarged pelvic and lower retroperitoneal nodes.  Also thought to be reactive at that time. - Patient admits to never seeing hematology/oncology previously. - Medical oncology/Dr. Lonn following and will make further evaluation.  Pyelonephritis Sepsis - Continue IV antibiotics as ordered - Blood cultures NGTD.  Respiratory panel negative.  Urine cultures pending. - Monitor fever curve  History of DVT LLE wound - Patient reports that she follows with wound care at [facility].  Status post 2 skin grafts surgeries October 2025. - History of MRSA - Continue wound care of left lower extremity as ordered.  Close follow-up with [facility] upon discharge.  Arthritis History of lupus - Patient reports that she had arthritis in her knees necessitating knee replacement surgery 10 years ago.  States she has had multiple complications since surgery. - Continue pain management as ordered. - Continue outpatient follow-up with rheumatology and PCP  Anemia, normocytic - Mild - Hemoglobin 10.2 today.  Baseline appears to be in the 11-12 range - No intervention required at this  time - Continue to monitor CBC with differential  Hypertension - Monitor blood pressure closely - Not on antihypertensives     Past Medical History:  Diagnosis Date   Arthritis    DVT of lower limb, acute (HCC)     FH: total knee replacement    H/O fracture of hip 03/21/2020   Hypercholesteremia    Hypertension    Infection    in knee   Migraines   :  Past Surgical History:  Procedure Laterality Date   BREAST BIOPSY Right 12/20/2019   axilla u/s bx   CESAREAN SECTION     HIP SURGERY     TOTAL KNEE ARTHROPLASTY    :  Allergies  Allergen Reactions   Tramadol Other (See Comments)    nausea  Other reaction(s): Unknown  Other Reaction(s): Dizziness, Unknown  Other reaction(s): Nausea And Vomiting, nausea, makes a really bad headache too  :   Family History  Problem Relation Age of Onset   Breast cancer Neg Hx    BRCA 1/2 Neg Hx   :   Social History   Socioeconomic History   Marital status: Single    Spouse name: Not on file   Number of children: Not on file   Years of education: Not on file   Highest education level: Not on file  Occupational History   Not on file  Tobacco Use   Smoking status: Never   Smokeless tobacco: Never  Substance and Sexual Activity   Alcohol use: No   Drug use: Not on file   Sexual activity: Not on file  Other Topics Concern   Not on file  Social History Narrative   Not on file   Social Drivers of Health   Financial Resource Strain: Low Risk  (03/25/2023)   Received from [facility]   Overall Financial Resource Strain (CARDIA)    Difficulty of Paying Living Expenses: Not hard at all  Food Insecurity: Food Insecurity Present (10/18/2024)   Hunger Vital Sign    Worried About Running Out of Food in the Last Year: Sometimes true    Ran Out of Food in the Last Year: Sometimes true  Transportation Needs: Unmet Transportation Needs (10/18/2024)   PRAPARE - Administrator, Civil Service (Medical): Yes    Lack of Transportation (Non-Medical): No  Physical Activity: Inactive (04/27/2021)   Received from [facility]   Exercise Vital Sign    On average, how many days per week do you engage in moderate to strenuous exercise  (like a brisk walk)?: 0 days    On average, how many minutes do you engage in exercise at this level?: 0 min  Stress: No Stress Concern Present (04/27/2021)   Received from [facility] - Occupational Stress Questionnaire    Feeling of Stress : Only a little  Social Connections: Moderately Integrated (10/18/2024)   Social Connection and Isolation Panel    Frequency of Communication with Friends and Family: More than three times a week    Frequency of Social Gatherings with Friends and Family: Never    Attends Religious Services: 1 to 4 times per year    Active Member of [employer] or Organizations: Yes    Attends Banker Meetings: 1 to 4 times per year    Marital Status: Divorced  Intimate Partner Violence: Not At Risk (10/18/2024)   Humiliation, Afraid, Rape, and Kick questionnaire    Fear of Current or Ex-Partner:  No    Emotionally Abused: No    Physically Abused: No    Sexually Abused: No  :   CURRENT MEDS: Current Facility-Administered Medications  Medication Dose Route Frequency Provider Last Rate Last Admin   acetaminophen  (TYLENOL ) tablet 650 mg  650 mg Oral Q6H PRN Arthea Child, MD   650 mg at 10/19/24 9056   Or   acetaminophen  (TYLENOL ) suppository 650 mg  650 mg Rectal Q6H PRN Arthea Child, MD       albuterol  (PROVENTIL ) (2.5 MG/3ML) 0.083% nebulizer solution 2.5 mg  2.5 mg Nebulization Q6H PRN Arthea Child, MD       ascorbic acid  (VITAMIN C ) tablet 500 mg  500 mg Oral Daily Arthea Child, MD   500 mg at 10/19/24 9057   bisacodyl (DULCOLAX) EC tablet 5 mg  5 mg Oral Daily PRN Arthea Child, MD       cefTRIAXone  (ROCEPHIN ) 2 g in sodium chloride  0.9 % 100 mL IVPB  2 g Intravenous Q24H Arthea Child, MD 200 mL/hr at 10/19/24 0548 2 g at 10/19/24 0548   feeding supplement (ENSURE PLUS HIGH PROTEIN) liquid 237 mL  237 mL Oral BID BM Arthea Child, MD   237 mL at 10/19/24 0943   melatonin  tablet 5 mg  5 mg Oral QHS Arthea Child, MD   5 mg at 10/18/24 2348   ondansetron  (ZOFRAN ) tablet 4 mg  4 mg Oral Q6H PRN Arthea Child, MD       Or   ondansetron  (ZOFRAN ) injection 4 mg  4 mg Intravenous Q6H PRN Arthea Child, MD       triamcinolone ointment (KENALOG) 0.1 %   Topical Once per day on Monday Thursday Rai, Ripudeep K, MD        PHYSICAL EXAMINATION: ECOG PERFORMANCE STATUS: 3 - Symptomatic, >50% confined to bed  Vitals:   10/19/24 0544 10/19/24 0856  BP: 123/70 129/61  Pulse: 78 82  Resp: 20 16  Temp: 98.3 F (36.8 C) 97.8 F (36.6 C)  SpO2: 98% 100%   Filed Weights   10/18/24 1531  Weight: 190 lb 0.6 oz (86.2 kg)    GENERAL: alert, no distress and comfortable SKIN: +RLE wound w/dressing intact EYES: normal, conjunctiva are pink and non-injected, sclera clear OROPHARYNX: no exudate, no erythema and lips, buccal mucosa, and tongue normal  NECK: supple, thyroid normal size, non-tender, without nodularity LYMPH: no palpable lymphadenopathy in the cervical, axillary or inguinal LUNGS: clear to auscultation and percussion with normal breathing effort HEART: regular rate & rhythm and no murmurs and no lower extremity edema ABDOMEN: abdomen soft, non-tender and normal bowel sounds MUSCULOSKELETAL: +difficulty ambulating PSYCH: alert & oriented x 3 with fluent speech NEURO: no focal motor/sensory deficits   LABS: Lab Results  Component Value Date   WBC 9.3 10/19/2024   HGB 10.2 (L) 10/19/2024   HCT 34.6 (L) 10/19/2024   MCV 88.7 10/19/2024   PLT 305 10/19/2024    Lab Results  Component Value Date   WBC 9.3 10/19/2024   HGB 10.2 (L) 10/19/2024   HCT 34.6 (L) 10/19/2024   PLT 305 10/19/2024   GLUCOSE 90 10/19/2024   ALT 9 10/18/2024   AST 23 10/18/2024   NA 132 (L) 10/19/2024   K 3.9 10/19/2024   CL 100 10/19/2024   CREATININE 0.77 10/19/2024   BUN 12 10/19/2024   CO2 18 (L) 10/19/2024   INR 1.0 10/18/2024   HGBA1C 5.7 (H)  10/19/2024    CT ABDOMEN PELVIS  W CONTRAST Result Date: 10/18/2024 CLINICAL DATA:  Right lower quadrant pain, right upper quadrant pain, fever and chills, questionable sepsis, concern for pulmonary embolus EXAM: CT ANGIOGRAPHY CHEST CT ABDOMEN AND PELVIS WITH CONTRAST TECHNIQUE: Multidetector CT imaging of the chest was performed using the standard protocol during bolus administration of intravenous contrast. Multiplanar CT image reconstructions and MIPs were obtained to evaluate the vascular anatomy. Multidetector CT imaging of the abdomen and pelvis was performed using the standard protocol during bolus administration of intravenous contrast. RADIATION DOSE REDUCTION: This exam was performed according to the departmental dose-optimization program which includes automated exposure control, adjustment of the mA and/or kV according to patient size and/or use of iterative reconstruction technique. CONTRAST:  OMNIPAQUE  IOHEXOL  350 MG/ML SOLN COMPARISON:  10/18/2024, 03/29/2024 FINDINGS: CTA CHEST FINDINGS Cardiovascular: This is a technically adequate evaluation of the pulmonary vasculature. No filling defects or pulmonary emboli. The heart is unremarkable without pericardial effusion. No evidence of thoracic aortic aneurysm or dissection. Atherosclerosis of the aorta and coronary vasculature. Mediastinum/Nodes: Thyroid, trachea, and esophagus are unremarkable. Bilateral axillary lymphadenopathy, measuring up to 13 mm on the left and 10 mm on the right. No mediastinal or hilar adenopathy. Lungs/Pleura: No acute airspace disease, effusion, or pneumothorax. The central airways are patent. Musculoskeletal: No acute or destructive bony abnormalities. Reconstructed images demonstrate no additional findings. Review of the MIP images confirms the above findings. CT ABDOMEN and PELVIS FINDINGS Hepatobiliary: Multiple calcified gallstones layer dependently within the gallbladder. No evidence of acute cholecystitis.  Stable appearance of the liver. No biliary duct dilation or choledocholithiasis. Pancreas: Unremarkable. No pancreatic ductal dilatation or surrounding inflammatory changes. Spleen: Normal in size without focal abnormality. Adrenals/Urinary Tract: Assessment of the distal ureters and bladder is limited by streak artifact from bilateral hip arthroplasties. The bladder is decompressed. There is fat stranding surrounding the left renal pelvis and proximal left ureter, with proximal left ureteral wall thickening and mucosal hyperenhancement, consistent with urinary tract infection. Slightly heterogeneous enhancement of the left renal parenchyma, most pronounced on delayed imaging, consistent with pyelonephritis. No renal abscess. The right kidney is unremarkable.  The adrenals are normal. Stomach/Bowel: No bowel obstruction or ileus. Normal appendix right lower quadrant. No bowel wall thickening or inflammatory change. Vascular/Lymphatic: Stable atherosclerosis of the abdominal aorta. There is continued lymphadenopathy throughout the abdomen and pelvis, not appreciably changed since prior study. Index right external iliac chain lymph node measures 11 mm image 54/2 and left external iliac chain lymph node 19 mm image 57/2, stable. Reproductive: Uterus and bilateral adnexa are unremarkable. Other: No free fluid or free intraperitoneal gas. Small fat containing umbilical hernia. No bowel herniation. Musculoskeletal: Bilateral hip arthroplasties. No acute or destructive bony abnormalities. Reconstructed images demonstrate no additional findings. Review of the MIP images confirms the above findings. IMPRESSION: 1. Findings consistent with left-sided pyelonephritis and ascending urinary tract infection. No evidence of renal abscess. 2. Lymphadenopathy within the bilateral axillary regions, abdomen, and pelvis, concerning for lymphoproliferative disorder. No significant change in the abdominal and pelvic adenopathy since prior  study. 3. Cholelithiasis without evidence of cholecystitis. 4. No evidence of pulmonary embolus. 5.  Aortic Atherosclerosis (ICD10-I70.0). Electronically Signed   By: Ozell Daring M.D.   On: 10/18/2024 17:44   CT Angio Chest PE W and/or Wo Contrast Result Date: 10/18/2024 CLINICAL DATA:  Right lower quadrant pain, right upper quadrant pain, fever and chills, questionable sepsis, concern for pulmonary embolus EXAM: CT ANGIOGRAPHY CHEST CT ABDOMEN AND PELVIS WITH CONTRAST TECHNIQUE: Multidetector CT imaging  of the chest was performed using the standard protocol during bolus administration of intravenous contrast. Multiplanar CT image reconstructions and MIPs were obtained to evaluate the vascular anatomy. Multidetector CT imaging of the abdomen and pelvis was performed using the standard protocol during bolus administration of intravenous contrast. RADIATION DOSE REDUCTION: This exam was performed according to the departmental dose-optimization program which includes automated exposure control, adjustment of the mA and/or kV according to patient size and/or use of iterative reconstruction technique. CONTRAST:  OMNIPAQUE  IOHEXOL  350 MG/ML SOLN COMPARISON:  10/18/2024, 03/29/2024 FINDINGS: CTA CHEST FINDINGS Cardiovascular: This is a technically adequate evaluation of the pulmonary vasculature. No filling defects or pulmonary emboli. The heart is unremarkable without pericardial effusion. No evidence of thoracic aortic aneurysm or dissection. Atherosclerosis of the aorta and coronary vasculature. Mediastinum/Nodes: Thyroid, trachea, and esophagus are unremarkable. Bilateral axillary lymphadenopathy, measuring up to 13 mm on the left and 10 mm on the right. No mediastinal or hilar adenopathy. Lungs/Pleura: No acute airspace disease, effusion, or pneumothorax. The central airways are patent. Musculoskeletal: No acute or destructive bony abnormalities. Reconstructed images demonstrate no additional findings.  Review of the MIP images confirms the above findings. CT ABDOMEN and PELVIS FINDINGS Hepatobiliary: Multiple calcified gallstones layer dependently within the gallbladder. No evidence of acute cholecystitis. Stable appearance of the liver. No biliary duct dilation or choledocholithiasis. Pancreas: Unremarkable. No pancreatic ductal dilatation or surrounding inflammatory changes. Spleen: Normal in size without focal abnormality. Adrenals/Urinary Tract: Assessment of the distal ureters and bladder is limited by streak artifact from bilateral hip arthroplasties. The bladder is decompressed. There is fat stranding surrounding the left renal pelvis and proximal left ureter, with proximal left ureteral wall thickening and mucosal hyperenhancement, consistent with urinary tract infection. Slightly heterogeneous enhancement of the left renal parenchyma, most pronounced on delayed imaging, consistent with pyelonephritis. No renal abscess. The right kidney is unremarkable.  The adrenals are normal. Stomach/Bowel: No bowel obstruction or ileus. Normal appendix right lower quadrant. No bowel wall thickening or inflammatory change. Vascular/Lymphatic: Stable atherosclerosis of the abdominal aorta. There is continued lymphadenopathy throughout the abdomen and pelvis, not appreciably changed since prior study. Index right external iliac chain lymph node measures 11 mm image 54/2 and left external iliac chain lymph node 19 mm image 57/2, stable. Reproductive: Uterus and bilateral adnexa are unremarkable. Other: No free fluid or free intraperitoneal gas. Small fat containing umbilical hernia. No bowel herniation. Musculoskeletal: Bilateral hip arthroplasties. No acute or destructive bony abnormalities. Reconstructed images demonstrate no additional findings. Review of the MIP images confirms the above findings. IMPRESSION: 1. Findings consistent with left-sided pyelonephritis and ascending urinary tract infection. No evidence of  renal abscess. 2. Lymphadenopathy within the bilateral axillary regions, abdomen, and pelvis, concerning for lymphoproliferative disorder. No significant change in the abdominal and pelvic adenopathy since prior study. 3. Cholelithiasis without evidence of cholecystitis. 4. No evidence of pulmonary embolus. 5.  Aortic Atherosclerosis (ICD10-I70.0). Electronically Signed   By: Ozell Daring M.D.   On: 10/18/2024 17:44   DG Chest Port 1 View Result Date: 10/18/2024 CLINICAL DATA:  Questionable sepsis. EXAM: PORTABLE CHEST 1 VIEW COMPARISON:  Chest radiograph dated 09/07/2022. FINDINGS: No focal consolidation, pleural effusion or pneumothorax. The cardiac silhouette is within normal limits. No acute osseous pathology. IMPRESSION: No active disease. Electronically Signed   By: Vanetta Chou M.D.   On: 10/18/2024 16:43     The total time spent in the appointment was 55 minutes encounter with patients including review of chart and various  tests results, discussions about plan of care and coordination of care plan   All questions were answered. The patient knows to call the clinic with any problems, questions or concerns. No barriers to learning was detected.  Thank you for the courtesy of this consultation, Olam JINNY Brunner, NP  11/7/202510:43 AM  I have seen the patient and examined her myself.  I reviewed her chart extensively.  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  Her examination is benign The patient has not been receiving any treatment from a rheumatologist for joint pain   Assessment and plan #1  Reactive lymphadenopathy Secondary to untreated lupus There is no  indication for biopsy I plan to repeat imaging study in the outpatient setting in 3 months  #2 history of lupus/joint pain Currently not on active treatment I will defer to her primary care doctor for follow-up  #3  Anemia chronic illness Likely due to untreated lupus and active infection Monitor closely, she is not symptomatic  #4  History of DVT and leg wound Continue wound care  Discharge planning Will defer to primary service After discharge from the hospital, I will arrange 1 month follow-up in the clinic  Diana Bedford, MD 10/19/2024

## 2024-10-19 NOTE — Evaluation (Signed)
 Occupational Therapy Evaluation Patient Details Name: Diana Sanchez MRN: [id number] DOB: Nov 20, 1956 Today's Date: 10/19/2024   History of Present Illness   Diana Sanchez is a 68 y.o. 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 Patient was relatively mod I including occasional driving and amb with SPC but limits community access and had had PCA services due to full flight of stairs, bathing and IADL challenges but had cancelled services recently. Currently, patient presents with deficits outlined below (see OT Problem List for details) most significantly pain, decreased activity tolerance, balance, skin integrity, postural and joint ROM and overall muscle strength deficits limiting BADL's (mod-max A LB) and functional mobility (min A short assisted amb) performance. Patient will benefit from continued inpatient follow up therapy, <3 hours/day. Patient requires continued Acute care hospital level OT services to progress safety and functional performance and allow for discharge.       If plan is discharge home, recommend the following:   A lot of help with walking and/or transfers;A lot of help with bathing/dressing/bathroom;Assistance with cooking/housework;Help with stairs or ramp for entrance;Assist for transportation     Functional Status Assessment   Patient has had a recent decline in their functional status and demonstrates the ability to make significant improvements in function in a reasonable and predictable amount of time.     Equipment Recommendations   None recommended by OT      Precautions/Restrictions   Precautions Precautions: None Restrictions Weight Bearing Restrictions Per Provider Order: No     Mobility Bed  Mobility Overal bed mobility: Needs Assistance Bed Mobility: Rolling, Supine to Sit Rolling: Contact guard assist, Used rails   Supine to sit: Min assist, HOB elevated, Used rails     General bed mobility comments: increased time and cues for technique    Transfers Overall transfer level: Needs assistance Equipment used: Rolling walker (2 wheels) Transfers: Sit to/from Stand, Bed to chair/wheelchair/BSC Sit to Stand: Min assist, From elevated surface     Step pivot transfers: Min assist, From elevated surface     General transfer comment: increased time, elevated surface, cues for hand placement, slow gait reliant on UE support      Balance Overall balance assessment: Needs assistance Sitting-balance support: No upper extremity supported, Feet supported Sitting balance-Leahy Scale: Good     Standing balance support: Reliant on assistive device for balance Standing balance-Leahy Scale: Poor Standing balance comment: LLD on R and flexed hips reliant on device                           ADL either performed or assessed with clinical judgement   ADL Overall ADL's : Needs assistance/impaired Eating/Feeding: Set up;Sitting   Grooming: Wash/dry hands;Wash/dry face;Set up;Sitting   Upper Body Bathing: Contact guard assist;Sitting   Lower Body Bathing: Moderate assistance;Sit to/from stand   Upper Body Dressing : Minimal assistance;Sitting   Lower Body Dressing: Moderate assistance;Maximal assistance;Sit to/from stand   Toilet Transfer: Minimal assistance;Rolling walker (2 wheels)   Toileting- Clothing Manipulation and Hygiene: Moderate assistance;Sit to/from stand Toileting - Clothing Manipulation Details (indicate cue type and reason): encouraged to move away from [facility] use to toilet/BSC     Functional mobility during ADLs: Minimal assistance;Rolling walker (2 wheels) General ADL Comments: limited  activity tolerance and functional reach     Vision  Baseline Vision/History: 0 No visual deficits;1 Wears glasses              Pertinent Vitals/Pain Pain Assessment Pain Assessment: Faces Faces Pain Scale: Hurts a little bit Pain Location: abdomen and L LE ulcer/graft site Pain Descriptors / Indicators: Discomfort Pain Intervention(s): Limited activity within patient's tolerance, Monitored during session, Premedicated before session, Repositioned, Other (comment) (elevation)     Extremity/Trunk Assessment Upper Extremity Assessment Upper Extremity Assessment: Generalized weakness;Right hand dominant (hx of RTC issues with B AROM ~0-125)   Lower Extremity Assessment Lower Extremity Assessment: Defer to PT evaluation   Cervical / Trunk Assessment Cervical / Trunk Assessment: Kyphotic;Other exceptions (has R LE LLD with postural impact in standing)   Communication Communication Communication: No apparent difficulties   Cognition Arousal: Alert Behavior During Therapy: WFL for tasks assessed/performed Cognition: No apparent impairments                               Following commands: Intact       Cueing  General Comments   Cueing Techniques: Verbal cues  L LE intact anterior shin bandage covering recent skin graft; reported some lightheadedness post assisted amb with BP 133/72, HR 77 which resolved with rest, limited activity tolerance with chair follow needed           Home Living Family/patient expects to be discharged to:: Private residence Living Arrangements: Alone Available Help at Discharge: Family;Available PRN/intermittently;Other (Comment) (family lives in [place]) Type of Home: House Home Access: Level entry     Home Layout: Two level;1/2 bath on main level;Bed/bath upstairs Alternate Level Stairs-Number of Steps: full flight Alternate Level Stairs-Rails: Right;Left;Can reach both Bathroom Shower/Tub: Chief Strategy Officer: Handicapped height     Home Equipment: Cane -  single point;Toilet riser;Shower seat;Other (comment);Rollator (4 wheels) (lift shoe)   Additional Comments: patient not 100% sure if the walker she has is a rollator or standard since it is still in the box, has been sponge bathing for a few years      Prior Functioning/Environment Prior Level of Function : Independent/Modified Independent             Mobility Comments: amb with a SPC, has tight spaces in home for walker use, was weak and not able to manage stairs a few days prior to hospital, drives and goes to [place] wit limited community time spent to necessity ADLs Comments: had a pca but had discontinued, sponge bathes, needs a reacher    OT Problem List: Decreased strength;Decreased activity tolerance;Decreased range of motion;Impaired balance (sitting and/or standing);Cardiopulmonary status limiting activity;Pain   OT Treatment/Interventions: Self-care/ADL training;Therapeutic exercise;Neuromuscular education;Energy conservation;DME and/or AE instruction;Therapeutic activities;Patient/family education;Balance training      OT Goals(Current goals can be found in the care plan section)   Acute Rehab OT Goals Patient Stated Goal: to get stronger OT Goal Formulation: With patient Time For Goal Achievement: 11/02/24 Potential to Achieve Goals: Good ADL Goals Pt Will Perform Lower Body Bathing: with supervision;with adaptive equipment;sit to/from stand Pt Will Perform Lower Body Dressing: with supervision;with adaptive equipment;sit to/from stand Pt Will Transfer to Toilet: with supervision;ambulating;bedside commode;grab bars Pt Will Perform Toileting - Clothing Manipulation and hygiene: with supervision;sit to/from stand Pt Will Perform Tub/Shower Transfer: with contact guard assist;Shower transfer;ambulating;shower seat Pt/caregiver will Perform Home Exercise Program: Increased strength;Independently;With written HEP provided   OT Frequency:  Min 2X/week    Co-evaluation    Reason for Co-Treatment: For patient/therapist safety;To address functional/ADL transfers PT goals addressed during session: Balance;Mobility/safety with mobility OT goals addressed during session: ADL's and self-care;Proper use of Adaptive equipment and DME      AM-PAC OT 6 Clicks Daily Activity     Outcome Measure Help from another person eating meals?: A Little Help from another person taking care of personal grooming?: A Little Help from another person toileting, which includes using toliet, bedpan, or urinal?: A Lot Help from another person bathing (including washing, rinsing, drying)?: A Lot Help from another person to put on and taking off regular upper body clothing?: A Little Help from another person to put on and taking off regular lower body clothing?: A Lot 6 Click Score: 15   End of Session Equipment Utilized During Treatment: Gait belt;Rolling walker (2 wheels) Nurse Communication: Mobility status;Other (comment) (use of commode)  Activity Tolerance: Patient limited by fatigue Patient left: in chair;with call bell/phone within reach;with chair alarm set  OT Visit Diagnosis: Unsteadiness on feet (R26.81);Muscle weakness (generalized) (M62.81);Pain Pain - Right/Left: Left Pain - part of body: Leg (abdomen)                Time: 1050-1120 OT Time Calculation (min): 30 min Charges:  OT General Charges $OT Visit: 1 Visit OT Evaluation $OT Eval Low Complexity: 1 Low  Kaedyn Polivka OT/L Acute Rehabilitation Department  [phone]  10/19/2024, 11:59 AM

## 2024-10-20 DIAGNOSIS — N12 Tubulo-interstitial nephritis, not specified as acute or chronic: Secondary | ICD-10-CM | POA: Diagnosis not present

## 2024-10-20 DIAGNOSIS — N10 Acute tubulo-interstitial nephritis: Secondary | ICD-10-CM | POA: Diagnosis not present

## 2024-10-20 DIAGNOSIS — M353 Polymyalgia rheumatica: Secondary | ICD-10-CM | POA: Diagnosis not present

## 2024-10-20 DIAGNOSIS — B9620 Unspecified Escherichia coli [E. coli] as the cause of diseases classified elsewhere: Secondary | ICD-10-CM | POA: Diagnosis present

## 2024-10-20 DIAGNOSIS — L97909 Non-pressure chronic ulcer of unspecified part of unspecified lower leg with unspecified severity: Secondary | ICD-10-CM

## 2024-10-20 DIAGNOSIS — R7881 Bacteremia: Secondary | ICD-10-CM | POA: Diagnosis present

## 2024-10-20 DIAGNOSIS — R52 Pain, unspecified: Secondary | ICD-10-CM | POA: Diagnosis not present

## 2024-10-20 LAB — RENAL FUNCTION PANEL
Albumin: 3.3 g/dL — ABNORMAL LOW (ref 3.5–5.0)
Anion gap: 13 (ref 5–15)
BUN: 17 mg/dL (ref 8–23)
CO2: 19 mmol/L — ABNORMAL LOW (ref 22–32)
Calcium: 8.9 mg/dL (ref 8.9–10.3)
Chloride: 100 mmol/L (ref 98–111)
Creatinine, Ser: 0.63 mg/dL (ref 0.44–1.00)
GFR, Estimated: >60 mL/min (ref >60)
Glucose, Bld: 92 mg/dL (ref 70–99)
Phosphorus: 3 mg/dL (ref 2.5–4.6)
Potassium: 3.9 mmol/L (ref 3.5–5.1)
Sodium: 132 mmol/L — ABNORMAL LOW (ref 135–145)

## 2024-10-20 LAB — CBC
HCT: 33.6 % — ABNORMAL LOW (ref 36.0–46.0)
Hemoglobin: 10.4 g/dL — ABNORMAL LOW (ref 12.0–15.0)
MCH: 26.7 pg (ref 26.0–34.0)
MCHC: 31 g/dL (ref 30.0–36.0)
MCV: 86.2 fL (ref 80.0–100.0)
Platelets: 250 K/uL (ref 150–400)
RBC: 3.9 MIL/uL (ref 3.87–5.11)
RDW: 14.6 % (ref 11.5–15.5)
WBC: 6.3 K/uL (ref 4.0–10.5)
nRBC: 0 % (ref 0.0–0.2)

## 2024-10-20 LAB — URINE CULTURE: Culture: >=100000 — AB

## 2024-10-20 MED ORDER — KETOROLAC TROMETHAMINE 15 MG/ML IJ SOLN
15.0000 mg | Freq: Three times a day (TID) | INTRAMUSCULAR | Status: AC
  Administered 2024-10-20 – 2024-10-21 (×4): 15 mg via INTRAVENOUS
  Filled 2024-10-20 (×5): qty 1

## 2024-10-20 MED ORDER — ACETAMINOPHEN 500 MG PO TABS
1000.0000 mg | ORAL_TABLET | Freq: Three times a day (TID) | ORAL | Status: AC
  Administered 2024-10-20 – 2024-10-23 (×9): 1000 mg via ORAL
  Filled 2024-10-20 (×9): qty 2

## 2024-10-20 MED ORDER — PANTOPRAZOLE SODIUM 40 MG PO TBEC
40.0000 mg | DELAYED_RELEASE_TABLET | Freq: Every day | ORAL | Status: DC
  Administered 2024-10-20 – 2024-10-23 (×4): 40 mg via ORAL
  Filled 2024-10-20 (×4): qty 1

## 2024-10-20 NOTE — Progress Notes (Signed)
 [facility]                                                                              [place], is a 68 y.o. female, DOB - 11/01/1956, FMW:[id number] Admit date - 10/18/2024    Outpatient Primary MD for the patient is Delores Rojelio Caldron, NP  LOS - 1  days  Chief Complaint  Patient presents with   Fever   Generalized Body Aches       Brief summary   Patient is a 68 year old female with history of lupus, PMR, lymphedema, venous stasis with chronic venous stasis ulcer on the left lower leg for many years, follows with plastic surgery at [facility], had skin graft placed 2 weeks ago on 10/14.  Per patient, this was outpatient procedure and did not require Foley catheter.  A week ago she felt fever and chills with myalgias, dysuria, increased urinary frequency.  She has been incontinent of urine for the last week which is not her baseline.  No nausea vomiting but poor appetite.  Has been drinking only liquids.  CT chest abdomen pelvis in ED showed left-sided pyelonephritis and ascending UTI, no renal abscess.  Lymphadenopathy with bilateral axillary regions, abdominal pelvis concerning for lymphoproliferative disorder, no significant change in the abdominal and pelvic adenopathy since prior study.  Cholelithiasis without evidence of cholecystitis.  Assessment & Plan      Acute left-sided pyelonephritis, E. coli UTI E. coli bacteremia -Urine culture shows more than 100,000 colonies of E. coli  - Blood cultures positive for E. Coli, repeat blood cultures - Continue Rocephin  2 g IV daily, follow sensitivities  Acute on chronic pain - Placed on scheduled Tylenol  and Toradol .  Allergic to tramadol - Continue oral oxycodone  as needed for moderate to severe pain  Chronic left leg wound, chronic venous stasis ulcer - Recent skin graft, follows [facility] plastic surgery - Wound care consulted   History of PMR, rheumatoid arthritis History of lupus -Outpatient follow-up with  PCP, has followed [facility] rheumatology prior to us   History of diabetes mellitus type 2, diet controlled - Hemoglobin A1c 5.7, CBGs controlled, not on any oral hypoglycemics  Anemia of chronic disease - H&H appears close to baseline 10-11 - Hemoglobin at baseline  Lymphadenopathy in the bilateral axillary regions, abdomen and pelvis on CT abdomen  -CT findings concerning for lymphoproliferative disorder, no significant change in abdominal and pelvic adenopathy since prior study.   - Also previously seen on the CT abdomen on 03/29/2024 and 01/06/2023.  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  - Appreciate oncology, Dr. Lonn recommendations, LAD likely reactive secondary to untreated lupus, no indication for biopsy and recommended repeat imaging study in outpatient settings in 3 months.  Will arrange for 1 month follow-up in the clinic.   Estimated body mass index is 26.5 kg/m as calculated from the following:   Height as of this encounter: 5' 11 (1.803 m).   Weight as of this encounter: 86.2 kg.  Code Status: DNR DVT Prophylaxis:  enoxaparin  (LOVENOX )  injection 40 mg Start: 10/19/24 1600   Level of Care: Level of care: Telemetry Family Communication: Updated patient Disposition Plan:      Remains inpatient appropriate:   PT OT evaluation recommended SNF   Procedures:    Consultants:   Oncology  Antimicrobials:   Anti-infectives (From admission, onward)    Start     Dose/Rate Route Frequency Ordered Stop   10/19/24 0500  cefTRIAXone  (ROCEPHIN ) 2 g in sodium chloride  0.9 % 100 mL IVPB        2 g 200 mL/hr over 30 Minutes Intravenous Every 24 hours 10/18/24 2135     10/18/24 1815  ceFEPIme (MAXIPIME) 2 g in sodium chloride  0.9 % 100 mL IVPB        2 g 200 mL/hr over 30 Minutes Intravenous  Once 10/18/24 1812 10/18/24 1933           Medications  ascorbic acid   500 mg Oral Daily   enoxaparin  (LOVENOX ) injection  40 mg Subcutaneous Q24H   feeding supplement  237 mL Oral BID BM   melatonin  5 mg Oral QHS   triamcinolone ointment   Topical Once per day on Monday Thursday      Subjective:   Diana Sanchez was seen and examined today.  States has pain on the back, right side, feels generalized weakness and uncomfortable going home as she lives alone and has 15-18 stairs, family lives in [place].  Objective:   Vitals:   10/19/24 0856 10/19/24 1414 10/19/24 2208 10/20/24 0607  BP: 129/61 117/70 (!) 148/76 126/68  Pulse: 82 80 78 83  Resp: 16 17 16 14   Temp: 97.8 F (36.6 C) 98.7 F (37.1 C) 97.8 F (36.6 C) 98.2 F (36.8 C)  TempSrc: Oral Oral Oral Oral  SpO2: 100% 100%  98%  Weight:      Height:        Intake/Output Summary (Last 24 hours) at 10/20/2024 1240 Last data filed at 10/20/2024 0934 Gross per 24 hour  Intake 940 ml  Output 250 ml  Net 690 ml     Wt Readings from Last 3 Encounters:  10/18/24 86.2 kg  09/07/22 86.2 kg  03/30/22 87.8 kg    Physical Exam General: Alert and oriented x 3, NAD Cardiovascular: S1 S2 clear, RRR.  Respiratory: CTAB, no wheezing Gastrointestinal: Soft, nontender, nondistended, NBS Ext: no pedal edema bilaterally Neuro: no new deficits Skin: Dressing intact on the left lower leg Psych: Normal affect     Data Reviewed:  I have personally reviewed following labs    CBC Lab Results  Component Value Date   WBC 6.3 10/20/2024   RBC 3.90 10/20/2024   HGB 10.4 (L) 10/20/2024   HCT 33.6 (L) 10/20/2024   MCV 86.2 10/20/2024   MCH 26.7 10/20/2024   PLT 250 10/20/2024   MCHC 31.0 10/20/2024   RDW 14.6 10/20/2024   LYMPHSABS 1.1 10/18/2024   MONOABS 0.6 10/18/2024   EOSABS 0.0 10/18/2024   BASOSABS 0.0 10/18/2024     Last metabolic panel Lab Results  Component Value Date   NA 132 (L) 10/20/2024   K 3.9 10/20/2024   CL 100 10/20/2024    CO2 19 (L) 10/20/2024   BUN 17 10/20/2024   CREATININE 0.63 10/20/2024   GLUCOSE 92 10/20/2024   GFRNONAA >60 10/20/2024   CALCIUM  8.9 10/20/2024   PHOS 3.0 10/20/2024   PROT 8.5 (H) 10/18/2024   ALBUMIN 3.3 (L) 10/20/2024   BILITOT 0.5 10/18/2024  ALKPHOS 93 10/18/2024   AST 23 10/18/2024   ALT 9 10/18/2024   ANIONGAP 13 10/20/2024    CBG (last 3)  No results for input(s): GLUCAP in the last 72 hours.    Coagulation Profile: Recent Labs  Lab 10/18/24 1557  INR 1.0     Radiology Studies: I have personally reviewed the imaging studies  CT ABDOMEN PELVIS W CONTRAST Result Date: 10/18/2024 CLINICAL DATA:  Right lower quadrant pain, right upper quadrant pain, fever and chills, questionable sepsis, concern for pulmonary embolus EXAM: CT ANGIOGRAPHY CHEST CT ABDOMEN AND PELVIS WITH CONTRAST TECHNIQUE: Multidetector CT imaging of the chest was performed using the standard protocol during bolus administration of intravenous contrast. Multiplanar CT image reconstructions and MIPs were obtained to evaluate the vascular anatomy. Multidetector CT imaging of the abdomen and pelvis was performed using the standard protocol during bolus administration of intravenous contrast. RADIATION DOSE REDUCTION: This exam was performed according to the departmental dose-optimization program which includes automated exposure control, adjustment of the mA and/or kV according to patient size and/or use of iterative reconstruction technique. CONTRAST:  OMNIPAQUE  IOHEXOL  350 MG/ML SOLN COMPARISON:  10/18/2024, 03/29/2024 FINDINGS: CTA CHEST FINDINGS Cardiovascular: This is a technically adequate evaluation of the pulmonary vasculature. No filling defects or pulmonary emboli. The heart is unremarkable without pericardial effusion. No evidence of thoracic aortic aneurysm or dissection. Atherosclerosis of the aorta and coronary vasculature. Mediastinum/Nodes: Thyroid, trachea, and esophagus are unremarkable.  Bilateral axillary lymphadenopathy, measuring up to 13 mm on the left and 10 mm on the right. No mediastinal or hilar adenopathy. Lungs/Pleura: No acute airspace disease, effusion, or pneumothorax. The central airways are patent. Musculoskeletal: No acute or destructive bony abnormalities. Reconstructed images demonstrate no additional findings. Review of the MIP images confirms the above findings. CT ABDOMEN and PELVIS FINDINGS Hepatobiliary: Multiple calcified gallstones layer dependently within the gallbladder. No evidence of acute cholecystitis. Stable appearance of the liver. No biliary duct dilation or choledocholithiasis. Pancreas: Unremarkable. No pancreatic ductal dilatation or surrounding inflammatory changes. Spleen: Normal in size without focal abnormality. Adrenals/Urinary Tract: Assessment of the distal ureters and bladder is limited by streak artifact from bilateral hip arthroplasties. The bladder is decompressed. There is fat stranding surrounding the left renal pelvis and proximal left ureter, with proximal left ureteral wall thickening and mucosal hyperenhancement, consistent with urinary tract infection. Slightly heterogeneous enhancement of the left renal parenchyma, most pronounced on delayed imaging, consistent with pyelonephritis. No renal abscess. The right kidney is unremarkable.  The adrenals are normal. Stomach/Bowel: No bowel obstruction or ileus. Normal appendix right lower quadrant. No bowel wall thickening or inflammatory change. Vascular/Lymphatic: Stable atherosclerosis of the abdominal aorta. There is continued lymphadenopathy throughout the abdomen and pelvis, not appreciably changed since prior study. Index right external iliac chain lymph node measures 11 mm image 54/2 and left external iliac chain lymph node 19 mm image 57/2, stable. Reproductive: Uterus and bilateral adnexa are unremarkable. Other: No free fluid or free intraperitoneal gas. Small fat containing umbilical  hernia. No bowel herniation. Musculoskeletal: Bilateral hip arthroplasties. No acute or destructive bony abnormalities. Reconstructed images demonstrate no additional findings. Review of the MIP images confirms the above findings. IMPRESSION: 1. Findings consistent with left-sided pyelonephritis and ascending urinary tract infection. No evidence of renal abscess. 2. Lymphadenopathy within the bilateral axillary regions, abdomen, and pelvis, concerning for lymphoproliferative disorder. No significant change in the abdominal and pelvic adenopathy since prior study. 3. Cholelithiasis without evidence of cholecystitis. 4. No evidence of pulmonary embolus.  5.  Aortic Atherosclerosis (ICD10-I70.0). Electronically Signed   By: Ozell Daring M.D.   On: 10/18/2024 17:44   CT Angio Chest PE W and/or Wo Contrast Result Date: 10/18/2024 CLINICAL DATA:  Right lower quadrant pain, right upper quadrant pain, fever and chills, questionable sepsis, concern for pulmonary embolus EXAM: CT ANGIOGRAPHY CHEST CT ABDOMEN AND PELVIS WITH CONTRAST TECHNIQUE: Multidetector CT imaging of the chest was performed using the standard protocol during bolus administration of intravenous contrast. Multiplanar CT image reconstructions and MIPs were obtained to evaluate the vascular anatomy. Multidetector CT imaging of the abdomen and pelvis was performed using the standard protocol during bolus administration of intravenous contrast. RADIATION DOSE REDUCTION: This exam was performed according to the departmental dose-optimization program which includes automated exposure control, adjustment of the mA and/or kV according to patient size and/or use of iterative reconstruction technique. CONTRAST:  OMNIPAQUE  IOHEXOL  350 MG/ML SOLN COMPARISON:  10/18/2024, 03/29/2024 FINDINGS: CTA CHEST FINDINGS Cardiovascular: This is a technically adequate evaluation of the pulmonary vasculature. No filling defects or pulmonary emboli. The heart is  unremarkable without pericardial effusion. No evidence of thoracic aortic aneurysm or dissection. Atherosclerosis of the aorta and coronary vasculature. Mediastinum/Nodes: Thyroid, trachea, and esophagus are unremarkable. Bilateral axillary lymphadenopathy, measuring up to 13 mm on the left and 10 mm on the right. No mediastinal or hilar adenopathy. Lungs/Pleura: No acute airspace disease, effusion, or pneumothorax. The central airways are patent. Musculoskeletal: No acute or destructive bony abnormalities. Reconstructed images demonstrate no additional findings. Review of the MIP images confirms the above findings. CT ABDOMEN and PELVIS FINDINGS Hepatobiliary: Multiple calcified gallstones layer dependently within the gallbladder. No evidence of acute cholecystitis. Stable appearance of the liver. No biliary duct dilation or choledocholithiasis. Pancreas: Unremarkable. No pancreatic ductal dilatation or surrounding inflammatory changes. Spleen: Normal in size without focal abnormality. Adrenals/Urinary Tract: Assessment of the distal ureters and bladder is limited by streak artifact from bilateral hip arthroplasties. The bladder is decompressed. There is fat stranding surrounding the left renal pelvis and proximal left ureter, with proximal left ureteral wall thickening and mucosal hyperenhancement, consistent with urinary tract infection. Slightly heterogeneous enhancement of the left renal parenchyma, most pronounced on delayed imaging, consistent with pyelonephritis. No renal abscess. The right kidney is unremarkable.  The adrenals are normal. Stomach/Bowel: No bowel obstruction or ileus. Normal appendix right lower quadrant. No bowel wall thickening or inflammatory change. Vascular/Lymphatic: Stable atherosclerosis of the abdominal aorta. There is continued lymphadenopathy throughout the abdomen and pelvis, not appreciably changed since prior study. Index right external iliac chain lymph node measures 11 mm  image 54/2 and left external iliac chain lymph node 19 mm image 57/2, stable. Reproductive: Uterus and bilateral adnexa are unremarkable. Other: No free fluid or free intraperitoneal gas. Small fat containing umbilical hernia. No bowel herniation. Musculoskeletal: Bilateral hip arthroplasties. No acute or destructive bony abnormalities. Reconstructed images demonstrate no additional findings. Review of the MIP images confirms the above findings. IMPRESSION: 1. Findings consistent with left-sided pyelonephritis and ascending urinary tract infection. No evidence of renal abscess. 2. Lymphadenopathy within the bilateral axillary regions, abdomen, and pelvis, concerning for lymphoproliferative disorder. No significant change in the abdominal and pelvic adenopathy since prior study. 3. Cholelithiasis without evidence of cholecystitis. 4. No evidence of pulmonary embolus. 5.  Aortic Atherosclerosis (ICD10-I70.0). Electronically Signed   By: Ozell Daring M.D.   On: 10/18/2024 17:44   DG Chest Port 1 View Result Date: 10/18/2024 CLINICAL DATA:  Questionable sepsis. EXAM: PORTABLE CHEST 1 VIEW  COMPARISON:  Chest radiograph dated 09/07/2022. FINDINGS: No focal consolidation, pleural effusion or pneumothorax. The cardiac silhouette is within normal limits. No acute osseous pathology. IMPRESSION: No active disease. Electronically Signed   By: Vanetta Chou M.D.   On: 10/18/2024 16:43       Sitlali Koerner M.D. [facility] 10/20/2024, 12:40 PM  Available via Epic secure chat 7am-7pm After 7 pm, please refer to night coverage provider listed on amion.

## 2024-10-20 NOTE — Evaluation (Signed)
 Physical Therapy Evaluation Patient Details Name: Diana Sanchez MRN: [id number] DOB: December 03, 1956 Today's Date: 10/20/2024  History of Present Illness  Diana Sanchez is a 68 y.o. female CT revealed pyelonephritis PMH: lupus, Polymyalgia rheumatica, lymphedema, calcinosis cutis, venous stasis with a chronic venous stasis ulcer of her left lower leg  Clinical Impression  Pt very cooperative but requiring increased time for tasks, varying levels of assist and fatigues easily.  Patient will benefit from continued inpatient follow up therapy, <3 hours/day to maximize IND and safety prior to return to 2 level town home (bedrooms and bath upstairs) with NO assistance.           If plan is discharge home, recommend the following: A little help with walking and/or transfers;A little help with bathing/dressing/bathroom;Assistance with cooking/housework;Assist for transportation;Help with stairs or ramp for entrance   Can travel by private vehicle   Yes    Equipment Recommendations None recommended by PT  Recommendations for Other Services       Functional Status Assessment       Precautions / Restrictions Precautions Precautions: None;Fall Restrictions Weight Bearing Restrictions Per Provider Order: No      Mobility  Bed Mobility               General bed mobility comments: Pt up in recliner and returns to same    Transfers Overall transfer level: Needs assistance Equipment used: Rolling walker (2 wheels) Transfers: Sit to/from Stand Sit to Stand: Min assist           General transfer comment: increased time, cues for hand placement; physical assist to bring wt up and fwd and balance in initial standing with RW    Ambulation/Gait Ambulation/Gait assistance: Min assist Gait Distance (Feet): 23 Feet Assistive device: Rolling walker (2 wheels) Gait Pattern/deviations: Step-to pattern, Decreased step length - right, Decreased step length - left,  Shuffle, Trunk flexed Gait velocity: decr     General Gait Details: INcreased time, Steady assist, cues for posture, position from RW; noted gait deviation 2* significant LLD with R LE shorter  Stairs            Wheelchair Mobility     Tilt Bed    Modified Rankin (Stroke Patients Only)       Balance Overall balance assessment: Needs assistance Sitting-balance support: No upper extremity supported, Feet supported Sitting balance-Leahy Scale: Good     Standing balance support: Reliant on assistive device for balance Standing balance-Leahy Scale: Poor Standing balance comment: LLD on R and flexed hips reliant on device                             Pertinent Vitals/Pain Pain Assessment Pain Assessment: 0-10 Pain Score: 5  Pain Location: All over but most notably L shoulder and LE Pain Descriptors / Indicators: Aching, Sore Pain Intervention(s): Limited activity within patient's tolerance, Monitored during session, Premedicated before session    Home Living                          Prior Function                       Extremity/Trunk Assessment                Communication   Communication Communication: No apparent difficulties    Cognition Arousal: Alert Behavior During Therapy: [facility] for  tasks assessed/performed   PT - Cognitive impairments: No apparent impairments                         Following commands: Intact       Cueing Cueing Techniques: Verbal cues     General Comments      Exercises     Assessment/Plan    PT Assessment    PT Problem List         PT Treatment Interventions      PT Goals (Current goals can be found in the Care Plan section)  Acute Rehab PT Goals Patient Stated Goal: Regain strength and IND PT Goal Formulation: With patient Time For Goal Achievement: 10/26/24 Potential to Achieve Goals: Good    Frequency Min 2X/week     Co-evaluation                AM-PAC PT 6 Clicks Mobility  Outcome Measure Help needed turning from your back to your side while in a flat bed without using bedrails?: A Little Help needed moving from lying on your back to sitting on the side of a flat bed without using bedrails?: A Little Help needed moving to and from a bed to a chair (including a wheelchair)?: A Little Help needed standing up from a chair using your arms (e.g., wheelchair or bedside chair)?: A Little Help needed to walk in hospital room?: A Little Help needed climbing 3-5 steps with a railing? : Total 6 Click Score: 16    End of Session Equipment Utilized During Treatment: Gait belt Activity Tolerance: Patient tolerated treatment well;Patient limited by fatigue Patient left: in chair;with call bell/phone within reach;with chair alarm set Nurse Communication: Mobility status PT Visit Diagnosis: Difficulty in walking, not elsewhere classified (R26.2);Muscle weakness (generalized) (M62.81);Pain Pain - part of body:  (all over)    Time: 8479-8458 PT Time Calculation (min) (ACUTE ONLY): 21 min   Charges:     PT Treatments $Gait Training: 8-22 mins PT General Charges $$ ACUTE PT VISIT: 1 Visit         [facility] PT Acute Rehabilitation Services Office [phone]   Jaylenn Altier 10/20/2024, 4:30 PM

## 2024-10-20 NOTE — Plan of Care (Signed)
 Patient education progressing

## 2024-10-20 NOTE — Plan of Care (Signed)
  Problem: Safety: Goal: Ability to remain free from injury will improve Outcome: Progressing   Problem: Pain Managment: Goal: General experience of comfort will improve and/or be controlled Outcome: Progressing   Problem: Elimination: Goal: Will not experience complications related to urinary retention Outcome: Progressing

## 2024-10-21 DIAGNOSIS — R52 Pain, unspecified: Secondary | ICD-10-CM | POA: Diagnosis not present

## 2024-10-21 DIAGNOSIS — N10 Acute tubulo-interstitial nephritis: Secondary | ICD-10-CM | POA: Diagnosis not present

## 2024-10-21 DIAGNOSIS — Z86718 Personal history of other venous thrombosis and embolism: Secondary | ICD-10-CM

## 2024-10-21 DIAGNOSIS — N12 Tubulo-interstitial nephritis, not specified as acute or chronic: Secondary | ICD-10-CM | POA: Diagnosis not present

## 2024-10-21 DIAGNOSIS — B9620 Unspecified Escherichia coli [E. coli] as the cause of diseases classified elsewhere: Secondary | ICD-10-CM

## 2024-10-21 DIAGNOSIS — R7881 Bacteremia: Secondary | ICD-10-CM

## 2024-10-21 LAB — CULTURE, BLOOD (ROUTINE X 2): Special Requests: ADEQUATE

## 2024-10-21 LAB — RENAL FUNCTION PANEL
Albumin: 3.3 g/dL — ABNORMAL LOW (ref 3.5–5.0)
Anion gap: 10 (ref 5–15)
BUN: 30 mg/dL — ABNORMAL HIGH (ref 8–23)
CO2: 22 mmol/L (ref 22–32)
Calcium: 9.1 mg/dL (ref 8.9–10.3)
Chloride: 102 mmol/L (ref 98–111)
Creatinine, Ser: 1.03 mg/dL — ABNORMAL HIGH (ref 0.44–1.00)
GFR, Estimated: 59 mL/min — ABNORMAL LOW (ref >60)
Glucose, Bld: 104 mg/dL — ABNORMAL HIGH (ref 70–99)
Phosphorus: 4.1 mg/dL (ref 2.5–4.6)
Potassium: 4.1 mmol/L (ref 3.5–5.1)
Sodium: 133 mmol/L — ABNORMAL LOW (ref 135–145)

## 2024-10-21 LAB — CBC
HCT: 32.1 % — ABNORMAL LOW (ref 36.0–46.0)
Hemoglobin: 9.6 g/dL — ABNORMAL LOW (ref 12.0–15.0)
MCH: 26 pg (ref 26.0–34.0)
MCHC: 29.9 g/dL — ABNORMAL LOW (ref 30.0–36.0)
MCV: 87 fL (ref 80.0–100.0)
Platelets: 296 K/uL (ref 150–400)
RBC: 3.69 MIL/uL — ABNORMAL LOW (ref 3.87–5.11)
RDW: 14.7 % (ref 11.5–15.5)
WBC: 4.7 K/uL (ref 4.0–10.5)
nRBC: 0 % (ref 0.0–0.2)

## 2024-10-21 MED ORDER — LACTULOSE 10 GM/15ML PO SOLN
20.0000 g | Freq: Once | ORAL | Status: AC
  Administered 2024-10-21: 20 g via ORAL
  Filled 2024-10-21: qty 30

## 2024-10-21 MED ORDER — SENNOSIDES-DOCUSATE SODIUM 8.6-50 MG PO TABS
1.0000 | ORAL_TABLET | Freq: Two times a day (BID) | ORAL | Status: DC
  Administered 2024-10-21 – 2024-10-23 (×4): 1 via ORAL
  Filled 2024-10-21 (×5): qty 1

## 2024-10-21 MED ORDER — POLYETHYLENE GLYCOL 3350 17 G PO PACK
17.0000 g | PACK | Freq: Every day | ORAL | Status: DC
  Administered 2024-10-21 – 2024-10-23 (×3): 17 g via ORAL
  Filled 2024-10-21 (×3): qty 1

## 2024-10-21 NOTE — Progress Notes (Signed)
 [facility]                                                                              [place], is a 68 y.o. female, DOB - [birth year], FMW:[id number] Admit date - 10/18/2024    Outpatient Primary MD for the patient is Delores Rojelio Caldron, NP  LOS - 1  days  Chief Complaint  Patient presents with   Fever   Generalized Body Aches       Brief summary   Patient is a 68 year old female with history of lupus, PMR, lymphedema, venous stasis with chronic venous stasis ulcer on the left lower leg for many years, follows with plastic surgery at [facility], had skin graft placed 2 weeks ago on 10/14.  Per patient, this was outpatient procedure and did not require Foley catheter.  A week ago she felt fever and chills with myalgias, dysuria, increased urinary frequency.  She has been incontinent of urine for the last week which is not her baseline.  No nausea vomiting but poor appetite.  Has been drinking only liquids.  CT chest abdomen pelvis in ED showed left-sided pyelonephritis and ascending UTI, no renal abscess.  Lymphadenopathy with bilateral axillary regions, abdominal pelvis concerning for lymphoproliferative disorder, no significant change in the abdominal and pelvic adenopathy since prior study.  Cholelithiasis without evidence of cholecystitis.  Assessment & Plan      Acute left-sided pyelonephritis, E. coli UTI E. coli bacteremia -Urine culture shows >100,000 colonies of E. coli  - Blood cultures positive for E. Coli, repeat blood cultures so far NTD - Continue Rocephin  2 g IV daily   Acute on chronic pain - Placed on scheduled Tylenol  and Toradol .  Allergic to tramadol - Continue oral oxycodone  as needed for moderate to severe pain - Feeling better with the pain today, states Toradol  is helping  Chronic left leg wound, chronic venous stasis ulcer - Recent skin graft, follows [facility] plastic surgery - Wound care consulted   History of PMR, rheumatoid  arthritis History of lupus -Outpatient follow-up with PCP, has followed [facility] rheumatology prior to us   History of diabetes mellitus type 2, diet controlled - Hemoglobin A1c 5.7, CBGs controlled, not on any oral hypoglycemics  Anemia of chronic disease - H&H appears close to baseline 10-11 - H&H close to baseline   Lymphadenopathy in the bilateral axillary regions, abdomen and pelvis on CT abdomen  -CT findings concerning for lymphoproliferative disorder, no significant change in abdominal and pelvic adenopathy since prior study.   - Also previously seen on the CT abdomen on 03/29/2024 and 01/06/2023.  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  - Appreciate oncology, Dr. Lonn recommendations, LAD likely reactive secondary to untreated lupus, no indication for biopsy and recommended repeat imaging study in outpatient settings in 3 months.  Will arrange for 1 month follow-up in the clinic.  Constipation - Placed on Senokot-S, Protonix , lactulose x 1   Estimated body mass index is 26.5 kg/m as calculated from the following:   Height as of this  encounter: 5' 11 (1.803 m).   Weight as of this encounter: 86.2 kg.  Code Status: DNR DVT Prophylaxis:  enoxaparin  (LOVENOX ) injection 40 mg Start: 10/19/24 1600   Level of Care: Level of care: Telemetry Family Communication: Updated patient Disposition Plan:      Remains inpatient appropriate:   PT OT evaluation recommended SNF   Procedures:    Consultants:   Oncology  Antimicrobials:   Anti-infectives (From admission, onward)    Start     Dose/Rate Route Frequency Ordered Stop   10/19/24 0500  cefTRIAXone  (ROCEPHIN ) 2 g in sodium chloride  0.9 % 100 mL IVPB        2 g 200 mL/hr over 30 Minutes Intravenous Every 24 hours 10/18/24 2135     10/18/24 1815  ceFEPIme (MAXIPIME) 2 g in  sodium chloride  0.9 % 100 mL IVPB        2 g 200 mL/hr over 30 Minutes Intravenous  Once 10/18/24 1812 10/18/24 1933          Medications  acetaminophen   1,000 mg Oral Q8H   ascorbic acid   500 mg Oral Daily   enoxaparin  (LOVENOX ) injection  40 mg Subcutaneous Q24H   feeding supplement  237 mL Oral BID BM   ketorolac   15 mg Intravenous Q8H   melatonin  5 mg Oral QHS   pantoprazole   40 mg Oral Q0600   polyethylene glycol  17 g Oral Daily   senna-docusate  1 tablet Oral BID   triamcinolone ointment   Topical Once per day on Monday Thursday      Subjective:   Diana Sanchez was seen and examined today.  States pain is better today, overall feeling better.  No acute nausea vomiting, chest pain, shortness of breath, fevers.    Objective:   Vitals:   10/20/24 0607 10/20/24 1403 10/20/24 2215 10/21/24 0508  BP: 126/68 121/72 107/66 (!) 129/56  Pulse: 83 83 79 73  Resp: 14 18 19 19   Temp: 98.2 F (36.8 C) 98.1 F (36.7 C) 98.1 F (36.7 C) 97.7 F (36.5 C)  TempSrc: Oral Oral    SpO2: 98% 100% 100% 100%  Weight:      Height:        Intake/Output Summary (Last 24 hours) at 10/21/2024 1122 Last data filed at 10/21/2024 9392 Gross per 24 hour  Intake 360 ml  Output 0 ml  Net 360 ml     Wt Readings from Last 3 Encounters:  10/18/24 86.2 kg  09/07/22 86.2 kg  03/30/22 87.8 kg   Physical Exam General: Alert and oriented x 3, NAD Cardiovascular: S1 S2 clear, RRR.  Respiratory: CTAB Gastrointestinal: Soft, nontender, nondistended, NBS Ext: no pedal edema bilaterally Neuro: no new deficits Skin: Dressing intact on the left lower leg Psych: Normal affect     Data Reviewed:  I have personally reviewed following labs    CBC Lab Results  Component Value Date   WBC 4.7 10/21/2024   RBC 3.69 (L) 10/21/2024   HGB 9.6 (L) 10/21/2024   HCT 32.1 (L) 10/21/2024   MCV 87.0 10/21/2024   MCH 26.0 10/21/2024   PLT 296 10/21/2024   MCHC 29.9 (L) 10/21/2024   RDW  14.7 10/21/2024   LYMPHSABS 1.1 10/18/2024   MONOABS 0.6 10/18/2024   EOSABS 0.0 10/18/2024   BASOSABS 0.0 10/18/2024     Last metabolic panel Lab Results  Component Value Date   NA 133 (L) 10/21/2024   K 4.1 10/21/2024   CL  102 10/21/2024   CO2 22 10/21/2024   BUN 30 (H) 10/21/2024   CREATININE 1.03 (H) 10/21/2024   GLUCOSE 104 (H) 10/21/2024   GFRNONAA 59 (L) 10/21/2024   CALCIUM  9.1 10/21/2024   PHOS 4.1 10/21/2024   PROT 8.5 (H) 10/18/2024   ALBUMIN 3.3 (L) 10/21/2024   BILITOT 0.5 10/18/2024   ALKPHOS 93 10/18/2024   AST 23 10/18/2024   ALT 9 10/18/2024   ANIONGAP 10 10/21/2024    CBG (last 3)  No results for input(s): GLUCAP in the last 72 hours.    Coagulation Profile: Recent Labs  Lab 10/18/24 1557  INR 1.0     Radiology Studies: I have personally reviewed the imaging studies  No results found.      Nydia Distance M.D. [facility] 10/21/2024, 11:22 AM  Available via Epic secure chat 7am-7pm After 7 pm, please refer to night coverage provider listed on amion.

## 2024-10-21 NOTE — Plan of Care (Signed)
  Problem: Safety: Goal: Ability to remain free from injury will improve Outcome: Progressing   Problem: Pain Managment: Goal: General experience of comfort will improve and/or be controlled Outcome: Progressing   Problem: Coping: Goal: Level of anxiety will decrease Outcome: Progressing

## 2024-10-21 NOTE — Progress Notes (Signed)
 Physical Therapy Treatment Patient Details Name: Diana Sanchez MRN: [id number] DOB: 1956/09/04 Today's Date: 10/21/2024   History of Present Illness Diana Sanchez is a 68 y.o. female CT revealed pyelonephritis PMH: lupus, Polymyalgia rheumatica, lymphedema, calcinosis cutis, venous stasis with a chronic venous stasis ulcer of her left lower leg    PT Comments  Pt continues very cooperative but progressing slowly with mobility and requires increased time for task completion.  Patient will benefit from continued inpatient follow up therapy, <3 hours/day to maximize IND and safety prior to return to 2 story home with very limited assist.     If plan is discharge home, recommend the following: A little help with walking and/or transfers;A little help with bathing/dressing/bathroom;Assistance with cooking/housework;Assist for transportation;Help with stairs or ramp for entrance   Can travel by private vehicle     Yes  Equipment Recommendations  None recommended by PT    Recommendations for Other Services       Precautions / Restrictions Precautions Precautions: None;Fall Restrictions Weight Bearing Restrictions Per Provider Order: No     Mobility  Bed Mobility Overal bed mobility: Needs Assistance Bed Mobility: Supine to Sit     Supine to sit: Supervision, HOB elevated, Used rails     General bed mobility comments: Increased time with use of rails but no physical assist    Transfers Overall transfer level: Needs assistance Equipment used: Rolling walker (2 wheels) Transfers: Sit to/from Stand Sit to Stand: Min assist           General transfer comment: increased time, cues for hand placement; physical assist to bring wt up and fwd and balance in initial standing with RW    Ambulation/Gait Ambulation/Gait assistance: Min assist, Contact guard assist Gait Distance (Feet): 31 Feet Assistive device: Rolling walker (2 wheels) Gait  Pattern/deviations: Step-to pattern, Decreased step length - right, Decreased step length - left, Shuffle, Trunk flexed Gait velocity: decr     General Gait Details: INcreased time, Steady assist, cues for posture, position from RW; noted gait deviation 2* significant LLD with R LE shorter   Stairs             Wheelchair Mobility     Tilt Bed    Modified Rankin (Stroke Patients Only)       Balance Overall balance assessment: Needs assistance Sitting-balance support: No upper extremity supported, Feet supported Sitting balance-Leahy Scale: Good     Standing balance support: Reliant on assistive device for balance Standing balance-Leahy Scale: Poor                              Communication Communication Communication: No apparent difficulties  Cognition Arousal: Alert Behavior During Therapy: WFL for tasks assessed/performed   PT - Cognitive impairments: No apparent impairments                         Following commands: Intact      Cueing Cueing Techniques: Verbal cues  Exercises      General Comments        Pertinent Vitals/Pain Pain Assessment Pain Assessment: 0-10 Pain Score: 5  Pain Location: All over but most notably L shoulder and LE Pain Descriptors / Indicators: Aching, Sore Pain Intervention(s): Limited activity within patient's tolerance, Monitored during session    Home Living  Prior Function            PT Goals (current goals can now be found in the care plan section) Acute Rehab PT Goals Patient Stated Goal: Regain strength and IND PT Goal Formulation: With patient Time For Goal Achievement: 10/26/24 Potential to Achieve Goals: Good Progress towards PT goals: Progressing toward goals    Frequency    Min 2X/week      PT Plan      Co-evaluation              AM-PAC PT 6 Clicks Mobility   Outcome Measure  Help needed turning from your back to your side  while in a flat bed without using bedrails?: A Little Help needed moving from lying on your back to sitting on the side of a flat bed without using bedrails?: A Little Help needed moving to and from a bed to a chair (including a wheelchair)?: A Little Help needed standing up from a chair using your arms (e.g., wheelchair or bedside chair)?: A Little Help needed to walk in hospital room?: A Little Help needed climbing 3-5 steps with a railing? : Total 6 Click Score: 16    End of Session Equipment Utilized During Treatment: Gait belt Activity Tolerance: Patient tolerated treatment well;Patient limited by fatigue Patient left: in chair;with call bell/phone within reach;with chair alarm set Nurse Communication: Mobility status PT Visit Diagnosis: Difficulty in walking, not elsewhere classified (R26.2);Muscle weakness (generalized) (M62.81);Pain     Time: 1351-1420 PT Time Calculation (min) (ACUTE ONLY): 29 min  Charges:    $Gait Training: 8-22 mins PT General Charges $$ ACUTE PT VISIT: 1 Visit                     [facility] PT Acute Rehabilitation Services Office [phone]    Vyla Pint 10/21/2024, 3:39 PM

## 2024-10-22 DIAGNOSIS — M159 Polyosteoarthritis, unspecified: Secondary | ICD-10-CM | POA: Diagnosis present

## 2024-10-22 DIAGNOSIS — I700 Atherosclerosis of aorta: Secondary | ICD-10-CM | POA: Diagnosis present

## 2024-10-22 DIAGNOSIS — R7303 Prediabetes: Secondary | ICD-10-CM | POA: Diagnosis not present

## 2024-10-22 DIAGNOSIS — L97929 Non-pressure chronic ulcer of unspecified part of left lower leg with unspecified severity: Secondary | ICD-10-CM | POA: Diagnosis present

## 2024-10-22 DIAGNOSIS — N10 Acute tubulo-interstitial nephritis: Secondary | ICD-10-CM | POA: Diagnosis present

## 2024-10-22 DIAGNOSIS — K5900 Constipation, unspecified: Secondary | ICD-10-CM | POA: Diagnosis present

## 2024-10-22 DIAGNOSIS — N12 Tubulo-interstitial nephritis, not specified as acute or chronic: Secondary | ICD-10-CM | POA: Diagnosis present

## 2024-10-22 DIAGNOSIS — G8929 Other chronic pain: Secondary | ICD-10-CM | POA: Diagnosis present

## 2024-10-22 DIAGNOSIS — I10 Essential (primary) hypertension: Secondary | ICD-10-CM | POA: Diagnosis present

## 2024-10-22 DIAGNOSIS — K8070 Calculus of gallbladder and bile duct without cholecystitis without obstruction: Secondary | ICD-10-CM | POA: Diagnosis present

## 2024-10-22 DIAGNOSIS — Z5941 Food insecurity: Secondary | ICD-10-CM | POA: Diagnosis not present

## 2024-10-22 DIAGNOSIS — L97909 Non-pressure chronic ulcer of unspecified part of unspecified lower leg with unspecified severity: Secondary | ICD-10-CM | POA: Diagnosis not present

## 2024-10-22 DIAGNOSIS — Z885 Allergy status to narcotic agent status: Secondary | ICD-10-CM | POA: Diagnosis not present

## 2024-10-22 DIAGNOSIS — M329 Systemic lupus erythematosus, unspecified: Secondary | ICD-10-CM | POA: Diagnosis present

## 2024-10-22 DIAGNOSIS — Z8249 Family history of ischemic heart disease and other diseases of the circulatory system: Secondary | ICD-10-CM | POA: Diagnosis not present

## 2024-10-22 DIAGNOSIS — E1165 Type 2 diabetes mellitus with hyperglycemia: Secondary | ICD-10-CM

## 2024-10-22 DIAGNOSIS — Z96641 Presence of right artificial hip joint: Secondary | ICD-10-CM | POA: Diagnosis present

## 2024-10-22 DIAGNOSIS — N390 Urinary tract infection, site not specified: Secondary | ICD-10-CM | POA: Diagnosis present

## 2024-10-22 DIAGNOSIS — E7800 Pure hypercholesterolemia, unspecified: Secondary | ICD-10-CM | POA: Diagnosis present

## 2024-10-22 DIAGNOSIS — Z1152 Encounter for screening for COVID-19: Secondary | ICD-10-CM | POA: Diagnosis not present

## 2024-10-22 DIAGNOSIS — D638 Anemia in other chronic diseases classified elsewhere: Secondary | ICD-10-CM | POA: Diagnosis present

## 2024-10-22 DIAGNOSIS — M069 Rheumatoid arthritis, unspecified: Secondary | ICD-10-CM | POA: Diagnosis present

## 2024-10-22 DIAGNOSIS — M353 Polymyalgia rheumatica: Secondary | ICD-10-CM | POA: Diagnosis not present

## 2024-10-22 DIAGNOSIS — Z7982 Long term (current) use of aspirin: Secondary | ICD-10-CM | POA: Diagnosis not present

## 2024-10-22 DIAGNOSIS — R7881 Bacteremia: Secondary | ICD-10-CM | POA: Diagnosis present

## 2024-10-22 DIAGNOSIS — I878 Other specified disorders of veins: Secondary | ICD-10-CM | POA: Diagnosis present

## 2024-10-22 DIAGNOSIS — Z66 Do not resuscitate: Secondary | ICD-10-CM | POA: Diagnosis present

## 2024-10-22 DIAGNOSIS — B9620 Unspecified Escherichia coli [E. coli] as the cause of diseases classified elsewhere: Secondary | ICD-10-CM | POA: Diagnosis present

## 2024-10-22 LAB — CBC
HCT: 32.2 % — ABNORMAL LOW (ref 36.0–46.0)
Hemoglobin: 9.7 g/dL — ABNORMAL LOW (ref 12.0–15.0)
MCH: 26.8 pg (ref 26.0–34.0)
MCHC: 30.1 g/dL (ref 30.0–36.0)
MCV: 89 fL (ref 80.0–100.0)
Platelets: 315 K/uL (ref 150–400)
RBC: 3.62 MIL/uL — ABNORMAL LOW (ref 3.87–5.11)
RDW: 14.8 % (ref 11.5–15.5)
WBC: 3.9 K/uL — ABNORMAL LOW (ref 4.0–10.5)
nRBC: 0 % (ref 0.0–0.2)

## 2024-10-22 LAB — RENAL FUNCTION PANEL
Albumin: 3.2 g/dL — ABNORMAL LOW (ref 3.5–5.0)
Anion gap: 10 (ref 5–15)
BUN: 28 mg/dL — ABNORMAL HIGH (ref 8–23)
CO2: 20 mmol/L — ABNORMAL LOW (ref 22–32)
Calcium: 9.2 mg/dL (ref 8.9–10.3)
Chloride: 105 mmol/L (ref 98–111)
Creatinine, Ser: 0.79 mg/dL (ref 0.44–1.00)
GFR, Estimated: >60 mL/min (ref >60)
Glucose, Bld: 86 mg/dL (ref 70–99)
Phosphorus: 4 mg/dL (ref 2.5–4.6)
Potassium: 4.2 mmol/L (ref 3.5–5.1)
Sodium: 135 mmol/L (ref 135–145)

## 2024-10-22 MED ORDER — CEFAZOLIN SODIUM-DEXTROSE 2-4 GM/100ML-% IV SOLN
2.0000 g | Freq: Three times a day (TID) | INTRAVENOUS | Status: DC
  Filled 2024-10-22: qty 100

## 2024-10-22 MED ORDER — KETOROLAC TROMETHAMINE 15 MG/ML IJ SOLN
15.0000 mg | Freq: Once | INTRAMUSCULAR | Status: AC
  Administered 2024-10-22: 15 mg via INTRAVENOUS
  Filled 2024-10-22: qty 1

## 2024-10-22 NOTE — TOC Initial Note (Addendum)
 Transition of Care [facility]) - Initial/Assessment Note    Patient Details  Name: Diana Sanchez MRN: [id number] Date of Birth: 07/30/1956  [facility] CM/SW Contact:    Doneta Glenys DASEN, RN Phone Number: 10/22/2024, 4:08 PM  Clinical Narrative:                 PTA lives alone in a town house. DME, Denies HH, oxygen, and SDOH needs. CM explained recommendation for SNF and process. Patient is agreeable to starting SNF work-up. Referrals sent via HUB and PASRR started. 4:38 PM PASRR # [id number]. Waiting on bed offers.  Expected Discharge Plan: Skilled Nursing Facility Barriers to Discharge: Continued Medical Work up, SNF Pending bed offer   Patient Goals and CMS Choice Patient states their goals for this hospitalization and ongoing recovery are:: Skilled Nursing CMS Medicare.gov Compare Post Acute Care list provided to:: Patient Choice offered to / list presented to : Patient [place] ownership interest in [facility].provided to:: Patient    Expected Discharge Plan and Services In-house Referral: NA Discharge Planning Services: CM Consult Post Acute Care Choice: NA Living arrangements for the past 2 months: Single Family Home                 DME Arranged: N/A DME Agency: NA       HH Arranged: NA HH Agency: NA        Prior Living Arrangements/Services Living arrangements for the past 2 months: Single Family Home Lives with:: Self Patient language and need for interpreter reviewed:: Yes Do you feel safe going back to the place where you live?: No   Not unstill i am stronger  Need for Family Participation in Patient Care: Yes (Comment) Care giver support system in place?: No (comment) Current home services: DME (cane, rollator, [facility], [place]) Criminal Activity/Legal Involvement Pertinent to Current Situation/Hospitalization: No - Comment as needed  Activities of Daily Living   ADL Screening (condition at time of admission) Independently  performs ADLs?: Yes (appropriate for developmental age) Is the patient deaf or have difficulty hearing?: No Does the patient have difficulty seeing, even when wearing glasses/contacts?: No Does the patient have difficulty concentrating, remembering, or making decisions?: Yes  Permission Sought/Granted Permission sought to share information with : Case Manager Permission granted to share information with : Yes, Verbal Permission Granted  Share Information with NAME: Bess,Brent (Son)  [phone]  Permission granted to share info w AGENCY: SNF        Emotional Assessment Appearance:: Appears stated age Attitude/Demeanor/Rapport: Engaged Affect (typically observed): Appropriate Orientation: : Oriented to Self, Oriented to Place, Oriented to  Time, Oriented to Situation Alcohol / Substance Use: Not Applicable Psych Involvement: No (comment)  Admission diagnosis:  Pyelonephritis [N12] Acute generalized body pain [R52] Patient Active Problem List   Diagnosis Date Noted   E coli bacteremia 10/20/2024   Acute pyelonephritis 10/18/2024   Pyelonephritis 10/18/2024   Cellulitis of left foot 07/01/2022   Type 2 diabetes mellitus (HCC) 07/01/2022   Chronic skin ulcer of lower leg (HCC) 03/30/2022   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 03/30/2022   DNR (do not resuscitate)/DNI(Do Not Intubate) 03/30/2022   Leukocytosis 03/30/2022   Lymphedema, not elsewhere classified 12/13/2021   Long term (current) use of oral hypoglycemic drugs 12/13/2021   Bilateral primary osteoarthritis of knee 12/13/2021   Iron deficiency anemia 11/30/2021   Palpitations 08/27/2021   Constipation 05/09/2020   Intrinsic eczema 05/09/2020   Memory changes 05/09/2020  Hypercholesterolemia 03/23/2020   Prediabetes 03/23/2020   Lower abdominal pain 08/29/2019   Left leg swelling 08/27/2019   Abnormal mammogram 08/24/2019   Skin ulcer of left ankle with fat layer exposed (HCC) 07/03/2019   Venous  insufficiency 02/20/2019   Venous stasis dermatitis of both lower extremities 02/20/2019   Venous stasis ulcer with varicose veins (HCC) 02/20/2019   Depression 01/26/2019   GERD (gastroesophageal reflux disease) 01/26/2019   Venous stasis ulcer of left lower leg with edema of left lower leg (HCC) 01/10/2019   Bilateral hearing loss 08/01/2018   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   Personal history of MRSA (methicillin resistant Staphylococcus aureus) 06/13/2018   Migraine without status migrainosus, not intractable 04/13/2017   DDD (degenerative disc disease), lumbar 10/19/2016   Lumbar facet arthropathy 10/19/2016   Osteoarthritis of both knees 10/19/2016   Polyarthropathy, multiple sites 10/19/2016   Chronic pain syndrome 10/19/2016   Cataract of both eyes 07/30/2016   Anemia of chronic disease 09/04/2014   History of DVT (deep vein thrombosis) 08/12/2014   Wound cellulitis 08/12/2014   S/P revision of total knee, 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   Painful total knee replacement 05/31/2014   History of Clostridium difficile colitis 04/09/2014   DOE (dyspnea on exertion) 12/07/2013   Generalized weakness 12/06/2013   Hypokalemia 12/06/2013   SLE (systemic lupus erythematosus) (HCC) 11/13/2013   Asthma 02/14/2012   Rheumatoid arthritis (HCC) 12/17/2011   Polymyalgia rheumatica 12/17/2011   PCP:  Delores Rojelio Caldron, NP Pharmacy:   [phone] - [place],  - [address] AT [facility] OF [address] [place] [address] Phone: [phone] Fax: [phone]     Social Drivers of Health (SDOH) Social History: SDOH Screenings   Food Insecurity: Food Insecurity Present (10/18/2024)  Housing: Low Risk  (10/18/2024)  Transportation Needs: Unmet Transportation Needs (10/18/2024)  Utilities: Not At Risk (10/18/2024)  Depression (PHQ2-9): Medium Risk (07/08/2021)  Financial Resource Strain: Low Risk   (03/25/2023)   Received from [facility]  Physical Activity: Inactive (04/27/2021)   Received from [facility]  Social Connections: Moderately Integrated (10/18/2024)  Stress: No Stress Concern Present (04/27/2021)   Received from [facility]  Tobacco Use: Low Risk  (10/18/2024)   SDOH Interventions:     Readmission Risk Interventions     No data to display

## 2024-10-22 NOTE — Plan of Care (Signed)

## 2024-10-22 NOTE — Progress Notes (Signed)
 [facility]                                                                               Diana Sanchez, is a 68 y.o. female, DOB - [birth year], FMW:[id number] Admit date - 10/18/2024    Outpatient Primary MD for the patient is Delores Rojelio Caldron, NP  LOS - 0  days    Brief summary     68 year old female with history of lupus, PMR, lymphedema, venous stasis with chronic venous stasis ulcer on the left lower leg for many years, follows with plastic surgery at [facility] had skin graft placed 2 weeks ago on 10/14.  Per patient, this was outpatient procedure and did not require Foley catheter.  A week ago she felt fever and chills with myalgias, dysuria, increased urinary frequency.  She has been incontinent of urine for the last week which is not her baseline.  No nausea vomiting but poor appetite.  Has been drinking only liquids.   CT chest abdomen pelvis in ED showed left-sided pyelonephritis and ascending UTI, no renal abscess.  Lymphadenopathy with bilateral axillary regions, abdominal pelvis concerning for lymphoproliferative disorder, no significant change in the abdominal and pelvic adenopathy since prior study.  Cholelithiasis without evidence of cholecystitis.  Assessment & Plan    Assessment and Plan:   Acute left-sided pyelonephritis E. coli urinary tract infection E. coli bacteremia Repeat blood cultures negative so far. Continue with IV Rocephin  2 g daily transition to Ancef  today.   Acute on chronic pain Pain is  better    Chronic left leg wound, chronic venous stasis ulcer S/p skin graft follows with [facility] plastic surgery Wound care on board.    History of PMR, rheumatoid arthritis History of lupus Recommend outpatient follow-up with [facility] rheumatology   Type 2 diabetes mellitus Diet controlled A1c is 5.7   Anemia of chronic disease Hemoglobin stable around 10   Lymphadenopathy in the bilateral axillary area abdomen and pelvis on CT  abdomen Appreciate oncology, Dr. Lonn recommendations, LAD likely reactive secondary to untreated lupus, no indication for biopsy and recommended repeat imaging study in outpatient settings in 3 months.  Will arrange for 1 month follow-up in the clinic.   Constipation Resolved    Estimated body mass index is 26.5 kg/m as calculated from the following:   Height as of this encounter: 5' 11 (1.803 m).   Weight as of this encounter: 86.2 kg.  Code Status: DNR limited.  DVT Prophylaxis:  enoxaparin  (LOVENOX ) injection 40 mg Start: 10/19/24 1600   Level of Care: Level of care: Telemetry Family Communication: none at bedside.   Disposition Plan:     Remains inpatient appropriate:  pending SNF   Procedures:  None.   Consultants:   Oncology.   Antimicrobials:   Anti-infectives (From admission, onward)    Start     Dose/Rate Route Frequency Ordered Stop   10/23/24 0600  ceFAZolin  (ANCEF ) IVPB 2g/100 mL premix        2 g 200 mL/hr over 30 Minutes Intravenous Every 8 hours 10/22/24 1028     10/19/24 0500  cefTRIAXone  (ROCEPHIN ) 2 g in sodium chloride  0.9 % 100 mL IVPB  Status:  Discontinued        2 g 200 mL/hr over 30 Minutes Intravenous Every 24 hours 10/18/24 2135 10/22/24 1028   10/18/24 1815  ceFEPIme (MAXIPIME) 2 g in sodium chloride  0.9 % 100 mL IVPB        2 g 200 mL/hr over 30 Minutes Intravenous  Once 10/18/24 1812 10/18/24 1933        Medications  Scheduled Meds:  acetaminophen   1,000 mg Oral Q8H   ascorbic acid   500 mg Oral Daily   enoxaparin  (LOVENOX ) injection  40 mg Subcutaneous Q24H   feeding supplement  237 mL Oral BID BM   melatonin  5 mg Oral QHS   pantoprazole   40 mg Oral Q0600   polyethylene glycol  17 g Oral Daily   senna-docusate  1 tablet Oral BID   triamcinolone ointment   Topical Once per day on Monday Thursday   Continuous Infusions:  [START ON 10/23/2024]  ceFAZolin  (ANCEF ) IV     PRN Meds:.acetaminophen  **OR** acetaminophen ,  albuterol , bisacodyl, ondansetron  **OR** ondansetron  (ZOFRAN ) IV, oxyCODONE     Subjective:   Diana Sanchez was seen and examined today.  No new complaints. She was concerned that she is still on  observation status.  Objective:   Vitals:   10/21/24 0508 10/21/24 1348 10/21/24 2027 10/22/24 0522  BP: (!) 129/56 125/69 125/67 130/71  Pulse: 73 73 71 69  Resp: 19 20 16 16   Temp: 97.7 F (36.5 C) 97.7 F (36.5 C) 98 F (36.7 C) 97.8 F (36.6 C)  TempSrc:  Oral    SpO2: 100% 94% 100% 100%  Weight:      Height:        Intake/Output Summary (Last 24 hours) at 10/22/2024 1147 Last data filed at 10/22/2024 0643 Gross per 24 hour  Intake 400 ml  Output --  Net 400 ml   Filed Weights   10/18/24 1531  Weight: 86.2 kg     Exam General exam: Appears calm and comfortable  Respiratory system: Clear to auscultation. Respiratory effort normal. Cardiovascular system: S1 & S2 heard, RRR. No JVD, Gastrointestinal system: Abdomen is nondistended, soft and nontender.  Central nervous system: Alert and oriented.  Extremities: No pedal edema. left lower extremity bandaged  Skin: No rashes,  Psychiatry: Mood & affect appropriate.    Data Reviewed:  I have personally reviewed following labs and imaging studies   CBC Lab Results  Component Value Date   WBC 3.9 (L) 10/22/2024   RBC 3.62 (L) 10/22/2024   HGB 9.7 (L) 10/22/2024   HCT 32.2 (L) 10/22/2024   MCV 89.0 10/22/2024   MCH 26.8 10/22/2024   PLT 315 10/22/2024   MCHC 30.1 10/22/2024   RDW 14.8 10/22/2024   LYMPHSABS 1.1 10/18/2024   MONOABS 0.6 10/18/2024   EOSABS 0.0 10/18/2024   BASOSABS 0.0 10/18/2024     Last metabolic panel Lab Results  Component Value Date   NA 135 10/22/2024   K 4.2 10/22/2024   CL 105 10/22/2024   CO2 20 (L) 10/22/2024   BUN 28 (H) 10/22/2024   CREATININE 0.79 10/22/2024   GLUCOSE 86 10/22/2024   GFRNONAA >60 10/22/2024   CALCIUM  9.2 10/22/2024   PHOS 4.0 10/22/2024   PROT 8.5 (H)  10/18/2024   ALBUMIN 3.2 (L) 10/22/2024   BILITOT 0.5 10/18/2024   ALKPHOS 93 10/18/2024   AST 23 10/18/2024   ALT 9 10/18/2024   ANIONGAP 10 10/22/2024    CBG (last 3)  No  results for input(s): GLUCAP in the last 72 hours.    Coagulation Profile: Recent Labs  Lab 10/18/24 1557  INR 1.0     Radiology Studies: No results found.     Elgie Butter M.D. [facility] 10/22/2024, 11:47 AM  Available via Epic secure chat 7am-7pm After 7 pm, please refer to night coverage provider listed on amion.

## 2024-10-22 NOTE — Progress Notes (Signed)
 Occupational Therapy Treatment Patient Details Name: Diana Sanchez MRN: [id number] DOB: [birth year] Today's Date: 10/22/2024   History of present illness Diana Sanchez is a 68 y.o. female CT revealed pyelonephritis PMH: lupus, Polymyalgia rheumatica, lymphedema, calcinosis cutis, venous stasis with a chronic venous stasis ulcer of her left lower leg   OT comments  Patient seen for skilled OT session this afternoon. Progressed ADL's bathroom level and some standing sink side with improved tolerance since initial evaluation. Educated on light UE therex and 5 P's of ECT as outlined below. Discharge rec remains appropriate at this time. Patient requires continued Acute care hospital level OT services to progress safety and functional performance and allow for discharge.        If plan is discharge home, recommend the following:  A lot of help with walking and/or transfers;A lot of help with bathing/dressing/bathroom;Assistance with cooking/housework;Help with stairs or ramp for entrance;Assist for transportation   Equipment Recommendations  None recommended by OT       Precautions / Restrictions Precautions Precautions: Fall Restrictions Weight Bearing Restrictions Per Provider Order: No       Mobility Bed Mobility Overal bed mobility:  (patient up in recliner and remained post session)                  Transfers Overall transfer level: Needs assistance Equipment used: Rolling walker (2 wheels) Transfers: Sit to/from Stand, Bed to chair/wheelchair/BSC Sit to Stand: Contact guard assist, Min assist     Step pivot transfers: Contact guard assist, Min assist     General transfer comment: increased time and support for STS from all surfaces, increased time, relaint on RW     Balance Overall balance assessment: Needs assistance Sitting-balance support: No upper extremity supported, Feet supported Sitting balance-Leahy Scale: Good     Standing  balance support: Reliant on assistive device for balance Standing balance-Leahy Scale: Poor Standing balance comment: LLD on R and flexed hips reliant on device                           ADL either performed or assessed with clinical judgement   ADL Overall ADL's : Needs assistance/impaired Eating/Feeding: Modified independent;Sitting   Grooming: Wash/dry hands;Wash/dry face;Oral care;Applying deodorant;Sitting;Contact guard assist Grooming Details (indicate cue type and reason): stood for oral care sinkside with min A Upper Body Bathing: Set up;Sitting   Lower Body Bathing: Moderate assistance;Sit to/from stand   Upper Body Dressing : Contact guard assist;Sitting   Lower Body Dressing: Moderate assistance;Sit to/from stand   Toilet Transfer: Contact guard assist;Regular Toilet;Minimal assistance;Rolling walker (2 wheels)   Toileting- Clothing Manipulation and Hygiene: Minimal assistance;Sit to/from stand       Functional mobility during ADLs: Contact guard assist;Minimal assistance;Rolling walker (2 wheels) General ADL Comments: continues to present with limited activity tolerance and functional reach    Extremity/Trunk Assessment Upper Extremity Assessment Upper Extremity Assessment: Generalized weakness;Right hand dominant   Lower Extremity Assessment Lower Extremity Assessment: Defer to PT evaluation                 Communication Communication Communication: No apparent difficulties   Cognition Arousal: Alert Behavior During Therapy: WFL for tasks assessed/performed Cognition: No apparent impairments                               Following commands: Intact  Cueing   Cueing Techniques: Verbal cues  Exercises Exercises: Other exercises (issued L1 tband for tricpe press Bly 10 reps x 2 sets)       General Comments L LE skin graft site intact dressing covering, rests needed post short distance amb to and from bathroom to  recliner; 5 P's of ECT Handout issued and introduced    Pertinent Vitals/ Pain       Pain Assessment Pain Assessment: 0-10 Pain Score: 4  Pain Location: abdomen and L LE Pain Descriptors / Indicators: Aching, Burning, Constant, Cramping, Guarding, Sore Pain Intervention(s): Monitored during session, Premedicated before session, Repositioned   Frequency  Min 2X/week        Progress Toward Goals  OT Goals(current goals can now be found in the care plan section)  Progress towards OT goals: Progressing toward goals  Acute Rehab OT Goals Patient Stated Goal: to keep progressing OT Goal Formulation: With patient Time For Goal Achievement: 11/02/24 Potential to Achieve Goals: Good ADL Goals Pt Will Perform Lower Body Bathing: with supervision;with adaptive equipment;sit to/from stand Pt Will Perform Lower Body Dressing: with supervision;with adaptive equipment;sit to/from stand Pt Will Transfer to Toilet: with supervision;ambulating;bedside commode;grab bars Pt Will Perform Toileting - Clothing Manipulation and hygiene: with supervision;sit to/from stand Pt Will Perform Tub/Shower Transfer: with contact guard assist;Shower transfer;ambulating;shower seat Pt/caregiver will Perform Home Exercise Program: Increased strength;Independently;With written HEP provided  Plan         AM-PAC OT 6 Clicks Daily Activity     Outcome Measure   Help from another person eating meals?: None Help from another person taking care of personal grooming?: A Little Help from another person toileting, which includes using toliet, bedpan, or urinal?: A Little Help from another person bathing (including washing, rinsing, drying)?: A Little Help from another person to put on and taking off regular upper body clothing?: A Little Help from another person to put on and taking off regular lower body clothing?: A Lot 6 Click Score: 18    End of Session Equipment Utilized During Treatment: Gait  belt;Rolling walker (2 wheels)  OT Visit Diagnosis: Unsteadiness on feet (R26.81);Muscle weakness (generalized) (M62.81);Pain Pain - Right/Left: Left Pain - part of body: Leg   Activity Tolerance Patient tolerated treatment well   Patient Left in chair;with call bell/phone within reach;with chair alarm set   Nurse Communication Mobility status;Other (comment)        Time: 8499-8454 OT Time Calculation (min): 45 min  Charges: OT General Charges $OT Visit: 1 Visit OT Treatments $Self Care/Home Management : 23-37 mins $Therapeutic Activity: 8-22 mins  Makinna Andy OT/L Acute Rehabilitation Department  [phone]  10/22/2024, 4:41 PM

## 2024-10-22 NOTE — Plan of Care (Signed)
  Problem: Safety: Goal: Ability to remain free from injury will improve Outcome: Progressing   Problem: Pain Managment: Goal: General experience of comfort will improve and/or be controlled Outcome: Progressing   Problem: Elimination: Goal: Will not experience complications related to urinary retention Outcome: Progressing   Problem: Coping: Goal: Level of anxiety will decrease Outcome: Progressing

## 2024-10-23 ENCOUNTER — Other Ambulatory Visit

## 2024-10-23 DIAGNOSIS — R7303 Prediabetes: Secondary | ICD-10-CM

## 2024-10-23 DIAGNOSIS — M329 Systemic lupus erythematosus, unspecified: Secondary | ICD-10-CM

## 2024-10-23 MED ORDER — CEFADROXIL 500 MG PO CAPS
1000.0000 mg | ORAL_CAPSULE | Freq: Two times a day (BID) | ORAL | Status: DC
  Administered 2024-10-23: 1000 mg via ORAL
  Filled 2024-10-23: qty 2

## 2024-10-23 MED ORDER — OXYCODONE HCL 5 MG PO TABS: 5.0000 mg | ORAL_TABLET | Freq: Four times a day (QID) | ORAL | 0 refills | Status: AC | PRN

## 2024-10-23 MED ORDER — CEFADROXIL 500 MG PO CAPS: 1000.0000 mg | ORAL_CAPSULE | Freq: Two times a day (BID) | ORAL | 0 refills | Status: AC

## 2024-10-23 MED ORDER — ACETAMINOPHEN 325 MG PO TABS: 1000.0000 mg | ORAL_TABLET | Freq: Four times a day (QID) | ORAL | Status: AC | PRN

## 2024-10-23 MED ORDER — CEFADROXIL 500 MG PO CAPS
1000.0000 mg | ORAL_CAPSULE | Freq: Two times a day (BID) | ORAL | Status: DC
Start: 2024-10-23 — End: 2024-10-23

## 2024-10-23 MED ORDER — ONDANSETRON HCL 4 MG PO TABS: 4.0000 mg | ORAL_TABLET | Freq: Four times a day (QID) | ORAL | Status: AC | PRN

## 2024-10-23 NOTE — Assessment & Plan Note (Addendum)
 10/23/24 stable.  HgBA1c and mean plasma glucose: Lab Results  Component Value Date/Time   HGBA1C 5.7 (H) 10/19/2024 03:24 AM   MPG 116.89 10/19/2024 03:24 AM

## 2024-10-23 NOTE — Assessment & Plan Note (Addendum)
 10/23/24 change to po Duricef. Received 4 days of IV rocephin . Treat for a total of 10 days with abx. Complete 6 days of po duricef at SNF.

## 2024-10-23 NOTE — Assessment & Plan Note (Addendum)
 10/23/24. 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 Pt is not on long term systemic anticoagulation. Only on ASA 81 mg daily.

## 2024-10-23 NOTE — TOC Progression Note (Addendum)
 Transition of Care [facility]) - Progression Note    Patient Details  Name: Diana Sanchez MRN: [id number] Date of Birth: 1956/07/10  [facility]) CM/SW Contact  Doneta Glenys DASEN, RN Phone Number: 10/23/2024, 8:15 AM  Clinical Narrative:    Choice List Given. Rm 15[address]ana      Service Provider Request Status STAR(s) Address Phone Patient Preferred  [facility] SNF  Accepted 1 [address] [place] [address] [phone]   [facility] Preferred SNF  Accepted 5 [address], [place] [address] [phone]   [facility]  Accepted 4 [address] [place] [address] [phone]   [facility]  Accepted 2 [address] [place] [address] [phone]   [facility]  Accepted 2 [address] [place] [address] [phone]    10:45 AM Patient chose [facility]. [facility] updated on patient choice. Insurance auth started in [place] [id number]  Expected Discharge Plan: Skilled Nursing Facility Barriers to Discharge: Continued Medical Work up, SNF Pending bed offer               Expected Discharge Plan and Services In-house Referral: NA Discharge Planning Services: CM Consult Post Acute Care Choice: NA Living arrangements for the past 2 months: Single Family Home                 DME Arranged: N/A DME Agency: NA       HH Arranged: NA HH Agency: NA         Social Drivers of Health (SDOH) Interventions SDOH Screenings   Food Insecurity: Food Insecurity Present (10/18/2024)  Housing: Low Risk  (10/18/2024)  Transportation Needs: Unmet Transportation Needs (10/18/2024)  Utilities: Not At Risk (10/18/2024)  Depression (PHQ2-9): Medium Risk (07/08/2021)  Financial Resource Strain: Low Risk  (03/25/2023)   Received from [facility]  Physical Activity: Inactive (04/27/2021)   Received from  [facility]  Social Connections: Moderately Integrated (10/18/2024)  Stress: No Stress Concern Present (04/27/2021)   Received from [facility]  Tobacco Use: Low Risk  (10/18/2024)    Readmission Risk Interventions     No data to display

## 2024-10-23 NOTE — Progress Notes (Signed)
 Mobility Specialist - Progress Note   10/23/24 1137  Mobility  Activity Ambulated with assistance  Level of Assistance Minimal assist, patient does 75% or more  Assistive Device Front wheel walker  Distance Ambulated (ft) 80 ft  Range of Motion/Exercises Active  Activity Response Tolerated well  Mobility visit 1 Mobility  Mobility Specialist Start Time (ACUTE ONLY) 1110  Mobility Specialist Stop Time (ACUTE ONLY) 1137  Mobility Specialist Time Calculation (min) (ACUTE ONLY) 27 min   Pt was found in bed and agreeable to mobilize. Assisted to bathroom prior to hallway ambulation and had x1 seated rest break in between. After ambulated ~109ft in hallway and at EOS returned to recliner chair with all needs met. Call bell in reach.   Erminio Leos,  Mobility Specialist Can be reached via Secure Chat

## 2024-10-23 NOTE — Assessment & Plan Note (Addendum)
 10/23/24 change to po Duricef. Received 4 days of IV rocephin . Treat for a total of 10 days with abx. Complete 6 days of po duricef at SNF.  Susceptibility data from last 90 days. Collected Specimen Info Organism AMPICILLIN AMPICILLIN/SULBACTAM CEFAZOLIN  (NON-URINE) CEFAZOLIN  (URINE) CEFEPIME CEFTRIAXONE  Ciprofloxacin Ertapenem Gentamicin Susc lslt Meropenem Nitrofurantoin Susc lslt  10/18/24 Blood from BLOOD RIGHT ARM Escherichia coli  S  S  S   S  S  S  S  S  S   10/18/24 Urine, Clean Catch Escherichia coli  S  S  S  S  S  S  S  S  S  S   Collected Specimen Info Organism Piperacillin  + Tazobactam Trimethoprim/Sulfa  10/18/24 Blood from BLOOD RIGHT ARM Escherichia coli S  S  10/18/24 Urine, Clean Catch Escherichia coli S  S

## 2024-10-23 NOTE — Discharge Summary (Signed)
 [facility] Discharge Summary   Patient name: Kollyns Mickelson  Admit date:     10/18/2024  Discharge date: 10/23/2024  Attending Physician: AKULA, VIJAYA [[id number]]  Discharge Physician: Camellia Door   PCP: Delores Rojelio Jenkins, NP  Admitted From: Home  Disposition:  [facility] SNF  Recommendations for Outpatient Follow-up:  Follow up with PCP in 1-2 weeks Ambulatory referral made to [facility] Rheumatology  Home Health:No Equipment/Devices: None    Discharge Condition:Stable CODE STATUS:DNR/DNI Diet recommendation: Heart Healthy Fluid Restriction: None  Hospital Summary: CC: right calf warmth, fever, chills HPI: Eunique Balik is a 68 y.o. 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  She follows with plastic surgery at [facility] for this.  The patient tells me she had a fish scale skin graft placed the week before last.  Chart review from the plastic surgeons note reports Marigan skin substitute on 10/14 .  The patient says this was an outpatient procedure.  She did not require a Foley catheter and that her wound has been doing beautifully since the procedure.  7 days ago she developed fevers and chills and whole body aches.  She has had dysuria and increased urinary frequency and been incontinent of urine for the last week which is not her baseline. She really has felt terrible and is not wanting to get out of bed.  She denies nausea or vomiting just no appetite.  She is only been drinking liquids.  She does not remember ever having a kidney infection.  She maybe had a UTI when she was a young girl.  She does have pain on her right side.  She did not want to come to the hospital but she was just feeling so bad she really had no choice. In the emergency department the patient's symptoms were  vague but the report of fevers and shaking chills were concerning for infection.  She was afebrile in the ED she did not have an elevated white count but she did have CT scan of her chest and abdomen and pelvis because of her complaints of pain.  The CT revealed pyelonephritis on the right.  Interestingly her UA did not appear infected.  The patient was started on Maxipime initially because of concerns of overall sepsis.  She will be admitted to the hospitalist service for further treatment.  Significant Events: Admitted 10/18/2024 for left side pyelonephritis 10-19-2024 Blood cx growing GNR 10-20-2024 blood cx growing E. Coli 10-21-2024 Blood Cx final MIC showing pansensitive E. Coli.  Admission Labs: Na 134, K 4.2, CO2 of 23, BUN 14, Scr 0.79, glu 100 T. Prot 8.5, AST 23, ALT 9, alk phos 9, alb 3.9. t. Bili 0.5 WBC 9.2, HgB 10.1, plt 307 UA trace LE, negative nitrites, WBC 21-50, few bacteria TSH 0.448 Covid/rsv/flu negative  Admission Imaging Studies: CXR No active disease  CT abd/pelvis, CTPA Findings consistent with left-sided pyelonephritis and ascending urinary tract infection. No evidence of renal abscess. 2. Lymphadenopathy within the bilateral axillary regions, abdomen, and pelvis, concerning for lymphoproliferative disorder. No significant change in the abdominal and pelvic adenopathy since prior study. 3. Cholelithiasis without evidence of cholecystitis. 4. No evidence of pulmonary embolus. 5.  Aortic Atherosclerosis (ICD10-I70.0).  Significant Labs: 10-18-2024 blood cx Positive for E. Coli 10-18-2024 urine cx Positive for E. Coli A1C 5.7%  Significant Imaging Studies:  Antibiotic Therapy: Anti-infectives (From admission, onward)    Start     Dose/Rate Route Frequency Ordered Stop   10/23/24 0600  ceFAZolin  (ANCEF ) IVPB 2g/100 mL premix        2 g 200 mL/hr over 30 Minutes Intravenous Every 8 hours 10/22/24 1028     10/19/24 0500  cefTRIAXone  (ROCEPHIN ) 2 g in sodium  chloride 0.9 % 100 mL IVPB  Status:  Discontinued        2 g 200 mL/hr over 30 Minutes Intravenous Every 24 hours 10/18/24 2135 10/22/24 1028   10/18/24 1815  ceFEPIme (MAXIPIME) 2 g in sodium chloride  0.9 % 100 mL IVPB        2 g 200 mL/hr over 30 Minutes Intravenous  Once 10/18/24 1812 10/18/24 1933       Procedures:   Consultants: Heme/onc   Hospital Course by Problem: * Acute pyelonephritis 10/23/24 change to po Duricef. Received 4 days of IV rocephin . Treat for a total of 10 days with abx. Complete 6 days of po duricef at SNF.   E coli bacteremia 10/23/24 change to po Duricef. Received 4 days of IV rocephin . Treat for a total of 10 days with abx. Complete 6 days of po duricef at SNF.  Susceptibility data from last 90 days. Collected Specimen Info Organism AMPICILLIN AMPICILLIN/SULBACTAM CEFAZOLIN  (NON-URINE) CEFAZOLIN  (URINE) CEFEPIME CEFTRIAXONE  Ciprofloxacin Ertapenem Gentamicin Susc lslt Meropenem Nitrofurantoin Susc lslt  10/18/24 Blood from BLOOD RIGHT ARM Escherichia coli  S  S  S   S  S  S  S  S  S   10/18/24 Urine, Clean Catch Escherichia coli  S  S  S  S  S  S  S  S  S  S   Collected Specimen Info Organism Piperacillin  + Tazobactam Trimethoprim/Sulfa  10/18/24 Blood from BLOOD RIGHT ARM Escherichia coli S  S  10/18/24 Urine, Clean Catch Escherichia coli S  S      Prediabetes 10/23/24 stable.  HgBA1c and mean plasma glucose: Lab Results  Component Value Date/Time   HGBA1C 5.7 (H) 10/19/2024 03:24 AM   MPG 116.89 10/19/2024 03:24 AM      Venous stasis ulcer of left lower leg with edema of left lower leg (HCC) 10/23/24 present on admission. Pt goes to [facility] Wound care clinic. Continue with Unna boots twice a week. Last change was 10-22-2024.     SLE (systemic lupus erythematosus) (HCC) 10/23/24 ambulatory referral to [facility] Rheumatology   Polymyalgia rheumatica 10/23/24 ambulatory referral to [facility] Rheumatology   Rheumatoid arthritis  [facility]) 10/23/24 ambulatory referral to [facility] Rheumatology   History of DVT (deep vein thrombosis) 10/23/24. 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 Pt is not on long term systemic anticoagulation. Only on ASA 81 mg daily.   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   Anemia of chronic disease 10/23/24 Hgb 9.7 g/dl. F/u with PCP    DNR (do not resuscitate)/DNI(Do Not Intubate)     Discharge Diagnoses:  Principal Problem:   Acute pyelonephritis Active Problems:   E coli bacteremia   Anemia of chronic disease   Personal history of MRSA (methicillin resistant Staphylococcus aureus)   History of DVT (deep vein thrombosis)   Rheumatoid arthritis (HCC)   Polymyalgia rheumatica   SLE (systemic lupus erythematosus) (HCC)   Venous stasis ulcer of left lower leg with edema of left lower leg (HCC)  Prediabetes   DNR (do not resuscitate)/DNI(Do Not Intubate)   Discharge Instructions  Discharge Instructions     Ambulatory referral to Rheumatology   Complete by: As directed    Reason for Referral: PMR, lupus  Has the referral been discussed with the patient?: Yes  Designated contact for the referral if not the patient (name/phone number):   Has the patient seen a specialist for this issue before?: No  If so, who (practice/provider)?  Does the patient have a provider or location preference for the referral?: no Would the patient like to see previous specialist if applicable? NA   Call MD for:  difficulty breathing, headache or visual disturbances   Complete by: As directed    Call MD for:  extreme fatigue   Complete by: As directed    Call MD for:  hives   Complete by: As directed    Call MD for:  persistant dizziness or light-headedness   Complete by: As directed    Call MD for:  persistant nausea and vomiting   Complete  by: As directed    Call MD for:  redness, tenderness, or signs of infection (pain, swelling, redness, odor or green/yellow discharge around incision site)   Complete by: As directed    Call MD for:  severe uncontrolled pain   Complete by: As directed    Call MD for:  temperature >100.4   Complete by: As directed    Diet - low sodium heart healthy   Complete by: As directed    Discharge instructions   Complete by: As directed    1. Follow up with your primary care provider in 1-2 weeks following discharge from hospital. 2. Outpatient referral to rheumatology has been made for you.   Discharge wound care:   Complete by: As directed    Wound care: Left lower leg Comments: Cleanse wound with Vashe (lawson # [phone]) allow Vashe moistened gauze to soak in wound bed for 10 minutes.  Apply triamcinolone cream to intact skin of leg. Apply Aquacel AG (lawson # [id number]) to wound bed, cover with silicone foam dressing  and wrap with Kerlix and Coban.  Change twice weekly.   Increase activity slowly   Complete by: As directed       Allergies as of 10/23/2024       Reactions   Tramadol Other (See Comments)   nausea Other reaction(s): Unknown Other Reaction(s): Dizziness, Unknown Other reaction(s): Nausea And Vomiting, nausea, makes a really bad headache too        Medication List     STOP taking these medications    furosemide  40 MG tablet Commonly known as: Lasix    triamcinolone ointment 0.1 % Commonly known as: KENALOG       TAKE these medications    Accu-Chek Guide w/Device Kit by Does not apply route.   acetaminophen  325 MG tablet Commonly known as: TYLENOL  Take 3 tablets (975 mg total) by mouth every 6 (six) hours as needed for mild pain (pain score 1-3), fever or headache (or Fever >/= 101).   albuterol  108 (90 Base) MCG/ACT inhaler Commonly known as: VENTOLIN  HFA Inhale 1-2 puffs into the lungs every 6 (six) hours as needed for wheezing or shortness of breath.    Alcohol Wipes 70 % Pads by Does not apply route.   aspirin  EC 81 MG tablet Take 81 mg by mouth daily.   cefadroxil  500 MG capsule Commonly known as: DURICEF Take 2 capsules (1,000 mg total) by mouth 2 (  two) times daily for 12 doses.   melatonin 5 MG Tabs Take 5 mg by mouth at bedtime.   multivitamin tablet Take 1 tablet by mouth daily.   Narcan  4 MG/0.1ML Liqd nasal spray kit Generic drug: naloxone  Place 1 spray into the nose daily as needed (For overdose).   ondansetron  4 MG tablet Commonly known as: ZOFRAN  Take 1 tablet (4 mg total) by mouth every 6 (six) hours as needed for nausea.   oxyCODONE  5 MG immediate release tablet Commonly known as: Oxy IR/ROXICODONE  Take 1 tablet (5 mg total) by mouth every 6 (six) hours as needed for severe pain (pain score 7-10) or moderate pain (pain score 4-6). What changed:  when to take this reasons to take this   rizatriptan 5 MG tablet Commonly known as: MAXALT Take 10 mg by mouth daily as needed for migraine.   rosuvastatin  20 MG tablet Commonly known as: CRESTOR  Take 20 mg by mouth daily.   VITAMIN C  PO Take 500 mg by mouth daily.               Discharge Care Instructions  (From admission, onward)           Start     Ordered   10/23/24 0000  Discharge wound care:       Comments: Wound care: Left lower leg Comments: Cleanse wound with Vashe (lawson # [phone]) allow Vashe moistened gauze to soak in wound bed for 10 minutes.  Apply triamcinolone cream to intact skin of leg. Apply Aquacel AG (lawson # [id number]) to wound bed, cover with silicone foam dressing  and wrap with Kerlix and Coban.  Change twice weekly.   10/23/24 1131            Contact information for after-discharge care     Destination     [place] [facility] [place], [place] .   Service: Skilled Nursing Contact information: [address] [place]  [address] [phone]                    Allergies  Allergen  Reactions   Tramadol Other (See Comments)    nausea  Other reaction(s): Unknown  Other Reaction(s): Dizziness, Unknown  Other reaction(s): Nausea And Vomiting, nausea, makes a really bad headache too    Discharge Exam: Vitals:   10/22/24 1955 10/23/24 0511  BP: 139/73 139/77  Pulse: 87 67  Resp: 20 20  Temp: 98.3 F (36.8 C) 98.4 F (36.9 C)  SpO2: 100%     Physical Exam Vitals and nursing note reviewed.  Constitutional:      General: She is not in acute distress.    Appearance: She is normal weight. She is not toxic-appearing or diaphoretic.  HENT:     Head: Normocephalic and atraumatic.  Eyes:     General: No scleral icterus. Cardiovascular:     Rate and Rhythm: Normal rate and regular rhythm.     Pulses: Normal pulses.     Heart sounds: Murmur heard.     Comments: Soft II/VI SEM Pulmonary:     Effort: Pulmonary effort is normal. No respiratory distress.     Breath sounds: Normal breath sounds. No wheezing.  Abdominal:     General: Bowel sounds are normal. There is no distension.     Palpations: Abdomen is soft.  Skin:    General: Skin is warm and dry.     Capillary Refill: Capillary refill takes less than 2 seconds.     Comments:  Unna boot on left lower leg  Neurological:     General: No focal deficit present.     Mental Status: She is alert and oriented to person, place, and time.     The results of significant diagnostics from this hospitalization (including imaging, microbiology, ancillary and laboratory) are listed below for reference.    Microbiology: Recent Results (from the past 240 hours)  Blood Culture (routine x 2)     Status: Abnormal   Collection Time: 10/18/24  3:57 PM   Specimen: BLOOD RIGHT ARM  Result Value Ref Range Status   Specimen Description   Final    BLOOD RIGHT ARM Performed at [facility], [address], [place], [place] [address]    Special Requests   Final    BOTTLES DRAWN AEROBIC AND ANAEROBIC Blood Culture  adequate volume Performed at [facility], [address] [place], [place] [address]    Culture  Setup Time   Final    GRAM NEGATIVE RODS AEROBIC BOTTLE ONLY CRITICAL RESULT CALLED TO, READ BACK BY AND VERIFIED WITH: IMELDA EMERSON MILLET [id number] @ 2235 FH Performed at [facility], [address] [place], [place] [address]    Culture ESCHERICHIA COLI (A)  Final   Report Status 10/21/2024 FINAL  Final   Organism ID, Bacteria ESCHERICHIA COLI  Final      Susceptibility   Escherichia coli - MIC*    AMPICILLIN <=2 SENSITIVE Sensitive     CEFAZOLIN  (NON-URINE) <=1 SENSITIVE Sensitive     CEFEPIME <=0.12 SENSITIVE Sensitive     ERTAPENEM <=0.12 SENSITIVE Sensitive     CEFTRIAXONE  <=0.25 SENSITIVE Sensitive     CIPROFLOXACIN <=0.06 SENSITIVE Sensitive     GENTAMICIN <=1 SENSITIVE Sensitive     MEROPENEM <=0.25 SENSITIVE Sensitive     TRIMETH/SULFA <=20 SENSITIVE Sensitive     AMPICILLIN/SULBACTAM <=2 SENSITIVE Sensitive     PIP/TAZO Value in next row Sensitive      <=4 SENSITIVEThis is a modified FDA-approved test that has been validated and its performance characteristics determined by the reporting laboratory.  This laboratory is certified under the Clinical Laboratory Improvement Amendments CLIA as qualified to perform high complexity clinical laboratory testing.    * ESCHERICHIA COLI  Urine Culture     Status: Abnormal   Collection Time: 10/18/24  3:57 PM   Specimen: Urine, Clean Catch  Result Value Ref Range Status   Specimen Description   Final    URINE, CLEAN CATCH Performed at [facility], [address] [place], [place] [address]    Special Requests   Final    NONE Reflexed from [id number] Performed at [facility], [address] [place], [place] [address]    Culture >=100,000 COLONIES/mL ESCHERICHIA COLI (A)  Final   Report Status 10/20/2024 FINAL  Final   Organism ID, Bacteria ESCHERICHIA COLI (A)  Final       Susceptibility   Escherichia coli - MIC*    AMPICILLIN <=2 SENSITIVE Sensitive     CEFAZOLIN  (URINE) Value in next row Sensitive      <=1 SENSITIVEThis is a modified FDA-approved test that has been validated and its performance characteristics determined by the reporting laboratory.  This laboratory is certified under the Clinical Laboratory Improvement Amendments CLIA as qualified to perform high complexity clinical laboratory testing.    CEFEPIME Value in next row Sensitive      <=1 SENSITIVEThis is a modified FDA-approved test that has  been validated and its performance characteristics determined by the reporting laboratory.  This laboratory is certified under the Clinical Laboratory Improvement Amendments CLIA as qualified to perform high complexity clinical laboratory testing.    ERTAPENEM Value in next row Sensitive      <=1 SENSITIVEThis is a modified FDA-approved test that has been validated and its performance characteristics determined by the reporting laboratory.  This laboratory is certified under the Clinical Laboratory Improvement Amendments CLIA as qualified to perform high complexity clinical laboratory testing.    CEFTRIAXONE  Value in next row Sensitive      <=1 SENSITIVEThis is a modified FDA-approved test that has been validated and its performance characteristics determined by the reporting laboratory.  This laboratory is certified under the Clinical Laboratory Improvement Amendments CLIA as qualified to perform high complexity clinical laboratory testing.    CIPROFLOXACIN Value in next row Sensitive      <=1 SENSITIVEThis is a modified FDA-approved test that has been validated and its performance characteristics determined by the reporting laboratory.  This laboratory is certified under the Clinical Laboratory Improvement Amendments CLIA as qualified to perform high complexity clinical laboratory testing.    GENTAMICIN Value in next row Sensitive      <=1 SENSITIVEThis is a  modified FDA-approved test that has been validated and its performance characteristics determined by the reporting laboratory.  This laboratory is certified under the Clinical Laboratory Improvement Amendments CLIA as qualified to perform high complexity clinical laboratory testing.    NITROFURANTOIN Value in next row Sensitive      <=1 SENSITIVEThis is a modified FDA-approved test that has been validated and its performance characteristics determined by the reporting laboratory.  This laboratory is certified under the Clinical Laboratory Improvement Amendments CLIA as qualified to perform high complexity clinical laboratory testing.    TRIMETH/SULFA Value in next row Sensitive      <=1 SENSITIVEThis is a modified FDA-approved test that has been validated and its performance characteristics determined by the reporting laboratory.  This laboratory is certified under the Clinical Laboratory Improvement Amendments CLIA as qualified to perform high complexity clinical laboratory testing.    AMPICILLIN/SULBACTAM Value in next row Sensitive      <=1 SENSITIVEThis is a modified FDA-approved test that has been validated and its performance characteristics determined by the reporting laboratory.  This laboratory is certified under the Clinical Laboratory Improvement Amendments CLIA as qualified to perform high complexity clinical laboratory testing.    PIP/TAZO Value in next row Sensitive      <=4 SENSITIVEThis is a modified FDA-approved test that has been validated and its performance characteristics determined by the reporting laboratory.  This laboratory is certified under the Clinical Laboratory Improvement Amendments CLIA as qualified to perform high complexity clinical laboratory testing.    MEROPENEM Value in next row Sensitive      <=4 SENSITIVEThis is a modified FDA-approved test that has been validated and its performance characteristics determined by the reporting laboratory.  This laboratory is  certified under the Clinical Laboratory Improvement Amendments CLIA as qualified to perform high complexity clinical laboratory testing.    * >=100,000 COLONIES/mL ESCHERICHIA COLI  Blood Culture ID Panel (Reflexed)     Status: Abnormal   Collection Time: 10/18/24  3:57 PM  Result Value Ref Range Status   Enterococcus faecalis NOT DETECTED NOT DETECTED Final   Enterococcus Faecium NOT DETECTED NOT DETECTED Final   Listeria monocytogenes NOT DETECTED NOT DETECTED Final   Staphylococcus species NOT DETECTED NOT DETECTED Final  Staphylococcus aureus (BCID) NOT DETECTED NOT DETECTED Final   Staphylococcus epidermidis NOT DETECTED NOT DETECTED Final   Staphylococcus lugdunensis NOT DETECTED NOT DETECTED Final   Streptococcus species NOT DETECTED NOT DETECTED Final   Streptococcus agalactiae NOT DETECTED NOT DETECTED Final   Streptococcus pneumoniae NOT DETECTED NOT DETECTED Final   Streptococcus pyogenes NOT DETECTED NOT DETECTED Final   A.calcoaceticus-baumannii NOT DETECTED NOT DETECTED Final   Bacteroides fragilis NOT DETECTED NOT DETECTED Final   Enterobacterales DETECTED (A) NOT DETECTED Final    Comment: Enterobacterales represent a large order of gram negative bacteria, not a single organism. CRITICAL RESULT CALLED TO, READ BACK BY AND VERIFIED WITH: PAHRMD EMERSON MILLET [id number] @ 2235 FH    Enterobacter cloacae complex NOT DETECTED NOT DETECTED Final   Escherichia coli DETECTED (A) NOT DETECTED Final    Comment: CRITICAL RESULT CALLED TO, READ BACK BY AND VERIFIED WITH: PAHRMD EMERSON MILLET [id number] @ 2235 FH    Klebsiella aerogenes NOT DETECTED NOT DETECTED Final   Klebsiella oxytoca NOT DETECTED NOT DETECTED Final   Klebsiella pneumoniae NOT DETECTED NOT DETECTED Final   Proteus species NOT DETECTED NOT DETECTED Final   Salmonella species NOT DETECTED NOT DETECTED Final   Serratia marcescens NOT DETECTED NOT DETECTED Final   Haemophilus influenzae NOT DETECTED NOT DETECTED Final    Neisseria meningitidis NOT DETECTED NOT DETECTED Final   Pseudomonas aeruginosa NOT DETECTED NOT DETECTED Final   Stenotrophomonas maltophilia NOT DETECTED NOT DETECTED Final   Candida albicans NOT DETECTED NOT DETECTED Final   Candida auris NOT DETECTED NOT DETECTED Final   Candida glabrata NOT DETECTED NOT DETECTED Final   Candida krusei NOT DETECTED NOT DETECTED Final   Candida parapsilosis NOT DETECTED NOT DETECTED Final   Candida tropicalis NOT DETECTED NOT DETECTED Final   Cryptococcus neoformans/gattii NOT DETECTED NOT DETECTED Final   CTX-M ESBL NOT DETECTED NOT DETECTED Final   Carbapenem resistance IMP NOT DETECTED NOT DETECTED Final   Carbapenem resistance KPC NOT DETECTED NOT DETECTED Final   Carbapenem resistance NDM NOT DETECTED NOT DETECTED Final   Carbapenem resist OXA 48 LIKE NOT DETECTED NOT DETECTED Final   Carbapenem resistance VIM NOT DETECTED NOT DETECTED Final    Comment: Performed at [facility], [address], [place], [place] [address]  Resp panel by RT-PCR (RSV, Flu A&B, Covid) Anterior Nasal Swab     Status: None   Collection Time: 10/18/24  4:55 PM   Specimen: Anterior Nasal Swab  Result Value Ref Range Status   SARS Coronavirus 2 by RT PCR NEGATIVE NEGATIVE Final    Comment: (NOTE) SARS-CoV-2 target nucleic acids are NOT DETECTED.  The SARS-CoV-2 RNA is generally detectable in upper respiratory specimens during the acute phase of infection. The lowest concentration of SARS-CoV-2 viral copies this assay can detect is 138 copies/mL. A negative result does not preclude SARS-Cov-2 infection and should not be used as the sole basis for treatment or other patient management decisions. A negative result may occur with  improper specimen collection/handling, submission of specimen other than nasopharyngeal swab, presence of viral mutation(s) within the areas targeted by this assay, and inadequate number of viral copies(<138 copies/mL). A negative result  must be combined with clinical observations, patient history, and epidemiological information. The expected result is Negative.  Fact Sheet for Patients:  bloggercourse.com  Fact Sheet for Healthcare Providers:  seriousbroker.it  This test is no t yet approved or cleared by the [place]  FDA and  has been  authorized for detection and/or diagnosis of SARS-CoV-2 by FDA under an Emergency Use Authorization (EUA). This EUA will remain  in effect (meaning this test can be used) for the duration of the COVID-19 declaration under Section 564(b)(1) of the Act, 21 U.S.C.section 360bbb-3(b)(1), unless the authorization is terminated  or revoked sooner.       Influenza A by PCR NEGATIVE NEGATIVE Final   Influenza B by PCR NEGATIVE NEGATIVE Final    Comment: (NOTE) The Xpert Xpress SARS-CoV-2/FLU/RSV plus assay is intended as an aid in the diagnosis of influenza from Nasopharyngeal swab specimens and should not be used as a sole basis for treatment. Nasal washings and aspirates are unacceptable for Xpert Xpress SARS-CoV-2/FLU/RSV testing.  Fact Sheet for Patients: bloggercourse.com  Fact Sheet for Healthcare Providers: seriousbroker.it  This test is not yet approved or cleared by the [place]  FDA and has been authorized for detection and/or diagnosis of SARS-CoV-2 by FDA under an Emergency Use Authorization (EUA). This EUA will remain in effect (meaning this test can be used) for the duration of the COVID-19 declaration under Section 564(b)(1) of the Act, 21 U.S.C. section 360bbb-3(b)(1), unless the authorization is terminated or revoked.     Resp Syncytial Virus by PCR NEGATIVE NEGATIVE Final    Comment: (NOTE) Fact Sheet for Patients: bloggercourse.com  Fact Sheet for Healthcare Providers: seriousbroker.it  This test is  not yet approved or cleared by the [place]  FDA and has been authorized for detection and/or diagnosis of SARS-CoV-2 by FDA under an Emergency Use Authorization (EUA). This EUA will remain in effect (meaning this test can be used) for the duration of the COVID-19 declaration under Section 564(b)(1) of the Act, 21 U.S.C. section 360bbb-3(b)(1), unless the authorization is terminated or revoked.  Performed at [facility], [address] [place], [place] [address]   Blood Culture (routine x 2)     Status: None (Preliminary result)   Collection Time: 10/19/24  3:24 AM   Specimen: BLOOD  Result Value Ref Range Status   Specimen Description   Final    BLOOD BLOOD LEFT HAND Performed at [facility], [address] [place], [place] [address]    Special Requests   Final    Blood Culture results may not be optimal due to an inadequate volume of blood received in culture bottles BOTTLES DRAWN AEROBIC ONLY Performed at [facility], [address], [place], [place] [address]    Culture   Final    NO GROWTH 4 DAYS Performed at [facility], [address], [place], [place] [address]    Report Status PENDING  Incomplete  Culture, blood (Routine X 2) w Reflex to ID Panel     Status: None (Preliminary result)   Collection Time: 10/20/24  1:45 PM   Specimen: BLOOD LEFT ARM  Result Value Ref Range Status   Specimen Description   Final    BLOOD LEFT ARM Performed at [facility], [address] [place], [place] [address]    Special Requests   Final    BOTTLES DRAWN AEROBIC AND ANAEROBIC Blood Culture results may not be optimal due to an inadequate volume of blood received in culture bottles Performed at [facility], [address], [place], [place] [address]    Culture   Final    NO GROWTH 3 DAYS Performed at [facility], [address] [place], [place] [address]    Report Status  PENDING  Incomplete  Culture, blood (Routine X 2) w Reflex to ID Panel     Status: None (Preliminary result)   Collection Time: 10/20/24  1:54 PM   Specimen: BLOOD RIGHT HAND  Result Value Ref Range Status   Specimen Description   Final    BLOOD RIGHT HAND Performed at [facility], [address] [place], [place] [address]    Special Requests   Final    BOTTLES DRAWN AEROBIC ONLY Blood Culture results may not be optimal due to an inadequate volume of blood received in culture bottles Performed at [facility], [address] [place], [place] [address]    Culture   Final    NO GROWTH 3 DAYS Performed at [facility], [address], [place], [place] [address]    Report Status PENDING  Incomplete     Labs: ProBNP, BNP (last 5 results) No results for input(s): PROBNP, BNP in the last 8760 hours. Basic Metabolic Panel: Recent Labs  Lab 10/18/24 1557 10/19/24 0324 10/20/24 0352 10/21/24 0312 10/22/24 0333  NA 134* 132* 132* 133* 135  K 4.2 3.9 3.9 4.1 4.2  CL 99 100 100 102 105  CO2 23 18* 19* 22 20*  GLUCOSE 100* 90 92 104* 86  BUN 14 12 17  30* 28*  CREATININE 0.79 0.77 0.63 1.03* 0.79  CALCIUM  9.9 9.1 8.9 9.1 9.2  PHOS  --   --  3.0 4.1 4.0   Liver Function Tests: Recent Labs  Lab 10/18/24 1557 10/20/24 0352 10/21/24 0312 10/22/24 0333  AST 23  --   --   --   ALT 9  --   --   --   ALKPHOS 93  --   --   --   BILITOT 0.5  --   --   --   PROT 8.5*  --   --   --   ALBUMIN 3.9 3.3* 3.3* 3.2*   CBC: Recent Labs  Lab 10/18/24 1557 10/19/24 0324 10/20/24 0352 10/21/24 0312 10/22/24 0333  WBC 9.2 9.3 6.3 4.7 3.9*  NEUTROABS 7.4  --   --   --   --   HGB 10.1* 10.2* 10.4* 9.6* 9.7*  HCT 31.8* 34.6* 33.6* 32.1* 32.2*  MCV 85.0 88.7 86.2 87.0 89.0  PLT 307 305 250 296 315   Cardiac Enzymes: Recent Labs  Lab 10/18/24 1557  CKTOTAL 50   Urinalysis    Component Value Date/Time   COLORURINE YELLOW 10/18/2024 1557    APPEARANCEUR CLEAR 10/18/2024 1557   LABSPEC 1.011 10/18/2024 1557   PHURINE 8.0 10/18/2024 1557   GLUCOSEU NEGATIVE 10/18/2024 1557   HGBUR NEGATIVE 10/18/2024 1557   BILIRUBINUR NEGATIVE 10/18/2024 1557   KETONESUR NEGATIVE 10/18/2024 1557   PROTEINUR NEGATIVE 10/18/2024 1557   NITRITE NEGATIVE 10/18/2024 1557   LEUKOCYTESUR TRACE (A) 10/18/2024 1557   Sepsis Labs Recent Labs  Lab 10/19/24 0324 10/20/24 0352 10/21/24 0312 10/22/24 0333  WBC 9.3 6.3 4.7 3.9*    Procedures/Studies: CT ABDOMEN PELVIS W CONTRAST Result Date: 10/18/2024 CLINICAL DATA:  Right lower quadrant pain, right upper quadrant pain, fever and chills, questionable sepsis, concern for pulmonary embolus EXAM: CT ANGIOGRAPHY CHEST CT ABDOMEN AND PELVIS WITH CONTRAST TECHNIQUE: Multidetector CT imaging of the chest was performed using the standard protocol during bolus administration of intravenous contrast. Multiplanar CT image reconstructions and MIPs were obtained to evaluate the vascular anatomy. Multidetector CT imaging of the abdomen and pelvis was performed  using the standard protocol during bolus administration of intravenous contrast. RADIATION DOSE REDUCTION: This exam was performed according to the departmental dose-optimization program which includes automated exposure control, adjustment of the mA and/or kV according to patient size and/or use of iterative reconstruction technique. CONTRAST:  OMNIPAQUE  IOHEXOL  350 MG/ML SOLN COMPARISON:  10/18/2024, 03/29/2024 FINDINGS: CTA CHEST FINDINGS Cardiovascular: This is a technically adequate evaluation of the pulmonary vasculature. No filling defects or pulmonary emboli. The heart is unremarkable without pericardial effusion. No evidence of thoracic aortic aneurysm or dissection. Atherosclerosis of the aorta and coronary vasculature. Mediastinum/Nodes: Thyroid, trachea, and esophagus are unremarkable. Bilateral axillary lymphadenopathy, measuring up to 13 mm on the  left and 10 mm on the right. No mediastinal or hilar adenopathy. Lungs/Pleura: No acute airspace disease, effusion, or pneumothorax. The central airways are patent. Musculoskeletal: No acute or destructive bony abnormalities. Reconstructed images demonstrate no additional findings. Review of the MIP images confirms the above findings. CT ABDOMEN and PELVIS FINDINGS Hepatobiliary: Multiple calcified gallstones layer dependently within the gallbladder. No evidence of acute cholecystitis. Stable appearance of the liver. No biliary duct dilation or choledocholithiasis. Pancreas: Unremarkable. No pancreatic ductal dilatation or surrounding inflammatory changes. Spleen: Normal in size without focal abnormality. Adrenals/Urinary Tract: Assessment of the distal ureters and bladder is limited by streak artifact from bilateral hip arthroplasties. The bladder is decompressed. There is fat stranding surrounding the left renal pelvis and proximal left ureter, with proximal left ureteral wall thickening and mucosal hyperenhancement, consistent with urinary tract infection. Slightly heterogeneous enhancement of the left renal parenchyma, most pronounced on delayed imaging, consistent with pyelonephritis. No renal abscess. The right kidney is unremarkable.  The adrenals are normal. Stomach/Bowel: No bowel obstruction or ileus. Normal appendix right lower quadrant. No bowel wall thickening or inflammatory change. Vascular/Lymphatic: Stable atherosclerosis of the abdominal aorta. There is continued lymphadenopathy throughout the abdomen and pelvis, not appreciably changed since prior study. Index right external iliac chain lymph node measures 11 mm image 54/2 and left external iliac chain lymph node 19 mm image 57/2, stable. Reproductive: Uterus and bilateral adnexa are unremarkable. Other: No free fluid or free intraperitoneal gas. Small fat containing umbilical hernia. No bowel herniation. Musculoskeletal: Bilateral hip  arthroplasties. No acute or destructive bony abnormalities. Reconstructed images demonstrate no additional findings. Review of the MIP images confirms the above findings. IMPRESSION: 1. Findings consistent with left-sided pyelonephritis and ascending urinary tract infection. No evidence of renal abscess. 2. Lymphadenopathy within the bilateral axillary regions, abdomen, and pelvis, concerning for lymphoproliferative disorder. No significant change in the abdominal and pelvic adenopathy since prior study. 3. Cholelithiasis without evidence of cholecystitis. 4. No evidence of pulmonary embolus. 5.  Aortic Atherosclerosis (ICD10-I70.0). Electronically Signed   By: Ozell Daring M.D.   On: 10/18/2024 17:44   CT Angio Chest PE W and/or Wo Contrast Result Date: 10/18/2024 CLINICAL DATA:  Right lower quadrant pain, right upper quadrant pain, fever and chills, questionable sepsis, concern for pulmonary embolus EXAM: CT ANGIOGRAPHY CHEST CT ABDOMEN AND PELVIS WITH CONTRAST TECHNIQUE: Multidetector CT imaging of the chest was performed using the standard protocol during bolus administration of intravenous contrast. Multiplanar CT image reconstructions and MIPs were obtained to evaluate the vascular anatomy. Multidetector CT imaging of the abdomen and pelvis was performed using the standard protocol during bolus administration of intravenous contrast. RADIATION DOSE REDUCTION: This exam was performed according to the departmental dose-optimization program which includes automated exposure control, adjustment of the mA and/or kV according to patient size and/or  use of iterative reconstruction technique. CONTRAST:  OMNIPAQUE  IOHEXOL  350 MG/ML SOLN COMPARISON:  10/18/2024, 03/29/2024 FINDINGS: CTA CHEST FINDINGS Cardiovascular: This is a technically adequate evaluation of the pulmonary vasculature. No filling defects or pulmonary emboli. The heart is unremarkable without pericardial effusion. No evidence of thoracic  aortic aneurysm or dissection. Atherosclerosis of the aorta and coronary vasculature. Mediastinum/Nodes: Thyroid, trachea, and esophagus are unremarkable. Bilateral axillary lymphadenopathy, measuring up to 13 mm on the left and 10 mm on the right. No mediastinal or hilar adenopathy. Lungs/Pleura: No acute airspace disease, effusion, or pneumothorax. The central airways are patent. Musculoskeletal: No acute or destructive bony abnormalities. Reconstructed images demonstrate no additional findings. Review of the MIP images confirms the above findings. CT ABDOMEN and PELVIS FINDINGS Hepatobiliary: Multiple calcified gallstones layer dependently within the gallbladder. No evidence of acute cholecystitis. Stable appearance of the liver. No biliary duct dilation or choledocholithiasis. Pancreas: Unremarkable. No pancreatic ductal dilatation or surrounding inflammatory changes. Spleen: Normal in size without focal abnormality. Adrenals/Urinary Tract: Assessment of the distal ureters and bladder is limited by streak artifact from bilateral hip arthroplasties. The bladder is decompressed. There is fat stranding surrounding the left renal pelvis and proximal left ureter, with proximal left ureteral wall thickening and mucosal hyperenhancement, consistent with urinary tract infection. Slightly heterogeneous enhancement of the left renal parenchyma, most pronounced on delayed imaging, consistent with pyelonephritis. No renal abscess. The right kidney is unremarkable.  The adrenals are normal. Stomach/Bowel: No bowel obstruction or ileus. Normal appendix right lower quadrant. No bowel wall thickening or inflammatory change. Vascular/Lymphatic: Stable atherosclerosis of the abdominal aorta. There is continued lymphadenopathy throughout the abdomen and pelvis, not appreciably changed since prior study. Index right external iliac chain lymph node measures 11 mm image 54/2 and left external iliac chain lymph node 19 mm image 57/2,  stable. Reproductive: Uterus and bilateral adnexa are unremarkable. Other: No free fluid or free intraperitoneal gas. Small fat containing umbilical hernia. No bowel herniation. Musculoskeletal: Bilateral hip arthroplasties. No acute or destructive bony abnormalities. Reconstructed images demonstrate no additional findings. Review of the MIP images confirms the above findings. IMPRESSION: 1. Findings consistent with left-sided pyelonephritis and ascending urinary tract infection. No evidence of renal abscess. 2. Lymphadenopathy within the bilateral axillary regions, abdomen, and pelvis, concerning for lymphoproliferative disorder. No significant change in the abdominal and pelvic adenopathy since prior study. 3. Cholelithiasis without evidence of cholecystitis. 4. No evidence of pulmonary embolus. 5.  Aortic Atherosclerosis (ICD10-I70.0). Electronically Signed   By: Ozell Daring M.D.   On: 10/18/2024 17:44   DG Chest Port 1 View Result Date: 10/18/2024 CLINICAL DATA:  Questionable sepsis. EXAM: PORTABLE CHEST 1 VIEW COMPARISON:  Chest radiograph dated 09/07/2022. FINDINGS: No focal consolidation, pleural effusion or pneumothorax. The cardiac silhouette is within normal limits. No acute osseous pathology. IMPRESSION: No active disease. Electronically Signed   By: Vanetta Chou M.D.   On: 10/18/2024 16:43    Time coordinating discharge: 60 mins  SIGNED:  Camellia Door, DO [facility] 10/23/24, 11:43 AM

## 2024-10-23 NOTE — TOC Transition Note (Signed)
 Transition of Care [facility]) - Discharge Note   Patient Details  Name: Diana Sanchez MRN: [id number] Date of Birth: May 30, 1956  [facility] CM/SW Contact:  Doneta Glenys DASEN, RN Phone Number: 10/23/2024, 11:53 AM   Clinical Narrative:     Per MD patient ready for DC to [facility] . RN to call report prior to discharge [phone] Rm 120.). RN, patient, and facility notified of DC. Discharge Summary and FL2 sent to facility via HUB. DC packet on chart includes face sheet, medical necessity, DNR and signed prescription. Ambulance [facility] transport requested for patient.  Please consult us  again if new needs arise.     Final next level of care: Skilled Nursing Facility Barriers to Discharge: Barriers Resolved   Patient Goals and CMS Choice Patient states their goals for this hospitalization and ongoing recovery are:: [facility].gov Compare Post Acute Care list provided to:: Patient Choice offered to / list presented to : Patient [place] ownership interest in [facility].provided to:: Patient    Discharge Placement   Existing PASRR number confirmed : 10/23/24          Patient chooses bed at: [facility] Patient to be transferred to facility by: [facility]   Patient and family notified of of transfer: 10/23/24  Discharge Plan and Services Additional resources added to the After Visit Summary for   In-house Referral: NA Discharge Planning Services: CM Consult Post Acute Care Choice: NA          DME Arranged: N/A DME Agency: NA       HH Arranged: NA HH Agency: NA        Social Drivers of Health (SDOH) Interventions SDOH Screenings   Food Insecurity: Food Insecurity Present (10/18/2024)  Housing: Low Risk  (10/18/2024)  Transportation Needs: Unmet Transportation Needs (10/18/2024)  Utilities: Not At Risk (10/18/2024)  Depression (PHQ2-9): Medium Risk (07/08/2021)  Financial Resource Strain: Low Risk  (03/25/2023)    Received from [facility]  Physical Activity: Inactive (04/27/2021)   Received from [facility]  Social Connections: Moderately Integrated (10/18/2024)  Stress: No Stress Concern Present (04/27/2021)   Received from [facility]  Tobacco Use: Low Risk  (10/18/2024)     Readmission Risk Interventions     No data to display

## 2024-10-23 NOTE — Assessment & Plan Note (Deleted)
10/23/24

## 2024-10-23 NOTE — Assessment & Plan Note (Addendum)
 10/23/24 ambulatory referral to [facility]

## 2024-10-23 NOTE — Hospital Course (Signed)
 CC: right calf warmth, fever, chills HPI: Diana Sanchez is a 68 y.o. 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  She follows with plastic surgery at [facility] for this.  The patient tells me she had a fish scale skin graft placed the week before last.  Chart review from the plastic surgeons note reports Marigan skin substitute on 10/14 .  The patient says this was an outpatient procedure.  She did not require a Foley catheter and that her wound has been doing beautifully since the procedure.  7 days ago she developed fevers and chills and whole body aches.  She has had dysuria and increased urinary frequency and been incontinent of urine for the last week which is not her baseline. She really has felt terrible and is not wanting to get out of bed.  She denies nausea or vomiting just no appetite.  She is only been drinking liquids.  She does not remember ever having a kidney infection.  She maybe had a UTI when she was a young girl.  She does have pain on her right side.  She did not want to come to the hospital but she was just feeling so bad she really had no choice. In the emergency department the patient's symptoms were vague but the report of fevers and shaking chills were concerning for infection.  She was afebrile in the ED she did not have an elevated white count but she did have CT scan of her chest and abdomen and pelvis because of her complaints of pain.  The CT revealed pyelonephritis on the right.  Interestingly her UA did not appear infected.  The patient was started on Maxipime initially because of concerns of overall sepsis.  She will be admitted to the hospitalist service for further treatment.  Significant Events: Admitted 10/18/2024 for left side pyelonephritis 10-19-2024 Blood cx growing GNR 10-20-2024 blood cx  growing E. Coli 10-21-2024 Blood Cx final MIC showing pansensitive E. Coli.  Admission Labs: Na 134, K 4.2, CO2 of 23, BUN 14, Scr 0.79, glu 100 T. Prot 8.5, AST 23, ALT 9, alk phos 9, alb 3.9. t. Bili 0.5 WBC 9.2, HgB 10.1, plt 307 UA trace LE, negative nitrites, WBC 21-50, few bacteria TSH 0.448 Covid/rsv/flu negative  Admission Imaging Studies: CXR No active disease  CT abd/pelvis, CTPA Findings consistent with left-sided pyelonephritis and ascending urinary tract infection. No evidence of renal abscess. 2. Lymphadenopathy within the bilateral axillary regions, abdomen, and pelvis, concerning for lymphoproliferative disorder. No significant change in the abdominal and pelvic adenopathy since prior study. 3. Cholelithiasis without evidence of cholecystitis. 4. No evidence of pulmonary embolus. 5.  Aortic Atherosclerosis (ICD10-I70.0).  Significant Labs: 10-18-2024 blood cx Positive for E. Coli 10-18-2024 urine cx Positive for E. Coli A1C 5.7%  Significant Imaging Studies:   Antibiotic Therapy: Anti-infectives (From admission, onward)    Start     Dose/Rate Route Frequency Ordered Stop   10/23/24 0600  ceFAZolin  (ANCEF ) IVPB 2g/100 mL premix        2 g 200 mL/hr over 30 Minutes Intravenous Every 8 hours 10/22/24 1028     10/19/24 0500  cefTRIAXone  (ROCEPHIN ) 2 g in sodium chloride  0.9 % 100 mL IVPB  Status:  Discontinued        2 g 200 mL/hr over 30 Minutes Intravenous Every 24 hours 10/18/24  2135 10/22/24 1028   10/18/24 1815  ceFEPIme (MAXIPIME) 2 g in sodium chloride  0.9 % 100 mL IVPB        2 g 200 mL/hr over 30 Minutes Intravenous  Once 10/18/24 1812 10/18/24 1933       Procedures:   Consultants: Heme/onc

## 2024-10-23 NOTE — Assessment & Plan Note (Addendum)
 10/23/24 chronic.

## 2024-10-23 NOTE — Assessment & Plan Note (Addendum)
 10/23/24 Hgb 9.7 g/dl. F/u with PCP

## 2024-10-23 NOTE — Progress Notes (Signed)
 PROGRESS NOTE    Diana Sanchez  FMW:[id number] DOB: 1956-11-17 DOA: 10/18/2024 PCP: Diana Rojelio Jenkins, NP  Subjective: Pt seen and examined. She feels much better than admission. She said she felt like the walking dead when she was admitted. Now she is eating much better. Sees wound care clinic. Gets Unna boots twice a week. Has SNF bed today. E. Coli sensitive to Ancef . Can change to po duricef to completed treatment for E. Coli septicemia.  Ambulatory referral made to [facility] for patient.  Pt states all her family lives in [place], [place]. She is trying to make arrangements to go live with relative in [place], [place] after she gets out of SNF.   Hospital Course: CC: right calf warmth, fever, chills HPI: Diana Sanchez is a 68 y.o. 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  She follows with plastic surgery at [facility] for this.  The patient tells me she had a fish scale skin graft placed the week before last.  Chart review from the plastic surgeons note reports Marigan skin substitute on 10/14 .  The patient says this was an outpatient procedure.  She did not require a Foley catheter and that her wound has been doing beautifully since the procedure.  7 days ago she developed fevers and chills and whole body aches.  She has had dysuria and increased urinary frequency and been incontinent of urine for the last week which is not her baseline. She really has felt terrible and is not wanting to get out of bed.  She denies nausea or vomiting just no appetite.  She is only been drinking liquids.  She does not remember ever having a kidney infection.  She maybe had a UTI when she was a young girl.  She does have pain on her right side.  She did not want to come to the hospital but she was just feeling so  bad she really had no choice. In the emergency department the patient's symptoms were vague but the report of fevers and shaking chills were concerning for infection.  She was afebrile in the ED she did not have an elevated white count but she did have CT scan of her chest and abdomen and pelvis because of her complaints of pain.  The CT revealed pyelonephritis on the right.  Interestingly her UA did not appear infected.  The patient was started on Maxipime initially because of concerns of overall sepsis.  She will be admitted to the hospitalist service for further treatment.  Significant Events: Admitted 10/18/2024 for left side pyelonephritis 10-19-2024 Blood cx growing GNR 10-20-2024 blood cx growing E. Coli 10-21-2024 Blood Cx final MIC showing pansensitive E. Coli.  Admission Labs: Na 134, K 4.2, CO2 of 23, BUN 14, Scr 0.79, glu 100 T. Prot 8.5, AST 23, ALT 9, alk phos 9, alb 3.9. t. Bili 0.5 WBC 9.2, HgB 10.1, plt 307 UA trace LE, negative nitrites, WBC 21-50, few bacteria TSH 0.448 Covid/rsv/flu negative  Admission Imaging Studies: CXR No active disease  CT abd/pelvis, CTPA Findings consistent with left-sided pyelonephritis and ascending urinary tract infection. No evidence of renal abscess. 2. Lymphadenopathy within the bilateral axillary regions, abdomen, and pelvis, concerning for lymphoproliferative disorder. No significant change in the abdominal and pelvic adenopathy since prior study. 3. Cholelithiasis without evidence of cholecystitis. 4. No evidence of  pulmonary embolus. 5.  Aortic Atherosclerosis (ICD10-I70.0).  Significant Labs: 10-18-2024 blood cx Positive for E. Coli 10-18-2024 urine cx Positive for E. Coli A1C 5.7%  Significant Imaging Studies:   Antibiotic Therapy: Anti-infectives (From admission, onward)    Start     Dose/Rate Route Frequency Ordered Stop   10/23/24 0600  ceFAZolin  (ANCEF ) IVPB 2g/100 mL premix        2 g 200 mL/hr over 30 Minutes Intravenous Every  8 hours 10/22/24 1028     10/19/24 0500  cefTRIAXone  (ROCEPHIN ) 2 g in sodium chloride  0.9 % 100 mL IVPB  Status:  Discontinued        2 g 200 mL/hr over 30 Minutes Intravenous Every 24 hours 10/18/24 2135 10/22/24 1028   10/18/24 1815  ceFEPIme (MAXIPIME) 2 g in sodium chloride  0.9 % 100 mL IVPB        2 g 200 mL/hr over 30 Minutes Intravenous  Once 10/18/24 1812 10/18/24 1933       Procedures:   Consultants: Heme/onc    Assessment and Plan: * Acute pyelonephritis 10/23/24 change to po Duricef. Received 4 days of IV rocephin . Treat for a total of 10 days with abx. Complete 6 days of po duricef at SNF.   E coli bacteremia 10/23/24 change to po Duricef. Received 4 days of IV rocephin . Treat for a total of 10 days with abx. Complete 6 days of po duricef at SNF.  Susceptibility data from last 90 days. Collected Specimen Info Organism AMPICILLIN AMPICILLIN/SULBACTAM CEFAZOLIN  (NON-URINE) CEFAZOLIN  (URINE) CEFEPIME CEFTRIAXONE  Ciprofloxacin Ertapenem Gentamicin Susc lslt Meropenem Nitrofurantoin Susc lslt  10/18/24 Blood from BLOOD RIGHT ARM Escherichia coli  S  S  S   S  S  S  S  S  S   10/18/24 Urine, Clean Catch Escherichia coli  S  S  S  S  S  S  S  S  S  S   Collected Specimen Info Organism Piperacillin  + Tazobactam Trimethoprim/Sulfa  10/18/24 Blood from BLOOD RIGHT ARM Escherichia coli S  S  10/18/24 Urine, Clean Catch Escherichia coli S  S      Prediabetes 10/23/24 stable.  HgBA1c and mean plasma glucose: Lab Results  Component Value Date/Time   HGBA1C 5.7 (H) 10/19/2024 03:24 AM   MPG 116.89 10/19/2024 03:24 AM      Venous stasis ulcer of left lower leg with edema of left lower leg (HCC) 10/23/24 present on admission. Pt goes to [facility]. Continue with Unna boots twice a week. Last change was 10-22-2024.     SLE (systemic lupus erythematosus) (HCC) 10/23/24 ambulatory referral to [facility] Rheumatology   Polymyalgia rheumatica 10/23/24  ambulatory referral to [facility]   Rheumatoid arthritis [facility]) 10/23/24 ambulatory referral to [facility] Rheumatology   History of DVT (deep vein thrombosis) 10/23/24. 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 Pt is not on long term systemic anticoagulation. Only on ASA 81 mg daily.   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   Anemia of chronic disease 10/23/24 Hgb 9.7 g/dl. F/u with PCP    DNR (do not resuscitate)/DNI(Do Not Intubate)    DVT prophylaxis: enoxaparin  (LOVENOX ) injection 40 mg Start: 10/19/24 1600    Code Status: Limited: Do not attempt resuscitation (DNR) -DNR-LIMITED -Do Not Intubate/DNI  Family Communication: no family at bedside. Pt is decisional Disposition Plan: SNF [facility] Reason for continuing  need for hospitalization: stable for DC.  Objective: Vitals:   10/22/24 0522 10/22/24 1216 10/22/24 1955 10/23/24 0511  BP: 130/71 121/79 139/73 139/77  Pulse: 69 74 87 67  Resp: 16 18 20 20   Temp: 97.8 F (36.6 C) 98.1 F (36.7 C) 98.3 F (36.8 C) 98.4 F (36.9 C)  TempSrc:  Oral Oral Oral  SpO2: 100%  100%   Weight:      Height:       No intake or output data in the 24 hours ending 10/23/24 1141 Filed Weights   10/18/24 1531  Weight: 86.2 kg    Examination:  Physical Exam Vitals and nursing note reviewed.  Constitutional:      General: She is not in acute distress.    Appearance: She is normal weight. She is not toxic-appearing or diaphoretic.  HENT:     Head: Normocephalic and atraumatic.  Eyes:     General: No scleral icterus. Cardiovascular:     Rate and Rhythm: Normal rate and regular rhythm.     Pulses: Normal pulses.     Heart sounds: Murmur heard.     Comments: Soft II/VI SEM Pulmonary:     Effort: Pulmonary effort is normal. No respiratory distress.     Breath sounds:  Normal breath sounds. No wheezing.  Abdominal:     General: Bowel sounds are normal. There is no distension.     Palpations: Abdomen is soft.  Skin:    General: Skin is warm and dry.     Capillary Refill: Capillary refill takes less than 2 seconds.     Comments: Unna boot on left lower leg  Neurological:     General: No focal deficit present.     Mental Status: She is alert and oriented to person, place, and time.     Data Reviewed: I have personally reviewed following labs and imaging studies  CBC: Recent Labs  Lab 10/18/24 1557 10/19/24 0324 10/20/24 0352 10/21/24 0312 10/22/24 0333  WBC 9.2 9.3 6.3 4.7 3.9*  NEUTROABS 7.4  --   --   --   --   HGB 10.1* 10.2* 10.4* 9.6* 9.7*  HCT 31.8* 34.6* 33.6* 32.1* 32.2*  MCV 85.0 88.7 86.2 87.0 89.0  PLT 307 305 250 296 315   Basic Metabolic Panel: Recent Labs  Lab 10/18/24 1557 10/19/24 0324 10/20/24 0352 10/21/24 0312 10/22/24 0333  NA 134* 132* 132* 133* 135  K 4.2 3.9 3.9 4.1 4.2  CL 99 100 100 102 105  CO2 23 18* 19* 22 20*  GLUCOSE 100* 90 92 104* 86  BUN 14 12 17  30* 28*  CREATININE 0.79 0.77 0.63 1.03* 0.79  CALCIUM  9.9 9.1 8.9 9.1 9.2  PHOS  --   --  3.0 4.1 4.0   GFR: Estimated Creatinine Clearance: 82.9 mL/min (by C-G formula based on SCr of 0.79 mg/dL). Liver Function Tests: Recent Labs  Lab 10/18/24 1557 10/20/24 0352 10/21/24 0312 10/22/24 0333  AST 23  --   --   --   ALT 9  --   --   --   ALKPHOS 93  --   --   --   BILITOT 0.5  --   --   --   PROT 8.5*  --   --   --   ALBUMIN 3.9 3.3* 3.3* 3.2*   Coagulation Profile: Recent Labs  Lab 10/18/24 1557  INR 1.0   Cardiac Enzymes: Recent Labs  Lab 10/18/24 1557  CKTOTAL 50  Sepsis Labs: Recent Labs  Lab 10/18/24 1609  LATICACIDVEN 1.2    Recent Results (from the past 240 hours)  Blood Culture (routine x 2)     Status: Abnormal   Collection Time: 10/18/24  3:57 PM   Specimen: BLOOD RIGHT ARM  Result Value Ref Range Status    Specimen Description   Final    BLOOD RIGHT ARM Performed at [facility], [address], [place], [place] [address]    Special Requests   Final    BOTTLES DRAWN AEROBIC AND ANAEROBIC Blood Culture adequate volume Performed at [facility], [address] [place], [place] [address]    Culture  Setup Time   Final    GRAM NEGATIVE RODS AEROBIC BOTTLE ONLY CRITICAL RESULT CALLED TO, READ BACK BY AND VERIFIED WITH: IMELDA EMERSON MILLET [id number] @ 2235 FH Performed at [facility], [address], [place], [place] [address]    Culture ESCHERICHIA COLI (A)  Final   Report Status 10/21/2024 FINAL  Final   Organism ID, Bacteria ESCHERICHIA COLI  Final      Susceptibility   Escherichia coli - MIC*    AMPICILLIN <=2 SENSITIVE Sensitive     CEFAZOLIN  (NON-URINE) <=1 SENSITIVE Sensitive     CEFEPIME <=0.12 SENSITIVE Sensitive     ERTAPENEM <=0.12 SENSITIVE Sensitive     CEFTRIAXONE  <=0.25 SENSITIVE Sensitive     CIPROFLOXACIN <=0.06 SENSITIVE Sensitive     GENTAMICIN <=1 SENSITIVE Sensitive     MEROPENEM <=0.25 SENSITIVE Sensitive     TRIMETH/SULFA <=20 SENSITIVE Sensitive     AMPICILLIN/SULBACTAM <=2 SENSITIVE Sensitive     PIP/TAZO Value in next row Sensitive      <=4 SENSITIVEThis is a modified FDA-approved test that has been validated and its performance characteristics determined by the reporting laboratory.  This laboratory is certified under the Clinical Laboratory Improvement Amendments CLIA as qualified to perform high complexity clinical laboratory testing.    * ESCHERICHIA COLI  Urine Culture     Status: Abnormal   Collection Time: 10/18/24  3:57 PM   Specimen: Urine, Clean Catch  Result Value Ref Range Status   Specimen Description   Final    URINE, CLEAN CATCH Performed at [facility], [address], [place], [place] [address]    Special Requests   Final    NONE Reflexed from [id number] Performed at [facility], [address], [place], [place] [address]    Culture >=100,000 COLONIES/mL ESCHERICHIA COLI (A)  Final   Report Status 10/20/2024 FINAL  Final   Organism ID, Bacteria ESCHERICHIA COLI (A)  Final      Susceptibility   Escherichia coli - MIC*    AMPICILLIN <=2 SENSITIVE Sensitive     CEFAZOLIN  (URINE) Value in next row Sensitive      <=1 SENSITIVEThis is a modified FDA-approved test that has been validated and its performance characteristics determined by the reporting laboratory.  This laboratory is certified under the Clinical Laboratory Improvement Amendments CLIA as qualified to perform high complexity clinical laboratory testing.    CEFEPIME Value in next row Sensitive      <=1 SENSITIVEThis is a modified FDA-approved test that has been validated and its performance characteristics determined by the reporting laboratory.  This laboratory is certified under the Clinical Laboratory Improvement Amendments CLIA as qualified to perform high complexity clinical laboratory testing.    ERTAPENEM Value in next row Sensitive      <=  1 SENSITIVEThis is a modified FDA-approved test that has been validated and its performance characteristics determined by the reporting laboratory.  This laboratory is certified under the Clinical Laboratory Improvement Amendments CLIA as qualified to perform high complexity clinical laboratory testing.    CEFTRIAXONE  Value in next row Sensitive      <=1 SENSITIVEThis is a modified FDA-approved test that has been validated and its performance characteristics determined by the reporting laboratory.  This laboratory is certified under the Clinical Laboratory Improvement Amendments CLIA as qualified to perform high complexity clinical laboratory testing.    CIPROFLOXACIN Value in next row Sensitive      <=1 SENSITIVEThis is a modified FDA-approved test that has been validated and its performance characteristics determined by the reporting laboratory.  This laboratory is certified under  the Clinical Laboratory Improvement Amendments CLIA as qualified to perform high complexity clinical laboratory testing.    GENTAMICIN Value in next row Sensitive      <=1 SENSITIVEThis is a modified FDA-approved test that has been validated and its performance characteristics determined by the reporting laboratory.  This laboratory is certified under the Clinical Laboratory Improvement Amendments CLIA as qualified to perform high complexity clinical laboratory testing.    NITROFURANTOIN Value in next row Sensitive      <=1 SENSITIVEThis is a modified FDA-approved test that has been validated and its performance characteristics determined by the reporting laboratory.  This laboratory is certified under the Clinical Laboratory Improvement Amendments CLIA as qualified to perform high complexity clinical laboratory testing.    TRIMETH/SULFA Value in next row Sensitive      <=1 SENSITIVEThis is a modified FDA-approved test that has been validated and its performance characteristics determined by the reporting laboratory.  This laboratory is certified under the Clinical Laboratory Improvement Amendments CLIA as qualified to perform high complexity clinical laboratory testing.    AMPICILLIN/SULBACTAM Value in next row Sensitive      <=1 SENSITIVEThis is a modified FDA-approved test that has been validated and its performance characteristics determined by the reporting laboratory.  This laboratory is certified under the Clinical Laboratory Improvement Amendments CLIA as qualified to perform high complexity clinical laboratory testing.    PIP/TAZO Value in next row Sensitive      <=4 SENSITIVEThis is a modified FDA-approved test that has been validated and its performance characteristics determined by the reporting laboratory.  This laboratory is certified under the Clinical Laboratory Improvement Amendments CLIA as qualified to perform high complexity clinical laboratory testing.    MEROPENEM Value in next row  Sensitive      <=4 SENSITIVEThis is a modified FDA-approved test that has been validated and its performance characteristics determined by the reporting laboratory.  This laboratory is certified under the Clinical Laboratory Improvement Amendments CLIA as qualified to perform high complexity clinical laboratory testing.    * >=100,000 COLONIES/mL ESCHERICHIA COLI  Blood Culture ID Panel (Reflexed)     Status: Abnormal   Collection Time: 10/18/24  3:57 PM  Result Value Ref Range Status   Enterococcus faecalis NOT DETECTED NOT DETECTED Final   Enterococcus Faecium NOT DETECTED NOT DETECTED Final   Listeria monocytogenes NOT DETECTED NOT DETECTED Final   Staphylococcus species NOT DETECTED NOT DETECTED Final   Staphylococcus aureus (BCID) NOT DETECTED NOT DETECTED Final   Staphylococcus epidermidis NOT DETECTED NOT DETECTED Final   Staphylococcus lugdunensis NOT DETECTED NOT DETECTED Final   Streptococcus species NOT DETECTED NOT DETECTED Final   Streptococcus agalactiae NOT DETECTED NOT DETECTED Final  Streptococcus pneumoniae NOT DETECTED NOT DETECTED Final   Streptococcus pyogenes NOT DETECTED NOT DETECTED Final   A.calcoaceticus-baumannii NOT DETECTED NOT DETECTED Final   Bacteroides fragilis NOT DETECTED NOT DETECTED Final   Enterobacterales DETECTED (A) NOT DETECTED Final    Comment: Enterobacterales represent a large order of gram negative bacteria, not a single organism. CRITICAL RESULT CALLED TO, READ BACK BY AND VERIFIED WITH: PAHRMD EMERSON MILLET [id number] @ 2235 FH    Enterobacter cloacae complex NOT DETECTED NOT DETECTED Final   Escherichia coli DETECTED (A) NOT DETECTED Final    Comment: CRITICAL RESULT CALLED TO, READ BACK BY AND VERIFIED WITH: PAHRMD EMERSON MILLET [id number] @ 2235 FH    Klebsiella aerogenes NOT DETECTED NOT DETECTED Final   Klebsiella oxytoca NOT DETECTED NOT DETECTED Final   Klebsiella pneumoniae NOT DETECTED NOT DETECTED Final   Proteus species NOT DETECTED  NOT DETECTED Final   Salmonella species NOT DETECTED NOT DETECTED Final   Serratia marcescens NOT DETECTED NOT DETECTED Final   Haemophilus influenzae NOT DETECTED NOT DETECTED Final   Neisseria meningitidis NOT DETECTED NOT DETECTED Final   Pseudomonas aeruginosa NOT DETECTED NOT DETECTED Final   Stenotrophomonas maltophilia NOT DETECTED NOT DETECTED Final   Candida albicans NOT DETECTED NOT DETECTED Final   Candida auris NOT DETECTED NOT DETECTED Final   Candida glabrata NOT DETECTED NOT DETECTED Final   Candida krusei NOT DETECTED NOT DETECTED Final   Candida parapsilosis NOT DETECTED NOT DETECTED Final   Candida tropicalis NOT DETECTED NOT DETECTED Final   Cryptococcus neoformans/gattii NOT DETECTED NOT DETECTED Final   CTX-M ESBL NOT DETECTED NOT DETECTED Final   Carbapenem resistance IMP NOT DETECTED NOT DETECTED Final   Carbapenem resistance KPC NOT DETECTED NOT DETECTED Final   Carbapenem resistance NDM NOT DETECTED NOT DETECTED Final   Carbapenem resist OXA 48 LIKE NOT DETECTED NOT DETECTED Final   Carbapenem resistance VIM NOT DETECTED NOT DETECTED Final    Comment: Performed at [facility], [address], [place], [place] [address]  Resp panel by RT-PCR (RSV, Flu A&B, Covid) Anterior Nasal Swab     Status: None   Collection Time: 10/18/24  4:55 PM   Specimen: Anterior Nasal Swab  Result Value Ref Range Status   SARS Coronavirus 2 by RT PCR NEGATIVE NEGATIVE Final    Comment: (NOTE) SARS-CoV-2 target nucleic acids are NOT DETECTED.  The SARS-CoV-2 RNA is generally detectable in upper respiratory specimens during the acute phase of infection. The lowest concentration of SARS-CoV-2 viral copies this assay can detect is 138 copies/mL. A negative result does not preclude SARS-Cov-2 infection and should not be used as the sole basis for treatment or other patient management decisions. A negative result may occur with  improper specimen collection/handling, submission  of specimen other than nasopharyngeal swab, presence of viral mutation(s) within the areas targeted by this assay, and inadequate number of viral copies(<138 copies/mL). A negative result must be combined with clinical observations, patient history, and epidemiological information. The expected result is Negative.  Fact Sheet for Patients:  bloggercourse.com  Fact Sheet for Healthcare Providers:  seriousbroker.it  This test is no t yet approved or cleared by the [place]  FDA and  has been authorized for detection and/or diagnosis of SARS-CoV-2 by FDA under an Emergency Use Authorization (EUA). This EUA will remain  in effect (meaning this test can be used) for the duration of the COVID-19 declaration under Section 564(b)(1) of the Act, 21 U.S.C.section 360bbb-3(b)(1), unless the  authorization is terminated  or revoked sooner.       Influenza A by PCR NEGATIVE NEGATIVE Final   Influenza B by PCR NEGATIVE NEGATIVE Final    Comment: (NOTE) The Xpert Xpress SARS-CoV-2/FLU/RSV plus assay is intended as an aid in the diagnosis of influenza from Nasopharyngeal swab specimens and should not be used as a sole basis for treatment. Nasal washings and aspirates are unacceptable for Xpert Xpress SARS-CoV-2/FLU/RSV testing.  Fact Sheet for Patients: bloggercourse.com  Fact Sheet for Healthcare Providers: seriousbroker.it  This test is not yet approved or cleared by the [place]  FDA and has been authorized for detection and/or diagnosis of SARS-CoV-2 by FDA under an Emergency Use Authorization (EUA). This EUA will remain in effect (meaning this test can be used) for the duration of the COVID-19 declaration under Section 564(b)(1) of the Act, 21 U.S.C. section 360bbb-3(b)(1), unless the authorization is terminated or revoked.     Resp Syncytial Virus by PCR NEGATIVE NEGATIVE  Final    Comment: (NOTE) Fact Sheet for Patients: bloggercourse.com  Fact Sheet for Healthcare Providers: seriousbroker.it  This test is not yet approved or cleared by the [place]  FDA and has been authorized for detection and/or diagnosis of SARS-CoV-2 by FDA under an Emergency Use Authorization (EUA). This EUA will remain in effect (meaning this test can be used) for the duration of the COVID-19 declaration under Section 564(b)(1) of the Act, 21 U.S.C. section 360bbb-3(b)(1), unless the authorization is terminated or revoked.  Performed at [facility], [address], [place], [place] [address]   Blood Culture (routine x 2)     Status: None (Preliminary result)   Collection Time: 10/19/24  3:24 AM   Specimen: BLOOD  Result Value Ref Range Status   Specimen Description   Final    BLOOD BLOOD LEFT HAND Performed at [facility], [address] [place], [place] [address]    Special Requests   Final    Blood Culture results may not be optimal due to an inadequate volume of blood received in culture bottles BOTTLES DRAWN AEROBIC ONLY Performed at [facility], [address] [place], [place] [address]    Culture   Final    NO GROWTH 4 DAYS Performed at [facility], [address] [place], [place] [address]    Report Status PENDING  Incomplete  Culture, blood (Routine X 2) w Reflex to ID Panel     Status: None (Preliminary result)   Collection Time: 10/20/24  1:45 PM   Specimen: BLOOD LEFT ARM  Result Value Ref Range Status   Specimen Description   Final    BLOOD LEFT ARM Performed at [facility], [address] [place], [place] [address]    Special Requests   Final    BOTTLES DRAWN AEROBIC AND ANAEROBIC Blood Culture results may not be optimal due to an inadequate volume of blood received in culture bottles Performed at [facility], [address], [place], [place] [address]    Culture   Final    NO GROWTH 3 DAYS Performed at [facility], [address], [place], [place] [address]    Report Status PENDING  Incomplete  Culture, blood (Routine X 2) w Reflex to ID Panel     Status: None (Preliminary result)   Collection Time: 10/20/24  1:54 PM   Specimen: BLOOD RIGHT HAND  Result Value Ref Range Status   Specimen  Description   Final    BLOOD RIGHT HAND Performed at [facility], [address], [place], [place] [address]    Special Requests   Final    BOTTLES DRAWN AEROBIC ONLY Blood Culture results may not be optimal due to an inadequate volume of blood received in culture bottles Performed at [facility], [address] [address], [place], [place] [address]    Culture   Final    NO GROWTH 3 DAYS Performed at [facility], [address], [place], [place] [address]    Report Status PENDING  Incomplete    Scheduled Meds:  ascorbic acid   500 mg Oral Daily   cefadroxil   1,000 mg Oral BID   enoxaparin  (LOVENOX ) injection  40 mg Subcutaneous Q24H   feeding supplement  237 mL Oral BID BM   melatonin  5 mg Oral QHS   pantoprazole   40 mg Oral Q0600   polyethylene glycol  17 g Oral Daily   senna-docusate  1 tablet Oral BID   triamcinolone ointment   Topical Once per day on Monday Thursday   Continuous Infusions:   LOS: 1 day   Time spent: 60 minutes  Camellia Door, DO  [facility]  10/23/2024, 11:41 AM

## 2024-10-23 NOTE — Assessment & Plan Note (Addendum)
 10/23/24 present on admission. Pt goes to [facility] Wound care clinic. Continue with Unna boots twice a week. Last change was 10-22-2024.

## 2024-10-23 NOTE — Subjective & Objective (Signed)
 Pt seen and examined. She feels much better than admission. She said she felt like the walking dead when she was admitted. Now she is eating much better. Sees wound care clinic. Gets Unna boots twice a week. Has SNF bed today. E. Coli sensitive to Ancef . Can change to po duricef to completed treatment for E. Coli septicemia.  Ambulatory referral made to [facility] Rheumatology for patient.  Pt states all her family lives in [place], [place]. She is trying to make arrangements to go live with relative in [place], [place] after she gets out of SNF.

## 2024-10-23 NOTE — [place] FL2 (Signed)
 [place]  MEDICAID FL2 LEVEL OF CARE FORM     IDENTIFICATION  Patient Name: Diana Sanchez Birthdate: [birth year] Sex: female Admission Date (Current Location): 10/18/2024  [place] Number:  [place]oyd [id number] Facility and Address:  [facility],  [address] 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 03/30/2022   DNR (do not resuscitate)/DNI(Do Not Intubate) 03/30/2022   Lymphedema, not elsewhere classified 12/13/2021   Long term (current) use of oral hypoglycemic drugs 12/13/2021   Bilateral primary osteoarthritis of knee 12/13/2021   Iron deficiency anemia 11/30/2021   Palpitations 08/27/2021   Constipation 05/09/2020   Intrinsic eczema 05/09/2020   Memory changes 05/09/2020   Hypercholesterolemia 03/23/2020   Prediabetes 03/23/2020   Lower abdominal pain 08/29/2019   Left leg swelling 08/27/2019   Abnormal mammogram 08/24/2019   Skin ulcer of left ankle with fat layer exposed (HCC) 07/03/2019   Venous insufficiency 02/20/2019   Venous stasis dermatitis of both lower extremities 02/20/2019   Venous stasis ulcer with varicose veins (HCC) 02/20/2019   Depression 01/26/2019   GERD (gastroesophageal reflux disease) 01/26/2019   Venous stasis ulcer of left lower leg with edema of left lower leg (HCC) 01/10/2019   Bilateral hearing loss 08/01/2018   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 06/26/2018   Personal history of MRSA (methicillin resistant Staphylococcus aureus) 06/13/2018   Migraine without status migrainosus, not intractable 04/13/2017   DDD (degenerative disc disease), lumbar 10/19/2016   Lumbar facet arthropathy 10/19/2016   Osteoarthritis of both knees 10/19/2016   Polyarthropathy, multiple sites 10/19/2016   Chronic pain syndrome 10/19/2016   Cataract of both eyes 07/30/2016   Anemia of chronic disease 09/04/2014   History of DVT (deep vein thrombosis) 08/12/2014   Wound cellulitis 08/12/2014   S/P revision of total knee, 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   Painful total knee replacement 05/31/2014   History of Clostridium difficile colitis 04/09/2014   DOE (dyspnea on exertion) 12/07/2013   Generalized weakness 12/06/2013   Hypokalemia 12/06/2013   SLE (systemic lupus erythematosus) (HCC) 11/13/2013   Asthma 02/14/2012   Rheumatoid arthritis (HCC) 12/17/2011   Polymyalgia rheumatica 12/17/2011    Orientation RESPIRATION BLADDER Height & Weight     Self, Time, Situation, Place  Normal Continent Weight: 86.2 kg Height:  5' 11 (180.3 cm)  BEHAVIORAL SYMPTOMS/MOOD NEUROLOGICAL BOWEL NUTRITION STATUS      Continent Diet  AMBULATORY STATUS COMMUNICATION OF NEEDS Skin   Extensive Assist Verbally Surgical wounds                       Personal Care Assistance Level of Assistance  Bathing, Feeding, Dressing Bathing Assistance: Limited assistance Feeding assistance: Independent Dressing Assistance: Independent     Functional Limitations Info  Sight Sight Info: Impaired        SPECIAL CARE FACTORS FREQUENCY  PT (By licensed PT), OT (By licensed OT)  PT Frequency: 5X weekly OT Frequency: 5X weekly            Contractures Contractures Info: Not present    Additional Factors Info  Code Status, Allergies Code Status Info: DNR - Limited Allergies Info: Tramadol           Current Medications (10/23/2024):  This is the current  hospital active medication list Current Facility-Administered Medications  Medication Dose Route Frequency Provider Last Rate Last Admin   acetaminophen  (TYLENOL ) tablet 650 mg  650 mg Oral Q6H PRN Arthea Child, MD   650 mg at 10/19/24 1649   Or   acetaminophen  (TYLENOL ) suppository 650 mg  650 mg Rectal Q6H PRN Arthea Child, MD       albuterol  (PROVENTIL ) (2.5 MG/3ML) 0.083% nebulizer solution 2.5 mg  2.5 mg Nebulization Q6H PRN Arthea Child, MD       ascorbic acid  (VITAMIN C ) tablet 500 mg  500 mg Oral Daily Claiborne, Claudia, MD   500 mg at 10/23/24 0910   bisacodyl (DULCOLAX) EC tablet 5 mg  5 mg Oral Daily PRN Claiborne, Claudia, MD   5 mg at 10/21/24 0758   cefadroxil  (DURICEF) capsule 1,000 mg  1,000 mg Oral BID Laurence Locus, DO   1,000 mg at 10/23/24 9089   enoxaparin  (LOVENOX ) injection 40 mg  40 mg Subcutaneous Q24H Rai, Ripudeep K, MD   40 mg at 10/22/24 1556   feeding supplement (ENSURE PLUS HIGH PROTEIN) liquid 237 mL  237 mL Oral BID BM Arthea Child, MD   237 mL at 10/23/24 0911   melatonin tablet 5 mg  5 mg Oral QHS Claiborne, Claudia, MD   5 mg at 10/22/24 2116   ondansetron  (ZOFRAN ) tablet 4 mg  4 mg Oral Q6H PRN Arthea Child, MD       Or   ondansetron  (ZOFRAN ) injection 4 mg  4 mg Intravenous Q6H PRN Arthea Child, MD       oxyCODONE  (Oxy IR/ROXICODONE ) immediate release tablet 5 mg  5 mg Oral Q6H PRN Rai, Ripudeep K, MD   5 mg at 10/22/24 0640   pantoprazole  (PROTONIX ) EC tablet 40 mg  40 mg Oral Q0600 Rai, Ripudeep K, MD   40 mg at 10/23/24 0542   polyethylene glycol (MIRALAX  / GLYCOLAX ) packet 17 g  17 g Oral Daily Rai, Ripudeep K, MD   17 g at 10/23/24 0910   senna-docusate (Senokot-S) tablet 1 tablet  1 tablet Oral BID Rai, Ripudeep K, MD   1 tablet at 10/23/24 0910   triamcinolone ointment (KENALOG) 0.1 %   Topical Once per day on Monday Thursday Rai, Nydia POUR, MD   Given by Other at 10/22/24 1000     Discharge Medications: Please  see discharge summary for a list of discharge medications.  Relevant Imaging Results:  Relevant Lab Results:   Additional Information SSN  [id number]  Doneta Glenys DASEN, RN

## 2024-10-24 LAB — CULTURE, BLOOD (ROUTINE X 2): Culture: NO GROWTH

## 2024-10-25 ENCOUNTER — Inpatient Hospital Stay: Admission: RE | Admit: 2024-10-25 | Source: Ambulatory Visit

## 2024-10-25 LAB — CULTURE, BLOOD (ROUTINE X 2)
Culture: NO GROWTH
Culture: NO GROWTH

## 2024-11-12 ENCOUNTER — Other Ambulatory Visit: Payer: Self-pay | Admitting: Hematology and Oncology

## 2024-11-12 DIAGNOSIS — D508 Other iron deficiency anemias: Secondary | ICD-10-CM

## 2024-11-23 ENCOUNTER — Inpatient Hospital Stay: Attending: Internal Medicine

## 2024-11-23 ENCOUNTER — Inpatient Hospital Stay: Admitting: Hematology and Oncology

## 2024-12-20 ENCOUNTER — Other Ambulatory Visit (HOSPITAL_BASED_OUTPATIENT_CLINIC_OR_DEPARTMENT_OTHER): Payer: Self-pay | Admitting: Nurse Practitioner

## 2024-12-20 DIAGNOSIS — E2839 Other primary ovarian failure: Secondary | ICD-10-CM

## 2025-01-08 ENCOUNTER — Ambulatory Visit (INDEPENDENT_AMBULATORY_CARE_PROVIDER_SITE_OTHER): Admitting: Podiatry

## 2025-01-08 DIAGNOSIS — Z91199 Patient's noncompliance with other medical treatment and regimen due to unspecified reason: Secondary | ICD-10-CM

## 2025-01-08 NOTE — Progress Notes (Signed)
 No show for appointment.

## 2025-02-18 ENCOUNTER — Ambulatory Visit: Admitting: Internal Medicine
